# Patient Record
Sex: Female | Born: 1940
Health system: Southern US, Community
[De-identification: ages and names within clinical notes are randomized; demographics above are authoritative.]

## PROBLEM LIST (undated history)

## (undated) DIAGNOSIS — R011 Cardiac murmur, unspecified: Secondary | ICD-10-CM

## (undated) DIAGNOSIS — I2510 Atherosclerotic heart disease of native coronary artery without angina pectoris: Secondary | ICD-10-CM

## (undated) DIAGNOSIS — C801 Malignant (primary) neoplasm, unspecified: Secondary | ICD-10-CM

## (undated) DIAGNOSIS — K219 Gastro-esophageal reflux disease without esophagitis: Secondary | ICD-10-CM

## (undated) DIAGNOSIS — I10 Essential (primary) hypertension: Secondary | ICD-10-CM

## (undated) DIAGNOSIS — E039 Hypothyroidism, unspecified: Secondary | ICD-10-CM

## (undated) DIAGNOSIS — E785 Hyperlipidemia, unspecified: Secondary | ICD-10-CM

## (undated) DIAGNOSIS — M1990 Unspecified osteoarthritis, unspecified site: Secondary | ICD-10-CM

## (undated) HISTORY — DX: Gastro-esophageal reflux disease without esophagitis: K21.9

## (undated) HISTORY — DX: Cardiac murmur, unspecified: R01.1

## (undated) HISTORY — DX: Hyperlipidemia, unspecified: E78.5

## (undated) HISTORY — DX: Malignant (primary) neoplasm, unspecified: C80.1

## (undated) HISTORY — DX: Atherosclerotic heart disease of native coronary artery without angina pectoris: I25.10

## (undated) HISTORY — DX: Unspecified osteoarthritis, unspecified site: M19.90

## (undated) HISTORY — DX: Hypothyroidism, unspecified: E03.9

## (undated) HISTORY — DX: Essential (primary) hypertension: I10

---

## 1998-10-09 HISTORY — PX: EYE SURGERY: SHX253

## 2004-10-09 HISTORY — PX: CATARACT EXTRACTION: SUR2

## 2009-08-18 LAB — HM DEXA SCAN

## 2009-09-08 HISTORY — PX: CARDIAC CATHETERIZATION: SHX172

## 2009-10-06 ENCOUNTER — Inpatient Hospital Stay (HOSPITAL_COMMUNITY): Admission: EM | Admit: 2009-10-06 | Discharge: 2009-10-08 | Payer: Self-pay | Admitting: Emergency Medicine

## 2009-10-06 ENCOUNTER — Ambulatory Visit: Payer: Self-pay | Admitting: Internal Medicine

## 2009-10-06 IMAGING — CR DG CHEST 1V PORT
1 series · 1 of 1 positions shown · non-contrast
Comparison: None

CLINICAL DATA: Chest pain

PORTABLE CHEST - 1 VIEW

[view not recorded]
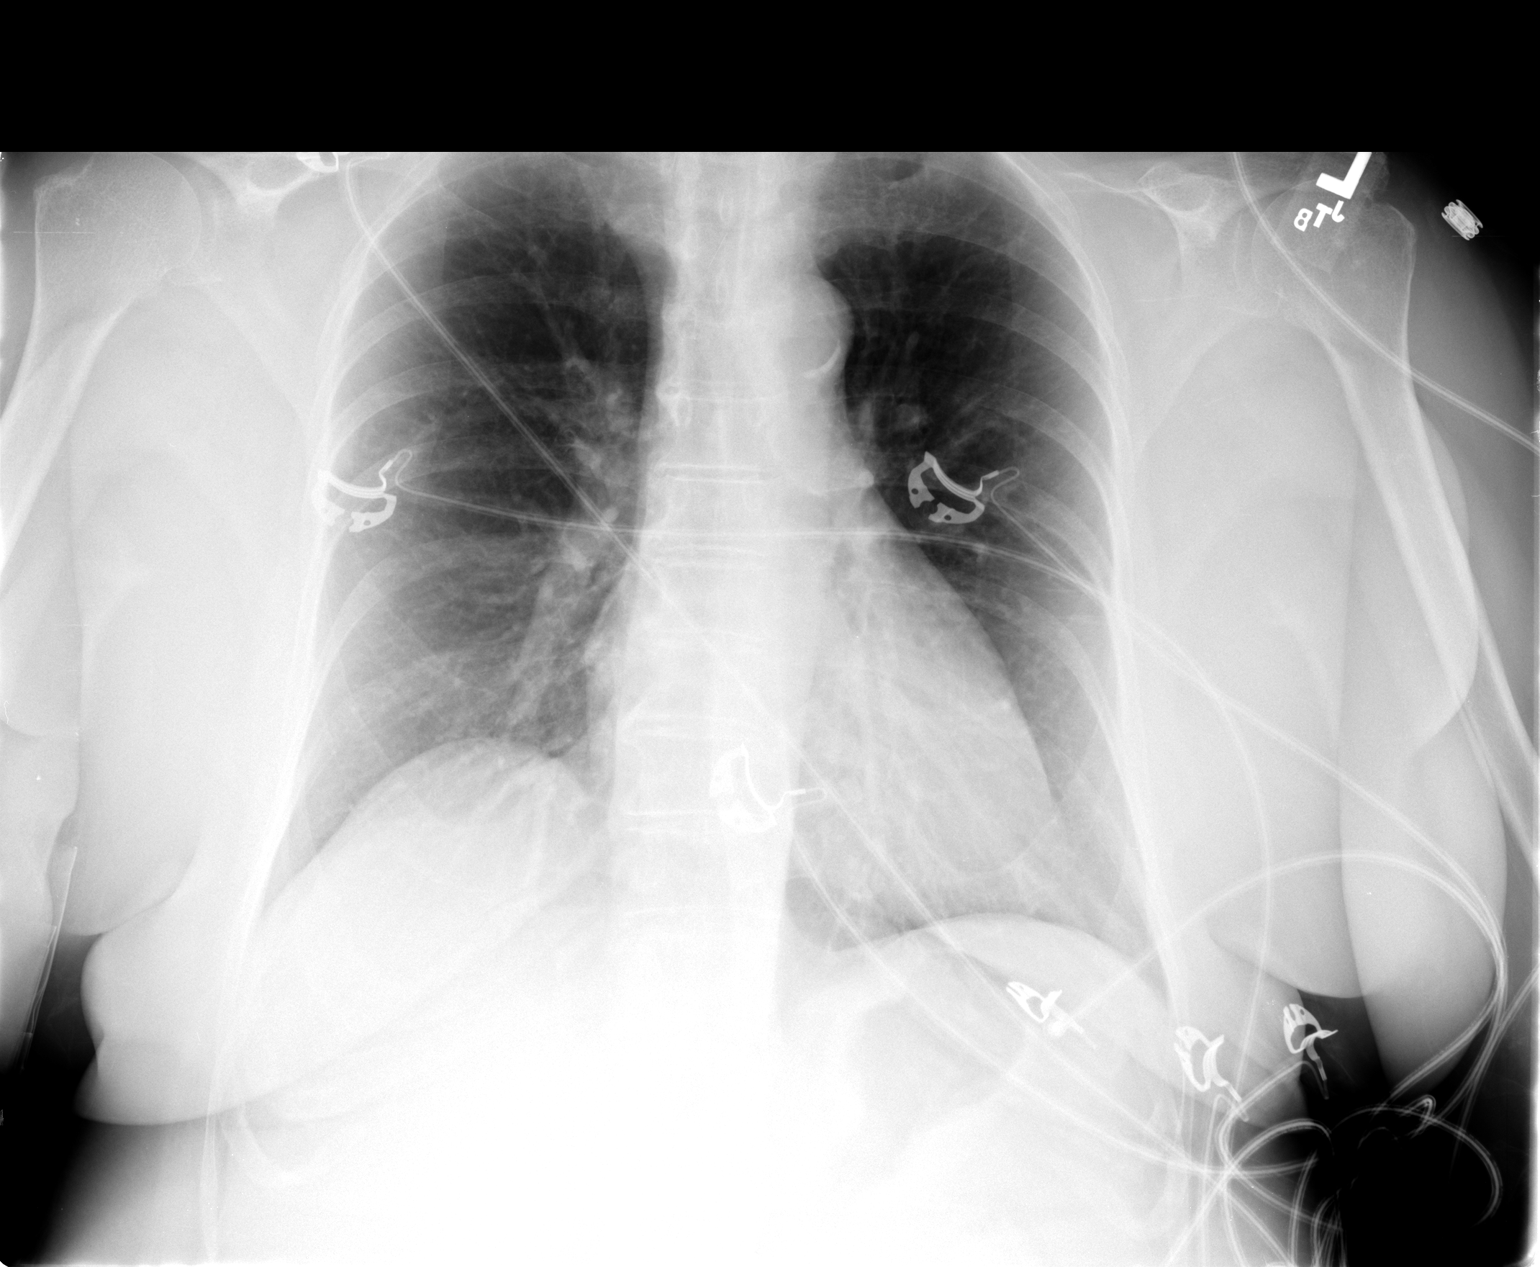

[1 of 1 positions shown; findings below may reference images not displayed]

FINDINGS: Heart and mediastinal contours are within normal limits.
No focal opacities or effusions.  No acute bony abnormality.
IMPRESSION: No active disease.

## 2009-10-07 ENCOUNTER — Encounter: Payer: Self-pay | Admitting: Cardiology

## 2009-10-09 DIAGNOSIS — C801 Malignant (primary) neoplasm, unspecified: Secondary | ICD-10-CM

## 2009-10-09 HISTORY — DX: Malignant (primary) neoplasm, unspecified: C80.1

## 2009-10-25 LAB — HM PAP SMEAR

## 2009-12-07 DIAGNOSIS — E039 Hypothyroidism, unspecified: Secondary | ICD-10-CM | POA: Insufficient documentation

## 2009-12-08 ENCOUNTER — Ambulatory Visit: Payer: Self-pay | Admitting: Cardiology

## 2009-12-08 DIAGNOSIS — E785 Hyperlipidemia, unspecified: Secondary | ICD-10-CM | POA: Insufficient documentation

## 2009-12-08 DIAGNOSIS — I10 Essential (primary) hypertension: Secondary | ICD-10-CM | POA: Insufficient documentation

## 2009-12-08 DIAGNOSIS — I2510 Atherosclerotic heart disease of native coronary artery without angina pectoris: Secondary | ICD-10-CM

## 2010-04-19 HISTORY — PX: BREAST LUMPECTOMY: SHX2

## 2010-11-08 NOTE — Assessment & Plan Note (Signed)
Summary: [facility]   Visit Type:  Follow-up Primary Provider:  Belva Agee, NP  CC:  Nonobstructive CAD.  History of Present Illness: The patient presents for followup after a hospitalization in late last year. She had an abnormal exercise treadmill test. However, catheterization demonstrated only mild nonobstructive disease. Since that time she has had no tarry or vascular complaints. She has had no chest discomfort, neck or arm discomfort. She has had no palpitations, presyncope or syncope. She will get short of breath climbing a flight of stairs but this has been chronic. She doesn't describe any resting shortness of breath and has no PND or orthopnea.  Current Medications (verified): 1)  Synthroid 75 Mcg Tabs (Levothyroxine Sodium) .Marland Kitchen.. 1 By Mouth Daily 2)  Simvastatin 40 Mg Tabs (Simvastatin) .Marland Kitchen.. 1 By Mouth Daily 3)  Verapamil Hcl 120 Mg Tabs (Verapamil Hcl) .Marland Kitchen.. 1 By Mouth Daily 4)  Lisinopril 10 Mg Tabs (Lisinopril) .Marland Kitchen.. 1 By Mouth Daily 5)  Omeprazole 40 Mg Cpdr (Omeprazole) .Marland Kitchen.. 1 By Mouth Daily 6)  Nitrostat 0.4 Mg Subl (Nitroglycerin) .... As Needed 7)  Vitamin D3 1000 Unit Caps (Cholecalciferol) .Marland Kitchen.. 1 By Mouth Daily 8)  Aspirin 325 Mg  Tabs (Aspirin) .Marland Kitchen.. 1 By Mouth Daily 9)  Multivitamins   Tabs (Multiple Vitamin) .Marland Kitchen.. 1 By Mouth Daiy  Allergies (verified): 1)  ! Penicillin 2)  ! Sulfa 3)  ! * Nickle 4)  ! * Latex  Past History:  Past Medical History: Hypothyroidism Non obstructive CAD (cath 12/10) Hyperlipidemia  Hypertension  Past Surgical History: Cataract surgery  Family History:  Mother with "heart problem" age 27s.  Died at age 37.  Father emphysema.      Review of Systems       As stated in the HPI and negative for all other systems.   Vital Signs:  Patient profile:   70 year old female Height:      60 inches Weight:      160 pounds BMI:     31.36 Pulse rate:   69 / minute Resp:     16 per minute BP sitting:   138 / 80  (right  arm)  Vitals Entered By: Marrion Coy, CNA (December 08, 2009 1:12 PM)  Physical Exam  General:  Well developed, well nourished, in no acute distress. Head:  normocephalic and atraumatic Eyes:  PERRLA/EOM intact; conjunctiva and lids normal. Mouth:  Teeth, gums and palate normal. Oral mucosa normal. Neck:  Neck supple, no JVD. No masses, thyromegaly or abnormal cervical nodes. Chest Wall:  no deformities or breast masses noted Lungs:  Clear bilaterally to auscultation and percussion. Abdomen:  Bowel sounds positive; abdomen soft and non-tender without masses, organomegaly, or hernias noted. No hepatosplenomegaly. Msk:  Back normal, normal gait. Muscle strength and tone normal. Extremities:  No clubbing or cyanosis. Neurologic:  Alert and oriented x 3. Skin:  Intact without lesions or rashes. Cervical Nodes:  no significant adenopathy Axillary Nodes:  no significant adenopathy Inguinal Nodes:  no significant adenopathy Psych:  Normal affect.   Detailed Cardiovascular Exam  Neck    Carotids: Carotids full and equal bilaterally without bruits.      Neck Veins: Normal, no JVD.    Heart    Inspection: no deformities or lifts noted.      Palpation: normal PMI with no thrills palpable.      Auscultation: regular rate and rhythm, S1, S2 without murmurs, rubs, gallops, or clicks.    Vascular  Abdominal Aorta: no palpable masses, pulsations, or audible bruits.      Femoral Pulses: normal femoral pulses bilaterally.      Pedal Pulses: normal pedal pulses bilaterally.      Radial Pulses: normal radial pulses bilaterally.      Peripheral Circulation: no clubbing, cyanosis, or edema noted with normal capillary refill.     EKG  Procedure date:  12/08/2009  Findings:      sinus rhythm with sinus arrhythmia, axis within normal limits, intervals within normal limits, no acute ST-T wave changes.  Impression & Recommendations:  Problem # 1:  UNSPECIFIED CIRCULATORY DISEASE  (ICD-V12.50) The patient had some mild nonobstructive coronary disease. At this point only primary risk reduction is indicated. No further testing is needed. We did discuss the need to add walking to her primary prevention strategy.  Problem # 2:  ESSENTIAL HYPERTENSION, BENIGN (ICD-401.1) Her blood pressure is controlled. She will continue the meds as listed.  Problem # 3:  DYSLIPIDEMIA (ICD-272.4) I reviewed her lipid profile from January 17 of this year. Her LDL was only 68 with an HDL of 66. I will defer management to her primary team.

## 2010-11-08 NOTE — Miscellaneous (Signed)
Summary: Orders Update  Clinical Lists Changes  Orders: Added new Service order of EKG w/ Interpretation (93000) - Signed 

## 2010-12-24 DIAGNOSIS — C50919 Malignant neoplasm of unspecified site of unspecified female breast: Secondary | ICD-10-CM | POA: Insufficient documentation

## 2011-01-09 LAB — DIFFERENTIAL
Basophils Relative: 0 % (ref 0–1)
Lymphocytes Relative: 17 % (ref 12–46)
Lymphs Abs: 1.4 10*3/uL (ref 0.7–4.0)
Monocytes Absolute: 0.4 10*3/uL (ref 0.1–1.0)
Monocytes Relative: 4 % (ref 3–12)
Neutro Abs: 6.4 10*3/uL (ref 1.7–7.7)

## 2011-01-09 LAB — LIPID PANEL
Cholesterol: 212 mg/dL — ABNORMAL HIGH (ref 0–200)
HDL: 65 mg/dL (ref >39)
LDL Cholesterol: 127 mg/dL — ABNORMAL HIGH (ref 0–99)
Total CHOL/HDL Ratio: 3.3 RATIO

## 2011-01-09 LAB — CBC
HCT: 38.3 % (ref 36.0–46.0)
Hemoglobin: 12.3 g/dL (ref 12.0–15.0)
Hemoglobin: 14.6 g/dL (ref 12.0–15.0)
MCHC: 34.7 g/dL (ref 30.0–36.0)
MCHC: 35.4 g/dL (ref 30.0–36.0)
MCHC: 35.5 g/dL (ref 30.0–36.0)
MCV: 94.5 fL (ref 78.0–100.0)
MCV: 94.9 fL (ref 78.0–100.0)
MCV: 95.1 fL (ref 78.0–100.0)
Platelets: 241 10*3/uL (ref 150–400)
Platelets: 254 10*3/uL (ref 150–400)
RBC: 3.67 MIL/uL — ABNORMAL LOW (ref 3.87–5.11)
RBC: 4.03 MIL/uL (ref 3.87–5.11)
RBC: 4.41 MIL/uL (ref 3.87–5.11)
RDW: 13.3 % (ref 11.5–15.5)
RDW: 13.5 % (ref 11.5–15.5)
WBC: 10.5 10*3/uL (ref 4.0–10.5)
WBC: 8.3 10*3/uL (ref 4.0–10.5)

## 2011-01-09 LAB — CK TOTAL AND CKMB (NOT AT ARMC): CK, MB: 1.6 ng/mL (ref 0.3–4.0)

## 2011-01-09 LAB — BASIC METABOLIC PANEL
CO2: 27 mEq/L (ref 19–32)
Calcium: 9.2 mg/dL (ref 8.4–10.5)
Chloride: 108 mEq/L (ref 96–112)
Creatinine, Ser: 0.72 mg/dL (ref 0.4–1.2)
GFR calc Af Amer: >60 mL/min (ref >60)
GFR calc non Af Amer: >60 mL/min (ref >60)
Glucose, Bld: 122 mg/dL — ABNORMAL HIGH (ref 70–99)
Sodium: 138 mEq/L (ref 135–145)
Sodium: 142 mEq/L (ref 135–145)

## 2011-01-09 LAB — CARDIAC PANEL(CRET KIN+CKTOT+MB+TROPI)
CK, MB: 1.2 ng/mL (ref 0.3–4.0)
CK, MB: 1.4 ng/mL (ref 0.3–4.0)
Total CK: 43 U/L (ref 7–177)

## 2011-01-09 LAB — URINALYSIS, ROUTINE W REFLEX MICROSCOPIC
Bilirubin Urine: NEGATIVE
Ketones, ur: NEGATIVE mg/dL
Nitrite: NEGATIVE
pH: 7.5 (ref 5.0–8.0)

## 2011-01-09 LAB — HEPARIN LEVEL (UNFRACTIONATED)

## 2011-01-09 LAB — PROTIME-INR
INR: 0.94 (ref 0.00–1.49)
Prothrombin Time: 12.5 seconds (ref 11.6–15.2)

## 2011-01-09 LAB — URINE MICROSCOPIC-ADD ON

## 2011-01-09 LAB — APTT: aPTT: 26 seconds (ref 24–37)

## 2011-05-16 ENCOUNTER — Encounter: Payer: Self-pay | Admitting: Cardiology

## 2011-09-19 DIAGNOSIS — Z86000 Personal history of in-situ neoplasm of breast: Secondary | ICD-10-CM | POA: Insufficient documentation

## 2011-09-19 DIAGNOSIS — D0511 Intraductal carcinoma in situ of right breast: Secondary | ICD-10-CM | POA: Insufficient documentation

## 2012-04-25 ENCOUNTER — Encounter: Payer: Self-pay | Admitting: Internal Medicine

## 2012-06-07 ENCOUNTER — Encounter: Payer: Self-pay | Admitting: Internal Medicine

## 2012-10-22 ENCOUNTER — Encounter: Payer: Self-pay | Admitting: Internal Medicine

## 2012-11-15 ENCOUNTER — Ambulatory Visit (AMBULATORY_SURGERY_CENTER): Payer: Medicare Other | Admitting: *Deleted

## 2012-11-15 VITALS — Ht 60.0 in | Wt 167.0 lb

## 2012-11-15 DIAGNOSIS — Z1211 Encounter for screening for malignant neoplasm of colon: Secondary | ICD-10-CM

## 2012-11-15 MED ORDER — MOVIPREP 100 G PO SOLR: ORAL | Status: DC

## 2012-11-18 ENCOUNTER — Encounter: Payer: Self-pay | Admitting: Internal Medicine

## 2012-11-25 ENCOUNTER — Ambulatory Visit (AMBULATORY_SURGERY_CENTER): Payer: Medicare Other | Admitting: Internal Medicine

## 2012-11-25 ENCOUNTER — Encounter: Payer: Self-pay | Admitting: Internal Medicine

## 2012-11-25 VITALS — BP 153/71 | HR 74 | Temp 97.6°F | Resp 17 | Ht 60.0 in | Wt 167.0 lb

## 2012-11-25 DIAGNOSIS — D126 Benign neoplasm of colon, unspecified: Secondary | ICD-10-CM

## 2012-11-25 DIAGNOSIS — Z1211 Encounter for screening for malignant neoplasm of colon: Secondary | ICD-10-CM

## 2012-11-25 MED ORDER — SODIUM CHLORIDE 0.9 % IV SOLN: 500.0000 mL | INTRAVENOUS | Status: DC

## 2012-11-25 NOTE — Patient Instructions (Addendum)

## 2012-11-25 NOTE — Op Note (Signed)
[facility] [address] [place], [address]   COLONOSCOPY PROCEDURE REPORT  PATIENT: Bonnie Tanner, [facility] Tanner  MR#: [id number] BIRTHDATE: 1941-07-01 , 71  yrs. old GENDER: Female ENDOSCOPIST: Beverley Fiedler, MD REFERRED XB:JYNWG, Donald PROCEDURE DATE:  11/25/2012 PROCEDURE:   Colonoscopy with snare polypectomy ASA CLASS:   Class II INDICATIONS:average risk screening and first colonoscopy. MEDICATIONS: MAC sedation, administered by CRNA and propofol (Diprivan) 400mg  IV  DESCRIPTION OF PROCEDURE:   After the risks benefits and alternatives of the procedure were thoroughly explained, informed consent was obtained.  A digital rectal exam revealed no rectal mass.   The LB [id number]-[id number]  endoscope was introduced through the anus and advanced to the cecum, which was identified by both the appendix and ileocecal valve. No adverse events experienced. The quality of the prep was good, using MoviPrep  The instrument was then slowly withdrawn as the colon was fully examined.  COLON FINDINGS: A sessile polyp measuring 5 mm in size was found in the transverse colon.  A polypectomy was performed with a cold snare.  The resection was complete and the polyp tissue was completely retrieved.   A sessile polyp measuring 8 mm in size was found in the descending colon.  A polypectomy was performed using snare cautery.  The resection was complete and the polyp tissue was completely retrieved.   Moderate diverticulosis was noted in the descending colon and sigmoid colon.  Retroflexed views revealed no abnormalities. The time to cecum=6 minutes 23 seconds.  Withdrawal time=11 minutes 35 seconds.  The scope was withdrawn and the procedure completed. COMPLICATIONS: There were no complications.  ENDOSCOPIC IMPRESSION: 1.   Sessile polyp measuring 5 mm in size was found in the transverse colon; polypectomy was performed with a cold snare 2.   Sessile polyp measuring 8 mm in size was found in  the descending colon; polypectomy was performed using snare cautery 3.   Moderate diverticulosis was noted in the descending colon and sigmoid colon  RECOMMENDATIONS: 1.  Hold aspirin, aspirin products, and anti-inflammatory medication for 1 week. 2.  Await pathology results 3.  High fiber diet 4.  If the polyps removed today are proven to be adenomatous (pre-cancerous) polyps, you will need a repeat colonoscopy in 5 years.  Otherwise you should continue to follow colorectal cancer screening guidelines for "routine risk" patients with colonoscopy in 10 years.  You will receive a letter within 1-2 weeks with the results of your biopsy as well as final recommendations.  Please call my office if you have not received a letter after 3 weeks.  eSigned:  Beverley Fiedler, MD 11/25/2012 2:38 PM      cc: Rudi Heap, MD and The Patient

## 2012-11-25 NOTE — Progress Notes (Signed)
Patient did not experience any of the following events: a burn prior to discharge; a fall within the facility; wrong site/side/patient/procedure/implant event; or a hospital transfer or hospital admission upon discharge from the facility. (G8907) Patient did not have preoperative order for IV antibiotic SSI prophylaxis. ([id number])  

## 2012-11-26 ENCOUNTER — Telehealth: Payer: Self-pay | Admitting: *Deleted

## 2012-11-26 NOTE — Telephone Encounter (Signed)
  Follow up Call-  Call back number 11/25/2012  Post procedure Call Back phone  # [phone]  Permission to leave phone message Yes     Patient questions:  Do you have a fever, pain , or abdominal swelling? no Pain Score  0 *  Have you tolerated food without any problems? yes  Have you been able to return to your normal activities? yes  Do you have any questions about your discharge instructions: Diet   no Medications  no Follow up visit  no  Do you have questions or concerns about your Care? no  Actions: * If pain score is 4 or above: No action needed, pain <4.

## 2012-11-28 ENCOUNTER — Encounter: Payer: Self-pay | Admitting: Internal Medicine

## 2012-11-29 ENCOUNTER — Encounter: Payer: Self-pay | Admitting: Internal Medicine

## 2012-12-20 ENCOUNTER — Encounter: Payer: Self-pay | Admitting: Nurse Practitioner

## 2012-12-24 ENCOUNTER — Telehealth: Payer: Self-pay | Admitting: General Practice

## 2012-12-24 NOTE — Telephone Encounter (Signed)
Still sick, wants Z-Pak

## 2012-12-25 ENCOUNTER — Other Ambulatory Visit: Payer: Self-pay | Admitting: General Practice

## 2012-12-25 ENCOUNTER — Telehealth: Payer: Self-pay | Admitting: General Practice

## 2012-12-25 DIAGNOSIS — J069 Acute upper respiratory infection, unspecified: Secondary | ICD-10-CM

## 2012-12-25 MED ORDER — DOXYCYCLINE HYCLATE 100 MG PO TABS: 100.0000 mg | ORAL_TABLET | Freq: Two times a day (BID) | ORAL | Status: AC

## 2012-12-25 NOTE — Telephone Encounter (Signed)
Doxycycline called into [facility] pharmacy. Will have nurse call and inform patient.

## 2012-12-27 NOTE — Telephone Encounter (Signed)
Pharmacy contacted.

## 2013-02-01 ENCOUNTER — Other Ambulatory Visit: Payer: Self-pay | Admitting: Nurse Practitioner

## 2013-04-30 ENCOUNTER — Other Ambulatory Visit: Payer: Self-pay | Admitting: Nurse Practitioner

## 2013-05-05 ENCOUNTER — Telehealth: Payer: Self-pay | Admitting: Nurse Practitioner

## 2013-05-05 MED ORDER — VERAPAMIL HCL 120 MG PO TABS: 120.0000 mg | ORAL_TABLET | Freq: Every day | ORAL | Status: DC

## 2013-05-05 NOTE — Telephone Encounter (Signed)
rx sent to pharmacy

## 2013-05-05 NOTE — Telephone Encounter (Signed)
Plain verapamil 120. Same that was ordered in april

## 2013-05-05 NOTE — Telephone Encounter (Signed)
Please advise 

## 2013-05-05 NOTE — Telephone Encounter (Signed)
Which verapamil is she on epic has two different ones

## 2013-05-06 NOTE — Telephone Encounter (Signed)
Patient aware rx sent into pharmacy. 

## 2013-06-04 ENCOUNTER — Ambulatory Visit (INDEPENDENT_AMBULATORY_CARE_PROVIDER_SITE_OTHER): Payer: Medicare Other | Admitting: Nurse Practitioner

## 2013-06-04 ENCOUNTER — Encounter: Payer: Self-pay | Admitting: Nurse Practitioner

## 2013-06-04 VITALS — BP 132/71 | HR 69 | Temp 98.1°F | Ht 60.0 in | Wt 165.0 lb

## 2013-06-04 DIAGNOSIS — I10 Essential (primary) hypertension: Secondary | ICD-10-CM

## 2013-06-04 DIAGNOSIS — E785 Hyperlipidemia, unspecified: Secondary | ICD-10-CM

## 2013-06-04 DIAGNOSIS — E079 Disorder of thyroid, unspecified: Secondary | ICD-10-CM

## 2013-06-04 DIAGNOSIS — K219 Gastro-esophageal reflux disease without esophagitis: Secondary | ICD-10-CM

## 2013-06-04 MED ORDER — VERAPAMIL HCL 120 MG PO TABS: 120.0000 mg | ORAL_TABLET | Freq: Every day | ORAL | Status: DC

## 2013-06-04 MED ORDER — LISINOPRIL 40 MG PO TABS: 40.0000 mg | ORAL_TABLET | Freq: Every day | ORAL | Status: DC

## 2013-06-04 MED ORDER — LEVOTHYROXINE SODIUM 75 MCG PO TABS: 75.0000 ug | ORAL_TABLET | Freq: Every day | ORAL | Status: DC

## 2013-06-04 MED ORDER — OMEPRAZOLE 40 MG PO CPDR: 40.0000 mg | DELAYED_RELEASE_CAPSULE | Freq: Every day | ORAL | Status: DC

## 2013-06-04 NOTE — Patient Instructions (Signed)

## 2013-06-04 NOTE — Progress Notes (Signed)
Subjective:    Patient ID: [facility], female    DOB: 1941-01-13, 72 y.o.   MRN: [id number]  Hypertension This is a chronic problem. The current episode started more than 1 year ago. The problem is unchanged. The problem is controlled. Pertinent negatives include no blurred vision, chest pain, headaches, neck pain, orthopnea, palpitations, peripheral edema or shortness of breath. There are no associated agents to hypertension. Risk factors for coronary artery disease include dyslipidemia, family history, obesity and post-menopausal state. Past treatments include ACE inhibitors and beta blockers. The current treatment provides moderate improvement. Compliance problems include exercise and diet.  Hypertensive end-organ damage includes a thyroid problem.  Hyperlipidemia This is a chronic problem. The current episode started more than 1 year ago. The problem is uncontrolled. Recent lipid tests were reviewed and are high. Exacerbating diseases include obesity. She has no history of diabetes or hypothyroidism. There are no known factors aggravating her hyperlipidemia. Pertinent negatives include no chest pain, leg pain, myalgias or shortness of breath. Treatments tried: was on crestor and caused leg pain. The current treatment provides mild improvement of lipids. Compliance problems include adherence to diet and adherence to exercise.  Risk factors for coronary artery disease include family history, hypertension, obesity and post-menopausal.  Thyroid Problem Presents for follow-up (hypothyroidim) visit. Patient reports no cold intolerance, depressed mood, diaphoresis, diarrhea, dry skin, heat intolerance, hoarse voice, palpitations, visual change, weight gain or weight loss. The symptoms have been stable. Her past medical history is significant for hyperlipidemia. There is no history of diabetes.  gerd Omeprazole works well- keeps symptoms under control   Review of Systems  Constitutional: Negative for  weight loss, weight gain and diaphoresis.  HENT: Negative for hoarse voice and neck pain.   Eyes: Negative for blurred vision.  Respiratory: Negative for shortness of breath.   Cardiovascular: Negative for chest pain, palpitations and orthopnea.  Gastrointestinal: Negative for diarrhea.  Endocrine: Negative for cold intolerance and heat intolerance.  Musculoskeletal: Negative for myalgias.  Neurological: Negative for headaches.  All other systems reviewed and are negative.       Objective:   Physical Exam  Constitutional: She is oriented to person, place, and time. She appears well-developed and well-nourished.  HENT:  Nose: Nose normal.  Mouth/Throat: Oropharynx is clear and moist.  Eyes: EOM are normal.  Neck: Trachea normal, normal range of motion and full passive range of motion without pain. Neck supple. No JVD present. Carotid bruit is not present. No thyromegaly present.  Cardiovascular: Normal rate, regular rhythm, normal heart sounds and intact distal pulses.  Exam reveals no gallop and no friction rub.   No murmur heard. Pulmonary/Chest: Effort normal and breath sounds normal.  Abdominal: Soft. Bowel sounds are normal. She exhibits no distension and no mass. There is no tenderness.  Musculoskeletal: Normal range of motion.  Lymphadenopathy:    She has no cervical adenopathy.  Neurological: She is alert and oriented to person, place, and time. She has normal reflexes.  Skin: Skin is warm and dry.  Psychiatric: She has a normal mood and affect. Her behavior is normal. Judgment and thought content normal.   BP 132/71  Pulse 69  Temp(Src) 98.1 F (36.7 C) (Oral)  Ht 5' (1.524 m)  Wt 165 lb (74.844 kg)  BMI 32.22 kg/m2        Assessment & Plan:  1. THYROID DISORDER  - Thyroid Panel With TSH - levothyroxine (SYNTHROID, LEVOTHROID) 75 MCG tablet; Take 1 tablet (75 mcg total) by mouth  daily before breakfast.  Dispense: 90 tablet; Refill: 1  2. DYSLIPIDEMIA Low  fat diet and exercise - NMR, lipoprofile  3. Essential hypertension, benign Low NA diet - CMP14+EGFR - verapamil (CALAN) 120 MG tablet; Take 1 tablet (120 mg total) by mouth daily.  Dispense: 90 tablet; Refill: 1 - lisinopril (PRINIVIL,ZESTRIL) 40 MG tablet; Take 1 tablet (40 mg total) by mouth daily.  Dispense: 90 tablet; Refill: 1  4. GERD (gastroesophageal reflux disease) Avoid spicy and fatty foods - omeprazole (PRILOSEC) 40 MG capsule; Take 1 capsule (40 mg total) by mouth daily.  Dispense: 90 capsule; Refill: 1  heath maintenance reviewed Patient will make appointment for mammogram  Mary-Margaret Daphine Deutscher, FNP

## 2013-06-06 ENCOUNTER — Other Ambulatory Visit: Payer: Self-pay | Admitting: Nurse Practitioner

## 2013-06-06 LAB — CMP14+EGFR
ALT: 26 IU/L (ref 0–32)
AST: 24 IU/L (ref 0–40)
Albumin/Globulin Ratio: 1.8 (ref 1.1–2.5)
Alkaline Phosphatase: 111 IU/L (ref 39–117)
BUN/Creatinine Ratio: 21 (ref 11–26)
Chloride: 100 mmol/L (ref 97–108)
GFR calc Af Amer: 68 mL/min/{1.73_m2} (ref >59)
GFR calc non Af Amer: 59 mL/min/{1.73_m2} — ABNORMAL LOW (ref >59)
Potassium: 4.5 mmol/L (ref 3.5–5.2)
Sodium: 140 mmol/L (ref 134–144)
Total Bilirubin: 0.3 mg/dL (ref 0.0–1.2)

## 2013-06-06 LAB — THYROID PANEL WITH TSH
T4, Total: 10 ug/dL (ref 4.5–12.0)
TSH: 1.46 u[IU]/mL (ref 0.450–4.500)

## 2013-06-06 LAB — NMR, LIPOPROFILE
LDL Size: 21.1 nm (ref >20.5)
LP-IR Score: 37 (ref <=45)
Small LDL Particle Number: 770 nmol/L — ABNORMAL HIGH (ref <=527)
Triglycerides by NMR: 113 mg/dL (ref <150)

## 2013-06-06 MED ORDER — ATORVASTATIN CALCIUM 40 MG PO TABS: 40.0000 mg | ORAL_TABLET | Freq: Every day | ORAL | Status: DC

## 2013-06-11 ENCOUNTER — Telehealth: Payer: Self-pay | Admitting: Nurse Practitioner

## 2013-06-12 NOTE — Telephone Encounter (Signed)
For what- I need to know what for in order to fill

## 2013-06-13 MED ORDER — ERYTHROMYCIN 5 MG/GM OP OINT: TOPICAL_OINTMENT | OPHTHALMIC | Status: DC

## 2013-06-13 NOTE — Telephone Encounter (Signed)
Patient was seen on 11-28-10 for a stye on right eye per chart. Was given erythromycin ointment 2cm ribbon to right eye QID.

## 2013-09-03 ENCOUNTER — Ambulatory Visit (INDEPENDENT_AMBULATORY_CARE_PROVIDER_SITE_OTHER): Payer: Medicare Other | Admitting: General Practice

## 2013-09-03 ENCOUNTER — Telehealth: Payer: Self-pay | Admitting: Nurse Practitioner

## 2013-09-03 VITALS — BP 147/76 | HR 73 | Temp 98.7°F | Wt 167.0 lb

## 2013-09-03 DIAGNOSIS — H0011 Chalazion right upper eyelid: Secondary | ICD-10-CM

## 2013-09-03 DIAGNOSIS — H0019 Chalazion unspecified eye, unspecified eyelid: Secondary | ICD-10-CM

## 2013-09-03 MED ORDER — NEOMYCIN-POLYMYXIN-HC 3.5-10000-1 OP SUSP: 3.0000 [drp] | Freq: Four times a day (QID) | OPHTHALMIC | Status: DC

## 2013-09-03 MED ORDER — BACITRA-NEOMYCIN-POLYMYXIN-HC 1 % OP OINT: 1.0000 "application " | TOPICAL_OINTMENT | Freq: Two times a day (BID) | OPHTHALMIC | Status: AC

## 2013-09-03 NOTE — Telephone Encounter (Signed)
Aware. 

## 2013-09-03 NOTE — Telephone Encounter (Signed)
Pt having reaction to eye ointment Swelling with redness Scheduled appt for pt to be seen

## 2013-09-03 NOTE — Progress Notes (Signed)
   Subjective:    Patient ID: [facility], female    DOB: 07-17-1941, 72 y.o.   MRN: [id number]  HPI Presents today with complaints of right upper eyelid swelling. Reports onset was 2-3 weeks ago. Reports using a medications that was given to her in September (erythromycin OP ointment). Reports eye seemed worse with use of medication. Patient denies being seen prior to use of medication.     Review of Systems  Constitutional: Negative for fever and chills.  Eyes: Negative for photophobia, itching and visual disturbance.       Right upper eyelid swelling  Respiratory: Negative for chest tightness and shortness of breath.   Cardiovascular: Negative for chest pain and palpitations.       Objective:   Physical Exam  Constitutional: She is oriented to person, place, and time. She appears well-developed and well-nourished.  Eyes: EOM are normal. Pupils are equal, round, and reactive to light. Right conjunctiva is not injected. Right conjunctiva has no hemorrhage.  Chalazion noted to right upper eyelid  Cardiovascular: Normal rate, regular rhythm and normal heart sounds.   Pulmonary/Chest: Effort normal and breath sounds normal. No respiratory distress. She exhibits no tenderness.  Neurological: She is alert and oriented to person, place, and time.  Skin: Skin is warm and dry.  Psychiatric: She has a normal mood and affect.          Assessment & Plan:  1. Chalazion of right upper eyelid - bacitracin-neomycin-polymyxin-hydrocortisone (CORTISPORIN) 1 % ophthalmic ointment; Place 1 application into the right eye 2 (two) times daily.  Dispense: 3.5 g; Refill: 0 -Patient to schedule for regular eye exam -Cleanse affected eyelid with baby shampoo -provided and discussed information on Chalazion RTO if symptoms worsen or unresolved, will refer to Optometrist Patient verbalized understanding Coralie Keens, FNP-C

## 2013-09-03 NOTE — Telephone Encounter (Signed)
New x sent to pharmacy

## 2013-09-03 NOTE — Patient Instructions (Signed)
Chalazion  A chalazion is a swelling or hard lump on the eyelid caused by a blocked oil gland. Chalazions may occur on the upper or the lower eyelid.   CAUSES   Oil gland in the eyelid becomes blocked.  SYMPTOMS   · Swelling or hard lump on the eyelid. This lump may make it hard to see out of the eye.  · The swelling may spread to areas around the eye.  TREATMENT   · Although some chalazions disappear by themselves in 1 or 2 months, some chalazions may need to be removed.  · Medicines to treat an infection may be required.  HOME CARE INSTRUCTIONS   · Wash your hands often and dry them with a clean towel. Do not touch the chalazion.  · Apply heat to the eyelid several times a day for 10 minutes to help ease discomfort and bring any yellowish white fluid (pus) to the surface. One way to apply heat to a chalazion is to use the handle of a metal spoon.  · Hold the handle under hot water until it is hot, and then wrap the handle in paper towels so that the heat can come through without burning your skin.  · Hold the wrapped handle against the chalazion and reheat the spoon handle as needed.  · Apply heat in this fashion for 10 minutes, 4 times per day.  · Return to your caregiver to have the pus removed if it does not break (rupture) on its own.  · Do not try to remove the pus yourself by squeezing the chalazion or sticking it with a pin or needle.  · Only take over-the-counter or prescription medicines for pain, discomfort, or fever as directed by your caregiver.  SEEK IMMEDIATE MEDICAL CARE IF:   · You have pain in your eye.  · Your vision changes.  · The chalazion does not go away.  · The chalazion becomes painful, red, or swollen, grows larger, or does not start to disappear after 2 weeks.  MAKE SURE YOU:   · Understand these instructions.  · Will watch your condition.  · Will get help right away if you are not doing well or get worse.  Document Released: 09/22/2000 Document Revised: 12/18/2011 Document Reviewed:  01/10/2010  ExitCare® Patient Information ©2014 [employer].

## 2013-09-08 ENCOUNTER — Ambulatory Visit: Payer: Medicare Other

## 2013-09-15 ENCOUNTER — Ambulatory Visit: Payer: Medicare Other

## 2013-10-06 ENCOUNTER — Encounter: Payer: Self-pay | Admitting: General Practice

## 2013-10-06 ENCOUNTER — Ambulatory Visit (INDEPENDENT_AMBULATORY_CARE_PROVIDER_SITE_OTHER): Payer: Medicare Other | Admitting: General Practice

## 2013-10-06 ENCOUNTER — Ambulatory Visit (INDEPENDENT_AMBULATORY_CARE_PROVIDER_SITE_OTHER): Payer: Medicare Other

## 2013-10-06 ENCOUNTER — Encounter (INDEPENDENT_AMBULATORY_CARE_PROVIDER_SITE_OTHER): Payer: Self-pay

## 2013-10-06 VITALS — BP 153/72 | HR 64 | Temp 97.7°F | Ht 61.0 in | Wt 168.0 lb

## 2013-10-06 DIAGNOSIS — M19011 Primary osteoarthritis, right shoulder: Secondary | ICD-10-CM

## 2013-10-06 DIAGNOSIS — M25511 Pain in right shoulder: Secondary | ICD-10-CM

## 2013-10-06 DIAGNOSIS — M25519 Pain in unspecified shoulder: Secondary | ICD-10-CM

## 2013-10-06 DIAGNOSIS — Z23 Encounter for immunization: Secondary | ICD-10-CM

## 2013-10-06 DIAGNOSIS — M19019 Primary osteoarthritis, unspecified shoulder: Secondary | ICD-10-CM

## 2013-10-06 IMAGING — CR DG SHOULDER 2+V*R*
4 series · 4 of 4 positions shown · non-contrast
Comparison: None.

CLINICAL DATA: Right shoulder pain.

EXAM:
RIGHT SHOULDER - 2+ VIEW

[view not recorded (1 of 4)]
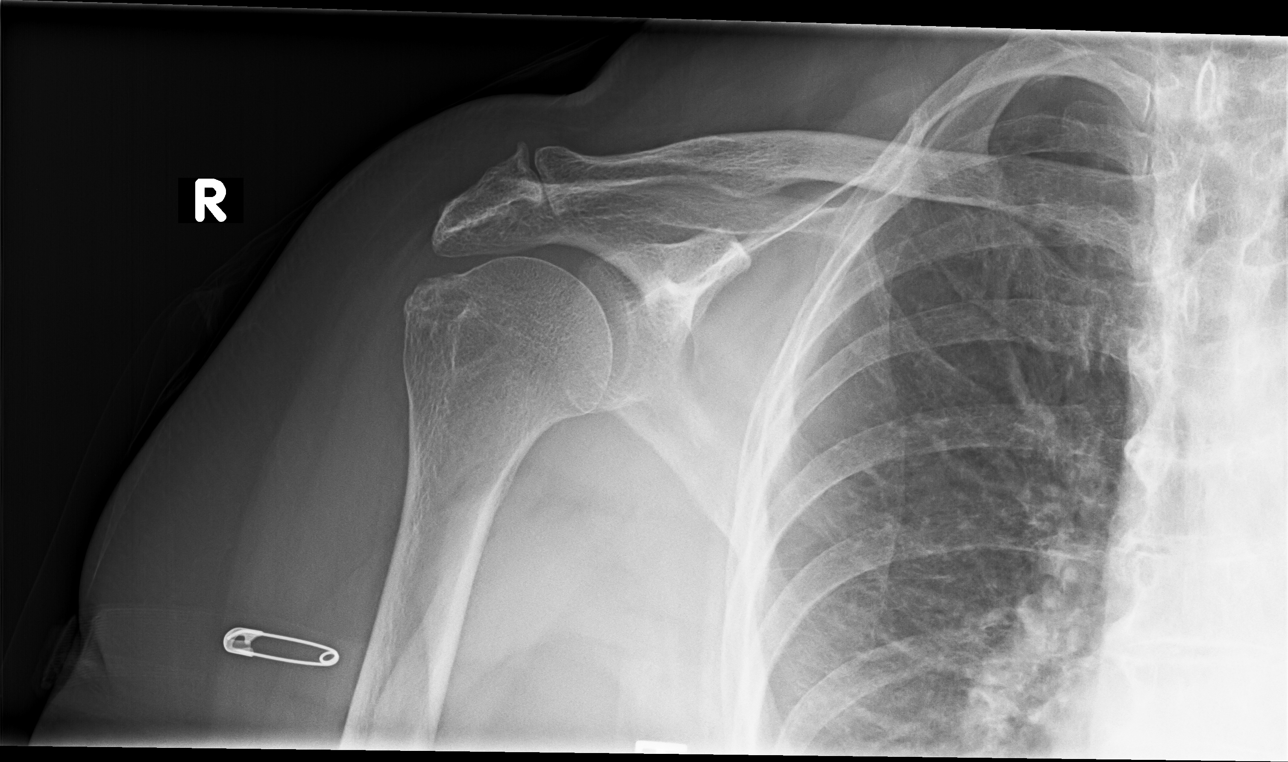

[view not recorded (2 of 4)]
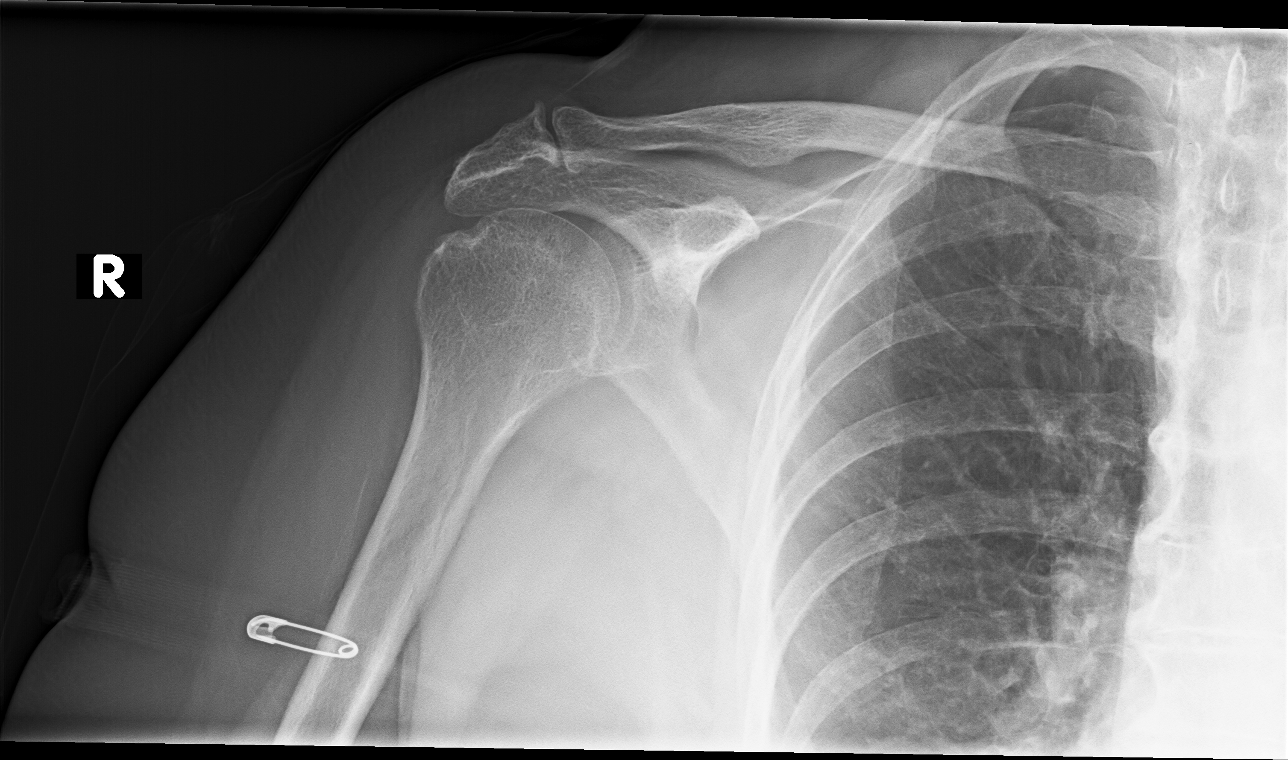

[view not recorded (3 of 4)]
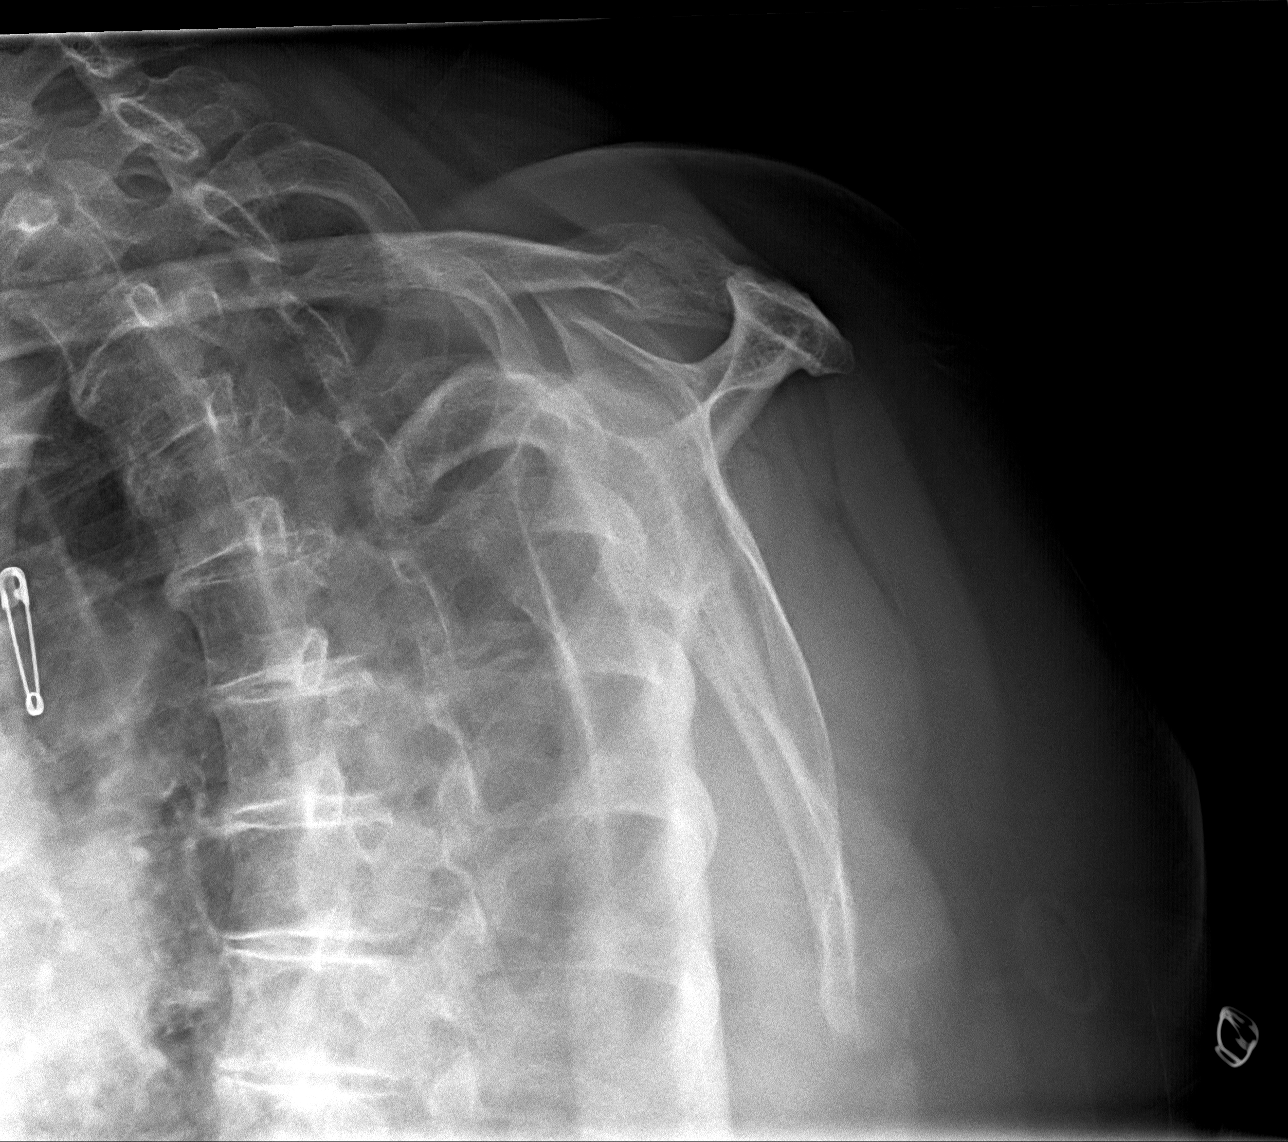

[view not recorded (4 of 4)]
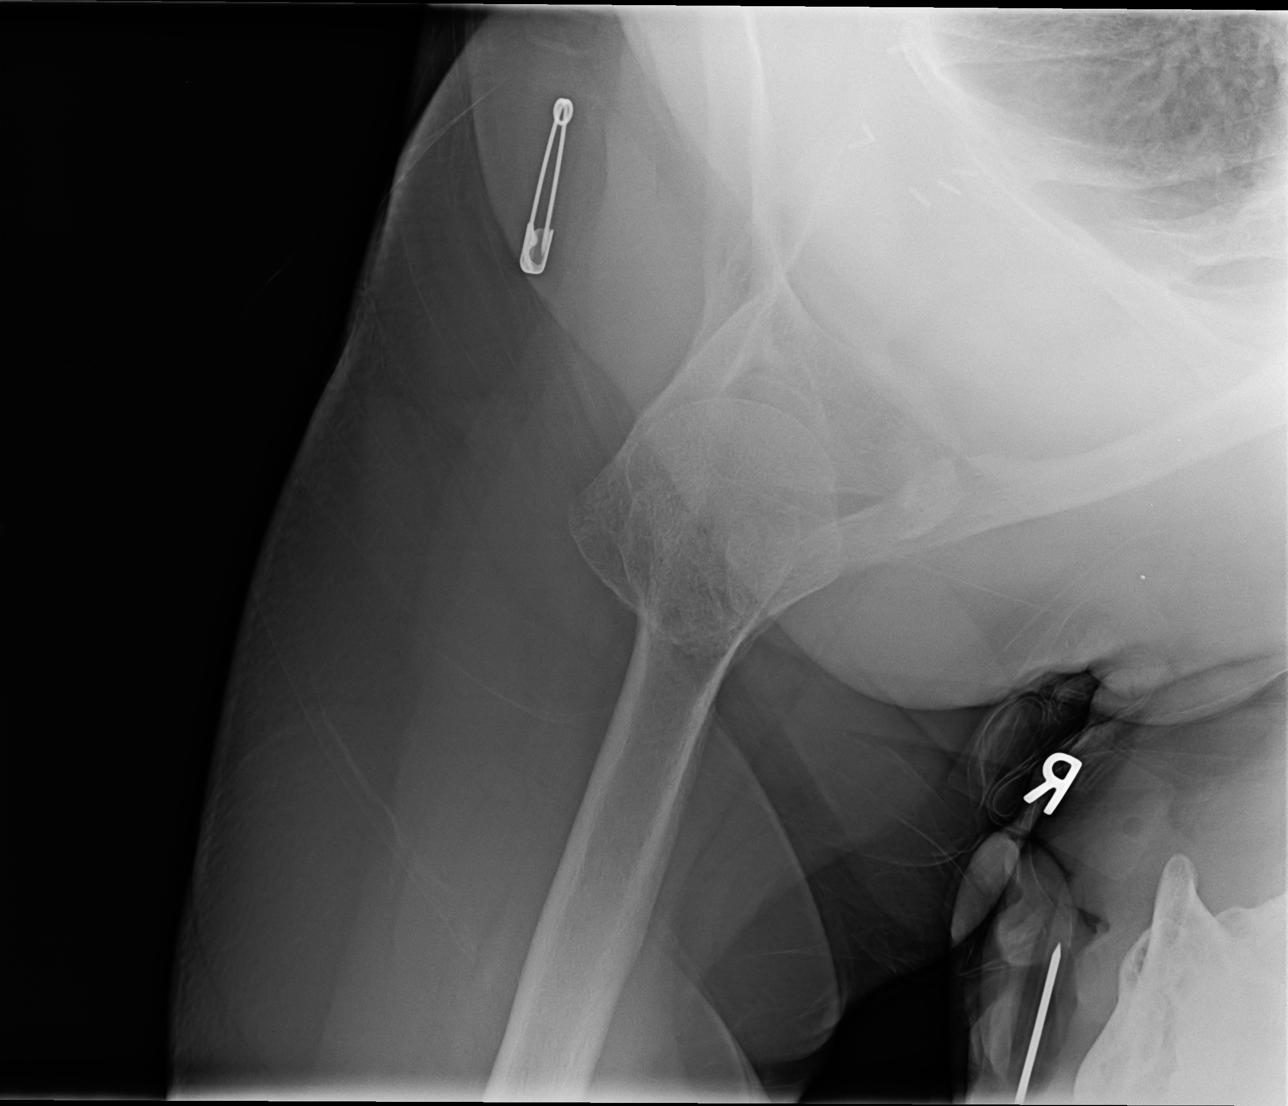

[4 of 4 positions shown; findings below may reference images not displayed]

FINDINGS: There is moderate degenerative change of the AC joint and minimal
degenerative change of the glenohumeral joint. There is no acute
fracture or dislocation.
IMPRESSION: No acute findings.

Mild degenerative change of the glenohumeral joint and moderate
degenerative change of the AC joint.

## 2013-10-06 NOTE — Patient Instructions (Addendum)
Osteoarthritis Osteoarthritis is the most common form of arthritis. It is redness, soreness, and swelling (inflammation) affecting the cartilage. Cartilage acts as a cushion, covering the ends of bones where they meet to form a joint. CAUSES  Over time, the cartilage begins to wear away. This causes bone to rub on bone. This produces pain and stiffness in the affected joints. Factors that contribute to this problem are:  Excessive body weight.  Age.  Overuse of joints. SYMPTOMS   People with osteoarthritis usually experience joint pain, swelling, or stiffness.  Over time, the joint may lose its normal shape.  Small deposits of bone (osteophytes) may grow on the edges of the joint.  Bits of bone or cartilage can break off and float inside the joint space. This may cause more pain and damage.  Osteoarthritis can lead to depression, anxiety, feelings of helplessness, and limitations on daily activities. The most commonly affected joints are in the:  Ends of the fingers.  Thumbs.  Neck.  Lower back.  Knees.  Hips. DIAGNOSIS  Diagnosis is mostly based on your symptoms and exam. Tests may be helpful, including:  X-rays of the affected joint.  A computerized magnetic scan (MRI).  Blood tests to rule out other types of arthritis.  Joint fluid tests. This involves using a needle to draw fluid from the joint and examining the fluid under a microscope. TREATMENT  Goals of treatment are to control pain, improve joint function, maintain a normal body weight, and maintain a healthy lifestyle. Treatment approaches may include:  A prescribed exercise program with rest and joint relief.  Weight control with nutritional education.  Pain relief techniques such as:  Properly applied heat and cold.  Electric pulses delivered to nerve endings under the skin (transcutaneous electrical nerve stimulation, TENS).  Massage.  Certain supplements. Ask your caregiver before using any  supplements, especially in combination with prescribed drugs.  Medicines to control pain, such as:  Acetaminophen.  Nonsteroidal anti-inflammatory drugs (NSAIDs), such as naproxen.  Narcotic or central-acting agents, such as tramadol. This drug carries a risk of addiction and is generally prescribed for short-term use.  Corticosteroids. These can be given orally or as injection. This is a short-term treatment, not recommended for routine use.  Surgery to reposition the bones and relieve pain (osteotomy) or to remove loose pieces of bone and cartilage. Joint replacement may be needed in advanced states of osteoarthritis. HOME CARE INSTRUCTIONS  Your caregiver can recommend specific types of exercise. These may include:  Strengthening exercises. These are done to strengthen the muscles that support joints affected by arthritis. They can be performed with weights or with exercise bands to add resistance.  Aerobic activities. These are exercises, such as brisk walking or low-impact aerobics, that get your heart pumping. They can help keep your lungs and circulatory system in shape.  Range-of-motion activities. These keep your joints limber.  Balance and agility exercises. These help you maintain daily living skills. Learning about your condition and being actively involved in your care will help improve the course of your osteoarthritis. SEEK MEDICAL CARE IF:   You feel hot or your skin turns red.  You develop a rash in addition to your joint pain.  You have an oral temperature above 102 F (38.9 C). FOR MORE INFORMATION  National Institute of Arthritis and Musculoskeletal and Skin Diseases: www.niams.nih.gov National Institute on Aging: www.nia.nih.gov American College of Rheumatology: www.rheumatology.org Document Released: 09/25/2005 Document Revised: 12/18/2011 Document Reviewed: 01/06/2010 [employer] Patient Information 2014 [employer].  

## 2013-10-06 NOTE — Progress Notes (Signed)
   Subjective:    Patient ID: Bonnie Tanner, female    DOB: [birth year], 72 y.o.   MRN: [id number]  Shoulder Pain  The pain is present in the right shoulder. This is a new problem. The current episode started 1 to 4 weeks ago (3 weeks ago was onset). There has been no history of extremity trauma. The quality of the pain is described as aching. Pertinent negatives include no fever, numbness or tingling. She has tried NSAIDS for the symptoms. The treatment provided mild relief.      Review of Systems  Constitutional: Negative for fever and chills.  Respiratory: Negative for chest tightness and shortness of breath.   Cardiovascular: Negative for chest pain and palpitations.  Musculoskeletal:       Right shoulder pain  Neurological: Negative for tingling and numbness.  All other systems reviewed and are negative.       Objective:   Physical Exam  Constitutional: She is oriented to person, place, and time. She appears well-developed and well-nourished.  Cardiovascular: Normal rate, regular rhythm and normal heart sounds.   Pulmonary/Chest: Effort normal and breath sounds normal. No respiratory distress. She exhibits no tenderness.  Musculoskeletal: She exhibits tenderness.  Right shoulder tenderness, pain noted with abduction of right arm  Neurological: She is alert and oriented to person, place, and time.  Skin: Skin is warm and dry.  Psychiatric: She has a normal mood and affect.    [facility] reading (PRIMARY) by Coralie Keens, FNP-C,  Degenerative joint disease.                                   Assessment & Plan:  1. Right shoulder pain  - DG Shoulder Right; Future  2. Osteoarthritis of right shoulder region -patient reports she will continue tylenol OTC -Will refer to ortho if symptoms worsen or no improvement -Patient verbalized understanding Coralie Keens, FNP-C

## 2013-10-29 ENCOUNTER — Telehealth: Payer: Self-pay | Admitting: Nurse Practitioner

## 2013-10-29 DIAGNOSIS — I10 Essential (primary) hypertension: Secondary | ICD-10-CM

## 2013-10-31 MED ORDER — VERAPAMIL HCL 120 MG PO TABS: 120.0000 mg | ORAL_TABLET | Freq: Every day | ORAL | Status: DC

## 2013-10-31 NOTE — Telephone Encounter (Signed)
rx sent to pharmacy

## 2014-01-28 ENCOUNTER — Other Ambulatory Visit: Payer: Self-pay | Admitting: *Deleted

## 2014-01-28 DIAGNOSIS — K219 Gastro-esophageal reflux disease without esophagitis: Secondary | ICD-10-CM

## 2014-01-28 MED ORDER — OMEPRAZOLE 40 MG PO CPDR: 40.0000 mg | DELAYED_RELEASE_CAPSULE | Freq: Every day | ORAL | Status: DC

## 2014-03-04 ENCOUNTER — Other Ambulatory Visit: Payer: Self-pay | Admitting: *Deleted

## 2014-03-05 NOTE — Telephone Encounter (Signed)
Patient NTBS for follow up and lab work No plipitor refill until seen for labs

## 2014-03-09 ENCOUNTER — Telehealth: Payer: Self-pay | Admitting: Nurse Practitioner

## 2014-03-09 NOTE — Telephone Encounter (Signed)
Patient has appt on June 29 can we refill atorvastatin until then

## 2014-03-10 ENCOUNTER — Other Ambulatory Visit: Payer: Self-pay | Admitting: *Deleted

## 2014-03-10 MED ORDER — ATORVASTATIN CALCIUM 40 MG PO TABS: 40.0000 mg | ORAL_TABLET | Freq: Every day | ORAL | Status: DC

## 2014-03-10 NOTE — Telephone Encounter (Signed)
Discussed with patient

## 2014-03-10 NOTE — Telephone Encounter (Signed)
Needs to have liver checked prior to filling meds so will have to wait until appointment- strict low fat diet

## 2014-04-06 ENCOUNTER — Encounter: Payer: Self-pay | Admitting: Nurse Practitioner

## 2014-04-06 ENCOUNTER — Ambulatory Visit (INDEPENDENT_AMBULATORY_CARE_PROVIDER_SITE_OTHER): Payer: Medicare Other | Admitting: Nurse Practitioner

## 2014-04-06 VITALS — BP 130/76 | HR 58 | Temp 98.1°F | Ht 61.0 in | Wt 172.2 lb

## 2014-04-06 DIAGNOSIS — E079 Disorder of thyroid, unspecified: Secondary | ICD-10-CM

## 2014-04-06 DIAGNOSIS — K219 Gastro-esophageal reflux disease without esophagitis: Secondary | ICD-10-CM

## 2014-04-06 DIAGNOSIS — Z713 Dietary counseling and surveillance: Secondary | ICD-10-CM

## 2014-04-06 DIAGNOSIS — Z6832 Body mass index (BMI) 32.0-32.9, adult: Secondary | ICD-10-CM

## 2014-04-06 DIAGNOSIS — I10 Essential (primary) hypertension: Secondary | ICD-10-CM

## 2014-04-06 DIAGNOSIS — E785 Hyperlipidemia, unspecified: Secondary | ICD-10-CM

## 2014-04-06 MED ORDER — VERAPAMIL HCL 120 MG PO TABS: 120.0000 mg | ORAL_TABLET | Freq: Every day | ORAL | Status: DC

## 2014-04-06 MED ORDER — ATORVASTATIN CALCIUM 40 MG PO TABS: 40.0000 mg | ORAL_TABLET | Freq: Every day | ORAL | Status: DC

## 2014-04-06 MED ORDER — LEVOTHYROXINE SODIUM 75 MCG PO TABS: 75.0000 ug | ORAL_TABLET | Freq: Every day | ORAL | Status: DC

## 2014-04-06 MED ORDER — OMEPRAZOLE 40 MG PO CPDR: 40.0000 mg | DELAYED_RELEASE_CAPSULE | Freq: Every day | ORAL | Status: DC

## 2014-04-06 MED ORDER — LISINOPRIL 40 MG PO TABS: 40.0000 mg | ORAL_TABLET | Freq: Every day | ORAL | Status: DC

## 2014-04-06 NOTE — Progress Notes (Addendum)
Subjective:    Tanner ID: Bonnie Tanner, female    DOB: 01-08-1941, 73 y.o.   MRN: [id number]  Tanner here today for follow up of chronic medical problems. No complaints today.   Hypertension This is a chronic problem. The current episode started more than 1 year ago. The problem is unchanged. The problem is controlled. Pertinent negatives include no blurred vision, chest pain, headaches, neck pain, orthopnea, palpitations, peripheral edema or shortness of breath. There are no associated agents to hypertension. Risk factors for coronary artery disease include dyslipidemia, family history, obesity and post-menopausal state. Past treatments include ACE inhibitors and beta blockers. The current treatment provides moderate improvement. Compliance problems include exercise and diet.  Hypertensive end-organ damage includes a thyroid problem.  Hyperlipidemia This is a chronic problem. The current episode started more than 1 year ago. The problem is uncontrolled. Recent lipid tests were reviewed and are high. Exacerbating diseases include obesity. She has no history of diabetes or hypothyroidism. There are no known factors aggravating her hyperlipidemia. Pertinent negatives include no chest pain, leg pain, myalgias or shortness of breath. Treatments tried: was on crestor and caused leg pain. The current treatment provides mild improvement of lipids. Compliance problems include adherence to diet and adherence to exercise.  Risk factors for coronary artery disease include family history, hypertension, obesity and post-menopausal.  Thyroid Problem Presents for follow-up (hypothyroidim) visit. Tanner reports no cold intolerance, depressed mood, diaphoresis, diarrhea, dry skin, heat intolerance, hoarse voice, palpitations, visual change, weight gain or weight loss. The symptoms have been stable. Her past medical history is significant for hyperlipidemia. There is no history of diabetes.  gerd Omeprazole works  well- keeps symptoms under control   Review of Systems  Constitutional: Negative for weight loss, weight gain and diaphoresis.  HENT: Negative for hoarse voice.   Eyes: Negative for blurred vision.  Respiratory: Negative for shortness of breath.   Cardiovascular: Negative for chest pain, palpitations and orthopnea.  Gastrointestinal: Negative for diarrhea.  Endocrine: Negative for cold intolerance and heat intolerance.  Musculoskeletal: Negative for myalgias and neck pain.  Neurological: Negative for headaches.  All other systems reviewed and are negative.      Objective:   Physical Exam  Constitutional: She is oriented to person, place, and time. She appears well-developed and well-nourished.  HENT:  Nose: Nose normal.  Mouth/Throat: Oropharynx is clear and moist.  Eyes: EOM are normal.  Neck: Trachea normal, normal range of motion and full passive range of motion without pain. Neck supple. No JVD present. Carotid bruit is not present. No thyromegaly present.  Cardiovascular: Normal rate, regular rhythm, normal heart sounds and intact distal pulses.  Exam reveals no gallop and no friction rub.   No murmur heard. Pulmonary/Chest: Effort normal and breath sounds normal.  Abdominal: Soft. Bowel sounds are normal. She exhibits no distension and no mass. There is no tenderness.  Musculoskeletal: Normal range of motion.  Lymphadenopathy:    She has no cervical adenopathy.  Neurological: She is alert and oriented to person, place, and time. She has normal reflexes.  Skin: Skin is warm and dry.  Psychiatric: She has a normal mood and affect. Her behavior is normal. Judgment and thought content normal.   BP 130/76  Pulse 58  Temp(Src) 98.1 F (36.7 C) (Oral)  Ht '5\' 1"'  (1.549 m)  Wt 172 lb 3.2 oz (78.109 kg)  BMI 32.55 kg/m2        Assessment & Plan:   1. THYROID DISORDER   2.  Essential hypertension, benign   3. Hyperlipidemia LDL goal <100   4. Gastroesophageal reflux  disease without esophagitis   5. BMI 32.0-32.9,adult   6. Weight loss counseling, encounter for     Orders Placed This Encounter  Procedures  . CMP14+EGFR  . NMR, lipoprofile  . Thyroid Panel With TSH   Meds ordered this encounter  Medications  . DISCONTD: lisinopril (PRINIVIL,ZESTRIL) 40 MG tablet    Sig: Take 40 mg by mouth daily.  . verapamil (CALAN) 120 MG tablet    Sig: Take 1 tablet (120 mg total) by mouth daily.    Dispense:  90 tablet    Refill:  1    Order Specific Question:  Supervising Provider    Answer:  Chipper Herb [[id number]]  . omeprazole (PRILOSEC) 40 MG capsule    Sig: Take 1 capsule (40 mg total) by mouth daily.    Dispense:  90 capsule    Refill:  1    Must be seen before next refill    Order Specific Question:  Supervising Provider    Answer:  Chipper Herb [[id number]]  . levothyroxine (SYNTHROID, LEVOTHROID) 75 MCG tablet    Sig: Take 1 tablet (75 mcg total) by mouth daily before breakfast.    Dispense:  90 tablet    Refill:  1    Order Specific Question:  Supervising Provider    Answer:  Chipper Herb [[id number]]  . atorvastatin (LIPITOR) 40 MG tablet    Sig: Take 1 tablet (40 mg total) by mouth daily.    Dispense:  90 tablet    Refill:  1    Order Specific Question:  Supervising Provider    Answer:  Chipper Herb [[id number]]  . lisinopril (PRINIVIL,ZESTRIL) 40 MG tablet    Sig: Take 1 tablet (40 mg total) by mouth daily.    Dispense:  90 tablet    Refill:  1    Order Specific Question:  Supervising Provider    Answer:  Chipper Herb [[id number]]  Tanner to schedule mammogram on  Mobile truck hemoccult cards given to Tanner with directions Labs pending Health maintenance reviewed Diet and exercise encouraged Continue all meds Follow up  In 3 months  [place], FNP

## 2014-04-07 LAB — CMP14+EGFR
A/G RATIO: 1.6 (ref 1.1–2.5)
ALK PHOS: 159 IU/L — AB (ref 39–117)
ALT: 29 IU/L (ref 0–32)
AST: 23 IU/L (ref 0–40)
Albumin: 4.3 g/dL (ref 3.5–4.8)
BILIRUBIN TOTAL: 0.4 mg/dL (ref 0.0–1.2)
BUN/Creatinine Ratio: 14 (ref 11–26)
BUN: 12 mg/dL (ref 8–27)
CHLORIDE: 101 mmol/L (ref 97–108)
CO2: 26 mmol/L (ref 18–29)
Calcium: 10.4 mg/dL — ABNORMAL HIGH (ref 8.7–10.3)
Creatinine, Ser: 0.85 mg/dL (ref 0.57–1.00)
GFR calc non Af Amer: 70 mL/min/{1.73_m2} (ref >59)
GFR, EST AFRICAN AMERICAN: 80 mL/min/{1.73_m2} (ref >59)
Globulin, Total: 2.7 g/dL (ref 1.5–4.5)
Glucose: 91 mg/dL (ref 65–99)
POTASSIUM: 4.5 mmol/L (ref 3.5–5.2)
SODIUM: 142 mmol/L (ref 134–144)
Total Protein: 7 g/dL (ref 6.0–8.5)

## 2014-04-07 LAB — NMR, LIPOPROFILE
Cholesterol: 158 mg/dL (ref 100–199)
HDL Cholesterol by NMR: 59 mg/dL (ref >39)
HDL Particle Number: 39.2 umol/L (ref >=30.5)
LDL Particle Number: 993 nmol/L (ref <1000)
LDL Size: 20.4 nm (ref >20.5)
LDLC SERPL CALC-MCNC: 76 mg/dL (ref 0–99)
LP-IR SCORE: 43 (ref <=45)
SMALL LDL PARTICLE NUMBER: 501 nmol/L (ref <=527)
Triglycerides by NMR: 115 mg/dL (ref 0–149)

## 2014-04-07 LAB — THYROID PANEL WITH TSH
Free Thyroxine Index: 2.4 (ref 1.2–4.9)
T3 Uptake Ratio: 25 % (ref 24–39)
T4 TOTAL: 9.4 ug/dL (ref 4.5–12.0)
TSH: 4.65 u[IU]/mL — AB (ref 0.450–4.500)

## 2014-04-08 ENCOUNTER — Telehealth: Payer: Self-pay | Admitting: Family Medicine

## 2014-04-08 NOTE — Telephone Encounter (Signed)
Message copied by Waverly Ferrari on Wed Apr 08, 2014 10:37 AM ------      Message from: Chevis Pretty      Created: Tue Apr 07, 2014  8:42 AM       Kidney and liver function stable      Cholesterol  Looks great      Continue current meds- low fat diet and exercise and recheck in 3 months       ------

## 2014-04-28 ENCOUNTER — Telehealth: Payer: Self-pay | Admitting: Nurse Practitioner

## 2014-04-28 DIAGNOSIS — R928 Other abnormal and inconclusive findings on diagnostic imaging of breast: Secondary | ICD-10-CM

## 2014-04-28 NOTE — Telephone Encounter (Signed)
Referral made 

## 2014-07-13 ENCOUNTER — Encounter: Payer: Self-pay | Admitting: Nurse Practitioner

## 2014-07-13 ENCOUNTER — Ambulatory Visit (INDEPENDENT_AMBULATORY_CARE_PROVIDER_SITE_OTHER): Payer: Medicare Other | Admitting: Nurse Practitioner

## 2014-07-13 VITALS — BP 152/73 | HR 72 | Temp 98.5°F | Ht 61.0 in | Wt 172.0 lb

## 2014-07-13 DIAGNOSIS — Z6832 Body mass index (BMI) 32.0-32.9, adult: Secondary | ICD-10-CM

## 2014-07-13 DIAGNOSIS — Z13820 Encounter for screening for osteoporosis: Secondary | ICD-10-CM

## 2014-07-13 DIAGNOSIS — E079 Disorder of thyroid, unspecified: Secondary | ICD-10-CM

## 2014-07-13 DIAGNOSIS — E785 Hyperlipidemia, unspecified: Secondary | ICD-10-CM

## 2014-07-13 DIAGNOSIS — Z713 Dietary counseling and surveillance: Secondary | ICD-10-CM

## 2014-07-13 DIAGNOSIS — I10 Essential (primary) hypertension: Secondary | ICD-10-CM

## 2014-07-13 DIAGNOSIS — K219 Gastro-esophageal reflux disease without esophagitis: Secondary | ICD-10-CM

## 2014-07-13 MED ORDER — OMEPRAZOLE 40 MG PO CPDR: 40.0000 mg | DELAYED_RELEASE_CAPSULE | Freq: Every day | ORAL | Status: DC

## 2014-07-13 NOTE — Patient Instructions (Signed)

## 2014-07-13 NOTE — Progress Notes (Signed)
Subjective:    Patient ID: Bonnie Tanner, female    DOB: 16-Mar-1941, 73 y.o.   MRN: [id number]  Patient here today for follow up of chronic medical problems. No complaints today.   Sinusitis Pertinent negatives include no diaphoresis, headaches, hoarse voice, neck pain or shortness of breath.  Hypertension This is a chronic problem. The current episode started more than 1 year ago. The problem is unchanged. The problem is controlled. Pertinent negatives include no blurred vision, chest pain, headaches, neck pain, orthopnea, palpitations, peripheral edema or shortness of breath. There are no associated agents to hypertension. Risk factors for coronary artery disease include dyslipidemia, family history, obesity and post-menopausal state. Past treatments include ACE inhibitors and beta blockers. The current treatment provides moderate improvement. Compliance problems include exercise and diet.  Hypertensive end-organ damage includes a thyroid problem.  Hyperlipidemia This is a chronic problem. The current episode started more than 1 year ago. The problem is uncontrolled. Recent lipid tests were reviewed and are high. Exacerbating diseases include obesity. She has no history of diabetes or hypothyroidism. There are no known factors aggravating her hyperlipidemia. Pertinent negatives include no chest pain, leg pain, myalgias or shortness of breath. Treatments tried: was on crestor and caused leg pain. The current treatment provides mild improvement of lipids. Compliance problems include adherence to diet and adherence to exercise.  Risk factors for coronary artery disease include family history, hypertension, obesity and post-menopausal.  Thyroid Problem Presents for follow-up (hypothyroidim) visit. Patient reports no cold intolerance, depressed mood, diaphoresis, diarrhea, dry skin, heat intolerance, hoarse voice, palpitations, visual change, weight gain or weight loss. The symptoms have been stable. Her  past medical history is significant for hyperlipidemia. There is no history of diabetes.  gerd Omeprazole works well- keeps symptoms under control   Review of Systems  Constitutional: Negative for weight loss, weight gain and diaphoresis.  HENT: Negative for hoarse voice.   Eyes: Negative for blurred vision.  Respiratory: Negative for shortness of breath.   Cardiovascular: Negative for chest pain, palpitations and orthopnea.  Gastrointestinal: Negative for diarrhea.  Endocrine: Negative for cold intolerance and heat intolerance.  Musculoskeletal: Negative for myalgias and neck pain.  Neurological: Negative for headaches.  All other systems reviewed and are negative.      Objective:   Physical Exam  Constitutional: She is oriented to person, place, and time. She appears well-developed and well-nourished.  HENT:  Nose: Nose normal.  Mouth/Throat: Oropharynx is clear and moist.  Eyes: EOM are normal.  Neck: Trachea normal, normal range of motion and full passive range of motion without pain. Neck supple. No JVD present. Carotid bruit is not present. No thyromegaly present.  Cardiovascular: Normal rate, regular rhythm, normal heart sounds and intact distal pulses.  Exam reveals no gallop and no friction rub.   No murmur heard. Pulmonary/Chest: Effort normal and breath sounds normal.  Abdominal: Soft. Bowel sounds are normal. She exhibits no distension and no mass. There is no tenderness.  Musculoskeletal: Normal range of motion.  Lymphadenopathy:    She has no cervical adenopathy.  Neurological: She is alert and oriented to person, place, and time. She has normal reflexes.  Skin: Skin is warm and dry.  Psychiatric: She has a normal mood and affect. Her behavior is normal. Judgment and thought content normal.   BP 152/73  Pulse 72  Temp(Src) 98.5 F (36.9 C) (Oral)  Ht _0  (1.549 m)  Wt 172 lb (78.019 kg)  BMI 32.52 kg/m2  Assessment & Plan:   1.  Gastroesophageal reflux disease without esophagitis Avoid spicy and fatty food - omeprazole (PRILOSEC) 40 MG capsule; Take 1 capsule (40 mg total) by mouth daily.  Dispense: 90 capsule; Refill: 1  2. Disorder of thyroid  3. Essential hypertension, benign Avoid salt in diet - CMP14+EGFR  4. Hyperlipidemia LDL goal <100 - NMR, lipoprofile  5. Screening for osteoporosis Weight bearing exercises - DG Bone Density; Future  6. BMI 32.0-32.9,adult Discussed diet and exercise for person with BMI >25 Will recheck weight in 3-6 months  7. Weight loss counseling, encounter for     Labs pending Health maintenance reviewed Diet and exercise encouraged Continue all meds Follow up  In 3 months   [place], FNP

## 2014-07-14 ENCOUNTER — Telehealth: Payer: Self-pay | Admitting: Family Medicine

## 2014-07-14 LAB — CMP14+EGFR
ALT: 25 IU/L (ref 0–32)
AST: 23 IU/L (ref 0–40)
Albumin/Globulin Ratio: 1.8 (ref 1.1–2.5)
Albumin: 4.6 g/dL (ref 3.5–4.8)
Alkaline Phosphatase: 174 IU/L — ABNORMAL HIGH (ref 39–117)
BUN/Creatinine Ratio: 16 (ref 11–26)
BUN: 15 mg/dL (ref 8–27)
CALCIUM: 10.8 mg/dL — AB (ref 8.7–10.3)
CHLORIDE: 101 mmol/L (ref 97–108)
CO2: 24 mmol/L (ref 18–29)
Creatinine, Ser: 0.93 mg/dL (ref 0.57–1.00)
GFR calc Af Amer: 72 mL/min/{1.73_m2} (ref >59)
GFR calc non Af Amer: 62 mL/min/{1.73_m2} (ref >59)
Globulin, Total: 2.6 g/dL (ref 1.5–4.5)
Glucose: 93 mg/dL (ref 65–99)
Potassium: 4.6 mmol/L (ref 3.5–5.2)
SODIUM: 144 mmol/L (ref 134–144)
Total Bilirubin: 0.5 mg/dL (ref 0.0–1.2)
Total Protein: 7.2 g/dL (ref 6.0–8.5)

## 2014-07-14 LAB — NMR, LIPOPROFILE
Cholesterol: 162 mg/dL (ref 100–199)
HDL CHOLESTEROL BY NMR: 60 mg/dL (ref >39)
HDL Particle Number: 43 umol/L (ref >=30.5)
LDL Particle Number: 1048 nmol/L — ABNORMAL HIGH (ref <1000)
LDL Size: 20.6 nm (ref >20.5)
LDLC SERPL CALC-MCNC: 82 mg/dL (ref 0–99)
LP-IR SCORE: 42 (ref <=45)
SMALL LDL PARTICLE NUMBER: 618 nmol/L — AB (ref <=527)
Triglycerides by NMR: 98 mg/dL (ref 0–149)

## 2014-07-14 NOTE — Telephone Encounter (Signed)
Message copied by Waverly Ferrari on Tue Jul 14, 2014 10:44 AM ------      Message from: Chevis Pretty      Created: Tue Jul 14, 2014  9:01 AM       Kidney and liver function stable      Cholesterol looks great      Continue current meds- low fat diet and exercise and recheck in 3 months       ------

## 2014-07-14 NOTE — Telephone Encounter (Signed)
Pt aware of lab results.  She will call for next appt.  rs

## 2014-07-24 ENCOUNTER — Encounter: Payer: Self-pay | Admitting: Nurse Practitioner

## 2014-08-25 ENCOUNTER — Ambulatory Visit: Payer: Medicare Other

## 2014-08-29 ENCOUNTER — Ambulatory Visit: Payer: Medicare Other

## 2014-08-29 ENCOUNTER — Ambulatory Visit (INDEPENDENT_AMBULATORY_CARE_PROVIDER_SITE_OTHER): Payer: Medicare Other | Admitting: *Deleted

## 2014-08-29 DIAGNOSIS — Z23 Encounter for immunization: Secondary | ICD-10-CM

## 2014-10-14 ENCOUNTER — Other Ambulatory Visit: Payer: Self-pay

## 2014-10-14 ENCOUNTER — Ambulatory Visit: Payer: Medicare Other

## 2014-10-19 DIAGNOSIS — H524 Presbyopia: Secondary | ICD-10-CM | POA: Diagnosis not present

## 2014-10-19 DIAGNOSIS — Z961 Presence of intraocular lens: Secondary | ICD-10-CM | POA: Diagnosis not present

## 2014-10-19 DIAGNOSIS — H5203 Hypermetropia, bilateral: Secondary | ICD-10-CM | POA: Diagnosis not present

## 2014-10-19 DIAGNOSIS — H52221 Regular astigmatism, right eye: Secondary | ICD-10-CM | POA: Diagnosis not present

## 2014-12-05 ENCOUNTER — Other Ambulatory Visit: Payer: Self-pay | Admitting: Nurse Practitioner

## 2014-12-07 NOTE — Telephone Encounter (Signed)
Refill 66mos to make appt in March

## 2015-01-02 ENCOUNTER — Other Ambulatory Visit: Payer: Self-pay | Admitting: Nurse Practitioner

## 2015-01-23 ENCOUNTER — Other Ambulatory Visit: Payer: Self-pay | Admitting: Nurse Practitioner

## 2015-02-02 ENCOUNTER — Ambulatory Visit (INDEPENDENT_AMBULATORY_CARE_PROVIDER_SITE_OTHER): Payer: Medicare Other | Admitting: *Deleted

## 2015-02-02 ENCOUNTER — Encounter: Payer: Self-pay | Admitting: *Deleted

## 2015-02-02 ENCOUNTER — Ambulatory Visit (INDEPENDENT_AMBULATORY_CARE_PROVIDER_SITE_OTHER): Payer: Medicare Other

## 2015-02-02 VITALS — BP 132/68 | HR 63 | Resp 20 | Ht 60.0 in | Wt 169.2 lb

## 2015-02-02 DIAGNOSIS — Z780 Asymptomatic menopausal state: Secondary | ICD-10-CM

## 2015-02-02 DIAGNOSIS — Z0000 Encounter for general adult medical examination without abnormal findings: Secondary | ICD-10-CM

## 2015-02-02 DIAGNOSIS — Z23 Encounter for immunization: Secondary | ICD-10-CM

## 2015-02-02 DIAGNOSIS — Z13820 Encounter for screening for osteoporosis: Secondary | ICD-10-CM

## 2015-02-02 NOTE — Patient Instructions (Signed)
DASH Eating Plan DASH stands for "Dietary Approaches to Stop Hypertension." The DASH eating plan is a healthy eating plan that has been shown to reduce high blood pressure (hypertension). Additional health benefits may include reducing the risk of type 2 diabetes mellitus, heart disease, and stroke. The DASH eating plan may also help with weight loss. WHAT DO I NEED TO KNOW ABOUT THE DASH EATING PLAN? For the DASH eating plan, you will follow these general guidelines:  Choose foods with a percent daily value for sodium of less than 5% (as listed on the food label).  Use salt-free seasonings or herbs instead of table salt or sea salt.  Check with your health care provider or pharmacist before using salt substitutes.  Eat lower-sodium products, often labeled as "lower sodium" or "no salt added."  Eat fresh foods.  Eat more vegetables, fruits, and low-fat dairy products.  Choose whole grains. Look for the word "whole" as the first word in the ingredient list.  Choose fish and skinless chicken or turkey more often than red meat. Limit fish, poultry, and meat to 6 oz (170 g) each day.  Limit sweets, desserts, sugars, and sugary drinks.  Choose heart-healthy fats.  Limit cheese to 1 oz (28 g) per day.  Eat more home-cooked food and less restaurant, buffet, and fast food.  Limit fried foods.  Cook foods using methods other than frying.  Limit canned vegetables. If you do use them, rinse them well to decrease the sodium.  When eating at a restaurant, ask that your food be prepared with less salt, or no salt if possible. WHAT FOODS CAN I EAT? Seek help from a dietitian for individual calorie needs. Grains Whole grain or whole wheat bread. Brown rice. Whole grain or whole wheat pasta. Quinoa, bulgur, and whole grain cereals. Low-sodium cereals. Corn or whole wheat flour tortillas. Whole grain cornbread. Whole grain crackers. Low-sodium crackers. Vegetables Fresh or frozen vegetables  (raw, steamed, roasted, or grilled). Low-sodium or reduced-sodium tomato and vegetable juices. Low-sodium or reduced-sodium tomato sauce and paste. Low-sodium or reduced-sodium canned vegetables.  Fruits All fresh, canned (in natural juice), or frozen fruits. Meat and Other Protein Products Ground beef (85% or leaner), grass-fed beef, or beef trimmed of fat. Skinless chicken or turkey. Ground chicken or turkey. Pork trimmed of fat. All fish and seafood. Eggs. Dried beans, peas, or lentils. Unsalted nuts and seeds. Unsalted canned beans. Dairy Low-fat dairy products, such as skim or 1% milk, 2% or reduced-fat cheeses, low-fat ricotta or cottage cheese, or plain low-fat yogurt. Low-sodium or reduced-sodium cheeses. Fats and Oils Tub margarines without trans fats. Light or reduced-fat mayonnaise and salad dressings (reduced sodium). Avocado. Safflower, olive, or canola oils. Natural peanut or almond butter. Other Unsalted popcorn and pretzels. The items listed above may not be a complete list of recommended foods or beverages. Contact your dietitian for more options. WHAT FOODS ARE NOT RECOMMENDED? Grains White bread. White pasta. White rice. Refined cornbread. Bagels and croissants. Crackers that contain trans fat. Vegetables Creamed or fried vegetables. Vegetables in a cheese sauce. Regular canned vegetables. Regular canned tomato sauce and paste. Regular tomato and vegetable juices. Fruits Dried fruits. Canned fruit in light or heavy syrup. Fruit juice. Meat and Other Protein Products Fatty cuts of meat. Ribs, chicken wings, bacon, sausage, bologna, salami, chitterlings, fatback, hot dogs, bratwurst, and packaged luncheon meats. Salted nuts and seeds. Canned beans with salt. Dairy Whole or 2% milk, cream, half-and-half, and cream cheese. Whole-fat or sweetened yogurt. Full-fat   cheeses or blue cheese. Nondairy creamers and whipped toppings. Processed cheese, cheese spreads, or cheese  curds. Condiments Onion and garlic salt, seasoned salt, table salt, and sea salt. Canned and packaged gravies. Worcestershire sauce. Tartar sauce. Barbecue sauce. Teriyaki sauce. Soy sauce, including reduced sodium. Steak sauce. Fish sauce. Oyster sauce. Cocktail sauce. Horseradish. Ketchup and mustard. Meat flavorings and tenderizers. Bouillon cubes. Hot sauce. Tabasco sauce. Marinades. Taco seasonings. Relishes. Fats and Oils Butter, stick margarine, lard, shortening, ghee, and bacon fat. Coconut, palm kernel, or palm oils. Regular salad dressings. Other Pickles and olives. Salted popcorn and pretzels. The items listed above may not be a complete list of foods and beverages to avoid. Contact your dietitian for more information. WHERE CAN I FIND MORE INFORMATION? National Heart, Lung, and Blood Institute: www.nhlbi.nih.gov/health/health-topics/topics/dash/ Document Released: 09/14/2011 Document Revised: 02/09/2014 Document Reviewed: 07/30/2013 ExitCare Patient Information 2015 [employer]. This information is not intended to replace advice given to you by your health care provider. Make sure you discuss any questions you have with your health care provider. Bone Densitometry Bone densitometry is a special X-ray that measures your bone density and can be used to help predict your risk of bone fractures. This test is used to determine bone mineral content and density to diagnose osteoporosis. Osteoporosis is the loss of bone that may cause the bone to become weak. Osteoporosis commonly occurs in women entering menopause. However, it may be found in men and in people with other diseases. PREPARATION FOR TEST No preparation necessary. WHO SHOULD BE TESTED?  All women older than 65.  Postmenopausal women (50 to 65) with risk factors for osteoporosis.  People with a previous fracture caused by normal activities.  People with a small body frame (less than 127 poundsor a body mass index [BMI]  of less than 21).  People who have a parent with a hip fracture or history of osteoporosis.  People who smoke.  People who have rheumatoid arthritis.  Anyone who engages in excessive alcohol use (more than 3 drinks most days).  Women who experience early menopause. WHEN SHOULD YOU BE RETESTED? Current guidelines suggest that you should wait at least 2 years before doing a bone density test again if your first test was normal.Recent studies indicated that women with normal bone density may be able to wait a few years before needing to repeat a bone density test. You should discuss this with your caregiver.  NORMAL FINDINGS   Normal: less than standard deviation below normal (greater than -1).  Osteopenia: 1 to 2.5 standard deviations below normal (-1 to -2.5).  Osteoporosis: greater than 2.5 standard deviations below normal (less than -2.5). Test results are reported as a "T score" and a "Z score."The T score is a number that compares your bone density with the bone density of healthy, young women.The Z score is a number that compares your bone density with the scores of women who are the same age, gender, and race.  Ranges for normal findings may vary among different laboratories and hospitals. You should always check with your doctor after having lab work or other tests done to discuss the meaning of your test results and whether your values are considered within normal limits. MEANING OF TEST  Your caregiver will go over the test results with you and discuss the importance and meaning of your results, as well as treatment options and the need for additional tests if necessary. OBTAINING THE TEST RESULTS It is your responsibility to obtain your test results.   Ask the lab or department performing the test when and how you will get your results. Document Released: 10/17/2004 Document Revised: 12/18/2011 Document Reviewed: 11/09/2010 [facility] Patient Information 2015 [place], [place]. This  information is not intended to replace advice given to you by your health care provider. Make sure you discuss any questions you have with your health care provider. Pneumococcal Conjugate Vaccine: What You Need to Know Your doctor recommends that you, or your child, get a dose of PCV13 today. 1. Why get vaccinated? Pneumococcal conjugate vaccine (called PCV13 or Prevnar 13) is recommended to protect infants and toddlers, and some older children and adults with certain health conditions, from pneumococcal disease. Pneumococcal disease is caused by infection with Streptococcus pneumoniae bacteria. These bacteria can spread from person to person through close contact. Pneumococcal disease can lead to severe health problems, including pneumonia, blood infections, and meningitis. Meningitis is an infection of the covering of the brain. Pneumococcal meningitis is fairly rare (less than 1 case per 100,000 people each year), but it leads to other health problems, including deafness and brain damage. In children, it is fatal in about 1 case out of 10. Children younger than two are at higher risk for serious disease than older children. People with certain medical conditions, people over age 29, and cigarette smokers are also at higher risk. Before vaccine, pneumococcal infections caused many problems each year in the [place] in children younger than 5, including:  more than 700 cases of meningitis,  13,000 blood infections,  about 5 million ear infections, and  about 200 deaths. About 4,000 adults still die each year because of pneumococcal infections. Pneumococcal infections can be hard to treat because some strains are resistant to antibiotics. This makes prevention through vaccination even more important. 2. PCV13 vaccine There are more than 90 types of pneumococcal bacteria. PCV13 protects against 13 of them. These 13 strains cause most severe infections in children and about half of  infections in adults.  PCV13 is routinely given to children at 2, 4, 6, and 49-25 months of age. Children in this age range are at greatest risk for serious diseases caused by pneumococcal infection. PCV13 vaccine may also be recommended for some older children or adults. Your doctor can give you details. A second type of pneumococcal vaccine, called PPSV23, may also be given to some children and adults, including anyone over age 51. There is a separate Vaccine Information Statement for this vaccine. 3. Precautions  Anyone who has ever had a life-threatening allergic reaction to a dose of this vaccine, to an earlier pneumococcal vaccine called PCV7 (or Prevnar), or to any vaccine containing diphtheria toxoid (for example, DTaP), should not get PCV13. Anyone with a severe allergy to any component of PCV13 should not get the vaccine. Tell your doctor if the person being vaccinated has any severe allergies. If the person scheduled for vaccination is sick, your doctor might decide to reschedule the shot on another day. Your doctor can give you more information about any of these precautions. 4. What are the risks of PCV13 vaccine?  With any medicine, including vaccines, there is a chance of side effects. These are usually mild and go away on their own, but serious reactions are also possible. Reported problems associated with PCV13 vary by dose and age, but generally:  About half of children became drowsy after the shot, had a temporary loss of appetite, or had redness or tenderness where the shot was given.  About 1 out of 3  had swelling where the shot was given.  About 1 out of 3 had a mild fever, and about 1 in 20 had a higher fever (over 102.81F).  Up to about 8 out of 10 became fussy or irritable. Adults receiving the vaccine have reported redness, pain, and swelling where the shot was given. Mild fever, fatigue, headache, chills, or muscle pain have also been reported. Life-threatening  allergic reactions from any vaccine are very rare. 5. What if there is a serious reaction? What should I look for?  Look for anything that concerns you, such as signs of a severe allergic reaction, very high fever, or behavior changes. Signs of a severe allergic reaction can include hives, swelling of the face and throat, difficulty breathing, a fast heartbeat, dizziness, and weakness. These would start a few minutes to a few hours after the vaccination. What should I do?  If you think it is a severe allergic reaction or other emergency that can't wait, call 9-1-1 or get the person to the nearest hospital. Otherwise, call your doctor.  Afterward, the reaction should be reported to the Vaccine Adverse Event Reporting System (VAERS). Your doctor might file this report, or you can do it yourself through the VAERS web site at www.vaers.SamedayNews.es, or by calling (631)416-7917. VAERS is only for reporting reactions. They do not give medical advice. 6. The National Vaccine Injury Compensation Program The [employer] Vaccine Injury Compensation Program (VICP) is a federal program that was created to compensate people who may have been injured by certain vaccines. Persons who believe they may have been injured by a vaccine can learn about the program and about filing a claim by calling 206-716-8124 or visiting the [employer] website at GoldCloset.com.ee. 7. How can I learn more?  Ask your doctor.  Call your local or state health department.  Contact the Centers for Disease Control and Prevention (CDC):  Call 701-518-2730 (1-800-CDC-INFO) or  Visit CDC's website at http://hunter.com/ CDC PCV13 Vaccine VIS (Interim) (12/06/11) Document Released: 07/23/2006 Document Revised: 02/09/2014 Document Reviewed: 11/14/2013 [facility] Patient Information 2015 [place], [place]. This information is not intended to replace advice given to you by your health care provider. Make sure you discuss any  questions you have with your health care provider.

## 2015-02-02 NOTE — Progress Notes (Signed)
Subjective:   Bonnie Tanner is a 74 y.o. female who presents for an Initial Medicare Annual Wellness Visit. [name] is married and lives with her husband of 57 years. 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 She works 4 half days a week. She enjoys cooking for her family and helping with her grandson.  Review of Systems     Cardiac Risk Factors include: advanced age (>53men, >76 women);dyslipidemia;hypertension;obesity (BMI >30kg/m2)     Objective:    Today's Vitals   02/02/15 1414  BP: 132/68  Pulse: 63  Resp: 20  Height: 5' (1.524 m)  Weight: 169 lb 3.2 oz (76.749 kg)    Current Medications (verified) Outpatient Encounter Prescriptions as of 02/02/2015  Medication Sig  . aspirin 325 MG tablet Take 325 mg by mouth daily.    Marland Kitchen atorvastatin (LIPITOR) 40 MG tablet Take 1 tablet (40 mg total) by mouth daily.  . cholecalciferol (VITAMIN D) 1000 UNITS tablet Take 1,000 Units by mouth daily.    . famotidine-calcium carbonate-magnesium hydroxide (TUMS DUAL ACTION) 10-800-165 MG CHEW Chew 1 tablet by mouth 3 (three) times daily.  Marland Kitchen levothyroxine (SYNTHROID, LEVOTHROID) 75 MCG tablet TAKE ONE TABLET BY MOUTH ONCE DAILY BEFORE BREAKFAST  . lisinopril (PRINIVIL,ZESTRIL) 40 MG tablet TAKE ONE TABLET BY MOUTH ONCE DAILY NEEDS  OFFICE  VISIT  FOR  ADDITIONAL  REFILLS  . multivitamin-iron-minerals-folic acid (CENTRUM) chewable tablet Chew 1 tablet by mouth daily.    Marland Kitchen omeprazole (PRILOSEC) 40 MG capsule TAKE ONE CAPSULE BY MOUTH ONCE DAILY (MUST BE SEEN BEFORE NEXT REFILL)  . verapamil (CALAN) 120 MG tablet Take 1 tablet (120 mg total) by mouth daily.    Allergies (verified) Latex; Penicillins; and Sulfonamide derivatives   History: Past Medical History  Diagnosis Date  . Hypothyroidism   . CAD (coronary artery disease)     Non obstructive  . Hyperlipidemia   . Hypertension   . Arthritis   . GERD (gastroesophageal reflux  disease)   . Heart murmur   . Cancer 2011    breast/radiation Tx/Lumpectomy   Past Surgical History  Procedure Laterality Date  . Cardiac catheterization  09/2009  . Cataract extraction  2006    bilateral  . Breast lumpectomy  04/19/2010    radiation therapy right breast  . Eye surgery  2000    made her ducts   Family History  Problem Relation Age of Onset  . Heart disease Mother 59  . Emphysema Father   . Heart disease Father   . Throat cancer Brother   . Prostate cancer Paternal Uncle   . Stomach cancer Paternal Uncle    Social History   Occupational History  . Beautician    Social History Main Topics  . Smoking status: Never Smoker   . Smokeless tobacco: Never Used  . Alcohol Use: No  . Drug Use: No  . Sexual Activity: Yes    Tobacco Counseling Never a smoker  Activities of Daily Living In your present state of health, do you have any difficulty performing the following activities: 02/02/2015  Hearing? Y  Vision? N  Difficulty concentrating or making decisions? N  Walking or climbing stairs? N  Dressing or bathing? N  Doing errands, shopping? N  Preparing Food and eating ? N  Using the Toilet? N  In the past six months, have you accidently leaked urine? N  Do you have problems with loss  of bowel control? N  Managing your Medications? N  Managing your Finances? N  Housekeeping or managing your Housekeeping? N    Immunizations and Health Maintenance Immunization History  Administered Date(s) Administered  . Influenza Whole 07/09/2010  . Influenza,inj,Quad PF,36+ Mos 10/06/2013, 08/29/2014  . Pneumococcal Polysaccharide-23 10/21/2012  . Td 07/09/2009   Health Maintenance Due  Topic Date Due  . ZOSTAVAX  08/07/2003  . PNA vac Low Risk Adult (2 of 2 - PCV13) 10/21/2013  . COLON CANCER SCREENING ANNUAL FOBT  11/25/2013    Patient Care Team: Chevis Pretty, FNP as PCP - General (Nurse Practitioner)     Assessment:   This is a routine  wellness examination for Bonnie Tanner.   Hearing/Vision screen No hearing deficits noted. Patient wears glasses and goes to [facility] in [place]. They always call her when it is time for her appointment.  Dietary issues and exercise activities discussed: Current Exercise Habits:: Home exercise routine, Type of exercise: walking, Time (Minutes): 30, Frequency (Times/Week): > 6, Weekly Exercise (Minutes/Week): 0, Intensity: Mild  Goals    None     Depression Screen PHQ 2/9 Scores 02/02/2015 07/13/2014 04/06/2014  PHQ - 2 Score 0 0 0    Fall Risk Fall Risk  02/02/2015 07/13/2014 04/06/2014  Falls in the past year? No No No    Cognitive Function: MMSE - Mini Mental State Exam 02/02/2015  Not completed: Refused  Orientation to time 5  Orientation to Place 5  Registration 3  Attention/ Calculation 5  Recall 3  Language- name 2 objects 2  Language- repeat 1  Language- follow 3 step command 3  Language- read & follow direction 1  Write a sentence 1  Copy design 1  Total score 30    Screening Tests Health Maintenance  Topic Date Due  . ZOSTAVAX  08/07/2003  . PNA vac Low Risk Adult (2 of 2 - PCV13) 10/21/2013  . COLON CANCER SCREENING ANNUAL FOBT  11/25/2013  . INFLUENZA VACCINE  05/10/2015  . MAMMOGRAM  05/04/2016  . COLONOSCOPY  11/25/2017  . TETANUS/TDAP  07/10/2019  . DEXA SCAN  Completed      Plan:   Continue therapeutic lifestyle changes which include good diet and exercise. Continue current medication as previously ordered. Fall prevention discussed with patient. Pt encouraged to increase physical activity to 3-4 times a week for 20-30 minutes at a time.  During the course of the visit, Brina was educated and counseled about the following appropriate screening and preventive services:   Vaccines to include Pneumoccal, Influenza, Tdap , Zostavax. Prevnar 13 given today. Patient refuses shingles vaccine.  Electrocardiogram last completed 11/2009  Cardiovascular disease  screening lipids completed 07/13/2014   Colorectal cancer screening last colonoscopy 11/25/2012 per Dr. Raquel James pt reports she has to have them every 5 years due to polyps  Bone density screening completed today   Diabetes screening normal glucose 07/13/14   Glaucoma screening patient gets early eye exams at [facility] in [place] and will call for her appointment later this year when time.  Mammography last mammogram 05/04/2014 patient gets early mammograms due to history of Breast Cancer 04/2010 lumpectomy with radiation.  Nutrition counseling DASH diet reccommended   Smoking cessation counseling NA   Patient Instructions (the written plan) were given to the patient.    Joneen Boers, RN   02/02/2015      I have reviewed and agree with the above AWV documentation.  Claretta Fraise, M.D.

## 2015-02-20 ENCOUNTER — Other Ambulatory Visit: Payer: Self-pay | Admitting: Nurse Practitioner

## 2015-02-22 ENCOUNTER — Encounter: Payer: Self-pay | Admitting: Nurse Practitioner

## 2015-02-22 ENCOUNTER — Ambulatory Visit (INDEPENDENT_AMBULATORY_CARE_PROVIDER_SITE_OTHER): Payer: Medicare Other | Admitting: Nurse Practitioner

## 2015-02-22 VITALS — BP 138/77 | HR 70 | Temp 97.0°F | Ht 60.0 in | Wt 170.0 lb

## 2015-02-22 DIAGNOSIS — E785 Hyperlipidemia, unspecified: Secondary | ICD-10-CM | POA: Diagnosis not present

## 2015-02-22 DIAGNOSIS — I10 Essential (primary) hypertension: Secondary | ICD-10-CM | POA: Diagnosis not present

## 2015-02-22 DIAGNOSIS — E079 Disorder of thyroid, unspecified: Secondary | ICD-10-CM

## 2015-02-22 DIAGNOSIS — K219 Gastro-esophageal reflux disease without esophagitis: Secondary | ICD-10-CM

## 2015-02-22 DIAGNOSIS — Z1212 Encounter for screening for malignant neoplasm of rectum: Secondary | ICD-10-CM | POA: Diagnosis not present

## 2015-02-22 MED ORDER — VERAPAMIL HCL 120 MG PO TABS: 120.0000 mg | ORAL_TABLET | Freq: Every day | ORAL | Status: DC

## 2015-02-22 MED ORDER — OMEPRAZOLE 40 MG PO CPDR: DELAYED_RELEASE_CAPSULE | ORAL | Status: DC

## 2015-02-22 MED ORDER — ATORVASTATIN CALCIUM 40 MG PO TABS: 40.0000 mg | ORAL_TABLET | Freq: Every day | ORAL | Status: DC

## 2015-02-22 MED ORDER — LEVOTHYROXINE SODIUM 75 MCG PO TABS: ORAL_TABLET | ORAL | Status: DC

## 2015-02-22 MED ORDER — LISINOPRIL 40 MG PO TABS: ORAL_TABLET | ORAL | Status: DC

## 2015-02-22 NOTE — Patient Instructions (Signed)
Fat and Cholesterol Control Diet Fat and cholesterol levels in your blood and organs are influenced by your diet. High levels of fat and cholesterol may lead to diseases of the heart, small and large blood vessels, gallbladder, liver, and pancreas. CONTROLLING FAT AND CHOLESTEROL WITH DIET Although exercise and lifestyle factors are important, your diet is key. That is because certain foods are known to raise cholesterol and others to lower it. The goal is to balance foods for their effect on cholesterol and more importantly, to replace saturated and trans fat with other types of fat, such as monounsaturated fat, polyunsaturated fat, and omega-3 fatty acids. On average, a person should consume no more than 15 to 17 g of saturated fat daily. Saturated and trans fats are considered "bad" fats, and they will raise LDL cholesterol. Saturated fats are primarily found in animal products such as meats, butter, and cream. However, that does not mean you need to give up all your favorite foods. Today, there are good tasting, low-fat, low-cholesterol substitutes for most of the things you like to eat. Choose low-fat or nonfat alternatives. Choose round or loin cuts of red meat. These types of cuts are lowest in fat and cholesterol. Chicken (without the skin), fish, veal, and ground turkey breast are great choices. Eliminate fatty meats, such as hot dogs and salami. Even shellfish have little or no saturated fat. Have a 3 oz (85 g) portion when you eat lean meat, poultry, or fish. Trans fats are also called "partially hydrogenated oils." They are oils that have been scientifically manipulated so that they are solid at room temperature resulting in a longer shelf life and improved taste and texture of foods in which they are added. Trans fats are found in stick margarine, some tub margarines, cookies, crackers, and baked goods.  When baking and cooking, oils are a great substitute for butter. The monounsaturated oils are  especially beneficial since it is believed they lower LDL and raise HDL. The oils you should avoid entirely are saturated tropical oils, such as coconut and palm.  Remember to eat a lot from food groups that are naturally free of saturated and trans fat, including fish, fruit, vegetables, beans, grains (barley, rice, couscous, bulgur wheat), and pasta (without cream sauces).  IDENTIFYING FOODS THAT LOWER FAT AND CHOLESTEROL  Soluble fiber may lower your cholesterol. This type of fiber is found in fruits such as apples, vegetables such as broccoli, potatoes, and carrots, legumes such as beans, peas, and lentils, and grains such as barley. Foods fortified with plant sterols (phytosterol) may also lower cholesterol. You should eat at least 2 g per day of these foods for a cholesterol lowering effect.  Read package labels to identify low-saturated fats, trans fat free, and low-fat foods at the supermarket. Select cheeses that have only 2 to 3 g saturated fat per ounce. Use a heart-healthy tub margarine that is free of trans fats or partially hydrogenated oil. When buying baked goods (cookies, crackers), avoid partially hydrogenated oils. Breads and muffins should be made from whole grains (whole-wheat or whole oat flour, instead of "flour" or "enriched flour"). Buy non-creamy canned soups with reduced salt and no added fats.  FOOD PREPARATION TECHNIQUES  Never deep-fry. If you must fry, either stir-fry, which uses very little fat, or use non-stick cooking sprays. When possible, broil, bake, or roast meats, and steam vegetables. Instead of putting butter or margarine on vegetables, use lemon and herbs, applesauce, and cinnamon (for squash and sweet potatoes). Use nonfat   yogurt, salsa, and low-fat dressings for salads.  LOW-SATURATED FAT / LOW-FAT FOOD SUBSTITUTES Meats / Saturated Fat (g)  Avoid: Steak, marbled (3 oz/85 g) / 11 g  Choose: Steak, lean (3 oz/85 g) / 4 g  Avoid: Hamburger (3 oz/85 g) / 7  g  Choose: Hamburger, lean (3 oz/85 g) / 5 g  Avoid: Ham (3 oz/85 g) / 6 g  Choose: Ham, lean cut (3 oz/85 g) / 2.4 g  Avoid: Chicken, with skin, dark meat (3 oz/85 g) / 4 g  Choose: Chicken, skin removed, dark meat (3 oz/85 g) / 2 g  Avoid: Chicken, with skin, light meat (3 oz/85 g) / 2.5 g  Choose: Chicken, skin removed, light meat (3 oz/85 g) / 1 g Dairy / Saturated Fat (g)  Avoid: Whole milk (1 cup) / 5 g  Choose: Low-fat milk, 2% (1 cup) / 3 g  Choose: Low-fat milk, 1% (1 cup) / 1.5 g  Choose: Skim milk (1 cup) / 0.3 g  Avoid: Hard cheese (1 oz/28 g) / 6 g  Choose: Skim milk cheese (1 oz/28 g) / 2 to 3 g  Avoid: Cottage cheese, 4% fat (1 cup) / 6.5 g  Choose: Low-fat cottage cheese, 1% fat (1 cup) / 1.5 g  Avoid: Ice cream (1 cup) / 9 g  Choose: Sherbet (1 cup) / 2.5 g  Choose: Nonfat frozen yogurt (1 cup) / 0.3 g  Choose: Frozen fruit bar / trace  Avoid: Whipped cream (1 tbs) / 3.5 g  Choose: Nondairy whipped topping (1 tbs) / 1 g Condiments / Saturated Fat (g)  Avoid: Mayonnaise (1 tbs) / 2 g  Choose: Low-fat mayonnaise (1 tbs) / 1 g  Avoid: Butter (1 tbs) / 7 g  Choose: Extra light margarine (1 tbs) / 1 g  Avoid: Coconut oil (1 tbs) / 11.8 g  Choose: Olive oil (1 tbs) / 1.8 g  Choose: Corn oil (1 tbs) / 1.7 g  Choose: Safflower oil (1 tbs) / 1.2 g  Choose: Sunflower oil (1 tbs) / 1.4 g  Choose: Soybean oil (1 tbs) / 2.4 g  Choose: Canola oil (1 tbs) / 1 g Document Released: 09/25/2005 Document Revised: 01/20/2013 Document Reviewed: 12/24/2013 [employer] Patient Information 2015 [employer]. This information is not intended to replace advice given to you by your health care provider. Make sure you discuss any questions you have with your health care provider.  

## 2015-02-22 NOTE — Progress Notes (Signed)
Subjective:    Patient ID: Bonnie Tanner, female    DOB: 30-Nov-1940, 74 y.o.   MRN: [id number]  Patient here today for follow up of chronic medical problems. No complaints today.   Hypertension This is a chronic problem. The current episode started more than 1 year ago. The problem is controlled. Pertinent negatives include no chest pain or palpitations. Risk factors for coronary artery disease include dyslipidemia, obesity, post-menopausal state and sedentary lifestyle. Past treatments include ACE inhibitors. The current treatment provides mild improvement. Compliance problems include diet and exercise.  Hypertensive end-organ damage includes a thyroid problem. There is no history of CAD/MI or CVA.  Hyperlipidemia This is a chronic problem. The current episode started more than 1 year ago. The problem is controlled. Recent lipid tests were reviewed and are variable. Pertinent negatives include no chest pain or myalgias. Current antihyperlipidemic treatment includes statins. The current treatment provides moderate improvement of lipids. Compliance problems include adherence to diet and adherence to exercise.  Risk factors for coronary artery disease include dyslipidemia, hypertension, obesity, post-menopausal and a sedentary lifestyle.  Thyroid Problem Presents for follow-up visit. Patient reports no cold intolerance, diarrhea, heat intolerance, palpitations or visual change. Her past medical history is significant for hyperlipidemia.  gerd Omeprazole works well- keeps symptoms under control   Review of Systems  Constitutional: Negative.   HENT: Negative.   Cardiovascular: Negative for chest pain and palpitations.  Gastrointestinal: Negative for diarrhea.  Endocrine: Negative for cold intolerance and heat intolerance.  Genitourinary: Negative.   Musculoskeletal: Negative for myalgias.  Neurological: Negative.   Psychiatric/Behavioral: Negative.   All other systems reviewed and are  negative.      Objective:   Physical Exam  Constitutional: She is oriented to person, place, and time. She appears well-developed and well-nourished.  HENT:  Nose: Nose normal.  Mouth/Throat: Oropharynx is clear and moist.  Eyes: EOM are normal.  Neck: Trachea normal, normal range of motion and full passive range of motion without pain. Neck supple. No JVD present. Carotid bruit is not present. No thyromegaly present.  Cardiovascular: Normal rate, regular rhythm, normal heart sounds and intact distal pulses.  Exam reveals no gallop and no friction rub.   No murmur heard. Pulmonary/Chest: Effort normal and breath sounds normal.  Abdominal: Soft. Bowel sounds are normal. She exhibits no distension and no mass. There is no tenderness.  Musculoskeletal: Normal range of motion.  Lymphadenopathy:    She has no cervical adenopathy.  Neurological: She is alert and oriented to person, place, and time. She has normal reflexes.  Skin: Skin is warm and dry.  Psychiatric: She has a normal mood and affect. Her behavior is normal. Judgment and thought content normal.   BP 138/77 mmHg  Pulse 70  Temp(Src) 97 F (36.1 C) (Oral)  Ht 5' (1.524 m)  Wt 170 lb (77.111 kg)  BMI 33.20 kg/m2        Assessment & Plan:   1. Essential hypertension, benign Do not add salt to diet - CMP14+EGFR - verapamil (CALAN) 120 MG tablet; Take 1 tablet (120 mg total) by mouth daily.  Dispense: 90 tablet; Refill: 1 - lisinopril (PRINIVIL,ZESTRIL) 40 MG tablet; TAKE ONE TABLET BY MOUTH ONCE DAILY NEEDS  OFFICE  VISIT  FOR  ADDITIONAL  REFILLS  Dispense: 90 tablet; Refill: 1  2. Gastroesophageal reflux disease without esophagitis Avoid spicy foods Do not eat 2 hours prior to bedtime  - omeprazole (PRILOSEC) 40 MG capsule; TAKE ONE CAPSULE BY MOUTH ONCE  DAILY (MUST BE SEEN BEFORE NEXT REFILL)  Dispense: 90 capsule; Refill: 1  3. Disorder of thyroid - levothyroxine (SYNTHROID, LEVOTHROID) 75 MCG tablet; TAKE ONE  TABLET BY MOUTH ONCE DAILY BEFORE BREAKFAST  Dispense: 90 tablet; Refill: 1  4. Hyperlipidemia LDL goal <100 Low fat diet - NMR, lipoprofile - atorvastatin (LIPITOR) 40 MG tablet; Take 1 tablet (40 mg total) by mouth daily.  Dispense: 90 tablet; Refill: 1  5. Screening for malignant neoplasm of the rectum hemoccult cards given to patient with directions - Fecal occult blood, imunochemical; Future    Labs pending Health maintenance reviewed Diet and exercise encouraged Continue all meds Follow up  In   [place], FNP

## 2015-02-23 LAB — CMP14+EGFR
ALBUMIN: 4.5 g/dL (ref 3.5–4.8)
ALT: 41 IU/L — ABNORMAL HIGH (ref 0–32)
AST: 31 IU/L (ref 0–40)
Albumin/Globulin Ratio: 2 (ref 1.1–2.5)
Alkaline Phosphatase: 149 IU/L — ABNORMAL HIGH (ref 39–117)
BUN / CREAT RATIO: 20 (ref 11–26)
BUN: 16 mg/dL (ref 8–27)
Bilirubin Total: 0.4 mg/dL (ref 0.0–1.2)
CALCIUM: 10.5 mg/dL — AB (ref 8.7–10.3)
CO2: 25 mmol/L (ref 18–29)
CREATININE: 0.81 mg/dL (ref 0.57–1.00)
Chloride: 99 mmol/L (ref 97–108)
GFR calc Af Amer: 85 mL/min/{1.73_m2} (ref >59)
GFR calc non Af Amer: 73 mL/min/{1.73_m2} (ref >59)
GLOBULIN, TOTAL: 2.3 g/dL (ref 1.5–4.5)
GLUCOSE: 90 mg/dL (ref 65–99)
Potassium: 4.5 mmol/L (ref 3.5–5.2)
Sodium: 141 mmol/L (ref 134–144)
Total Protein: 6.8 g/dL (ref 6.0–8.5)

## 2015-02-23 LAB — NMR, LIPOPROFILE
Cholesterol: 174 mg/dL (ref 100–199)
HDL CHOLESTEROL BY NMR: 70 mg/dL (ref >39)
HDL PARTICLE NUMBER: 40.7 umol/L (ref >=30.5)
LDL Particle Number: 1259 nmol/L — ABNORMAL HIGH (ref <1000)
LDL Size: 20.8 nm (ref >20.5)
LDL-C: 85 mg/dL (ref 0–99)
LP-IR SCORE: 32 (ref <=45)
Small LDL Particle Number: 509 nmol/L (ref <=527)
Triglycerides by NMR: 96 mg/dL (ref 0–149)

## 2015-05-11 DIAGNOSIS — Z853 Personal history of malignant neoplasm of breast: Secondary | ICD-10-CM | POA: Diagnosis not present

## 2015-05-11 DIAGNOSIS — Z1231 Encounter for screening mammogram for malignant neoplasm of breast: Secondary | ICD-10-CM | POA: Diagnosis not present

## 2015-05-11 DIAGNOSIS — C50911 Malignant neoplasm of unspecified site of right female breast: Secondary | ICD-10-CM | POA: Diagnosis not present

## 2015-05-11 DIAGNOSIS — Z9889 Other specified postprocedural states: Secondary | ICD-10-CM | POA: Diagnosis not present

## 2015-05-18 ENCOUNTER — Ambulatory Visit: Payer: Medicare Other | Admitting: Nurse Practitioner

## 2015-05-31 ENCOUNTER — Ambulatory Visit (INDEPENDENT_AMBULATORY_CARE_PROVIDER_SITE_OTHER): Payer: Medicare Other | Admitting: Nurse Practitioner

## 2015-05-31 ENCOUNTER — Encounter: Payer: Self-pay | Admitting: Nurse Practitioner

## 2015-05-31 VITALS — BP 126/68 | HR 58 | Temp 97.5°F | Ht 60.0 in | Wt 167.0 lb

## 2015-05-31 DIAGNOSIS — E785 Hyperlipidemia, unspecified: Secondary | ICD-10-CM

## 2015-05-31 DIAGNOSIS — I10 Essential (primary) hypertension: Secondary | ICD-10-CM | POA: Diagnosis not present

## 2015-05-31 DIAGNOSIS — Z8679 Personal history of other diseases of the circulatory system: Secondary | ICD-10-CM

## 2015-05-31 DIAGNOSIS — E079 Disorder of thyroid, unspecified: Secondary | ICD-10-CM | POA: Diagnosis not present

## 2015-05-31 DIAGNOSIS — K219 Gastro-esophageal reflux disease without esophagitis: Secondary | ICD-10-CM

## 2015-05-31 DIAGNOSIS — Z6832 Body mass index (BMI) 32.0-32.9, adult: Secondary | ICD-10-CM

## 2015-05-31 NOTE — Patient Instructions (Signed)

## 2015-05-31 NOTE — Progress Notes (Signed)
  Subjective:    Patient ID: Bonnie Tanner, female    DOB: 01/19/1941, 74 y.o.   MRN: [id number]  Patient here today for follow up of chronic medical problems. No complaints today.   Hypertension This is a chronic problem. The current episode started more than 1 year ago. The problem is controlled. Pertinent negatives include no chest pain or palpitations. Risk factors for coronary artery disease include dyslipidemia, obesity, post-menopausal state and sedentary lifestyle. Past treatments include ACE inhibitors. The current treatment provides mild improvement. Compliance problems include diet and exercise.  Hypertensive end-organ damage includes a thyroid problem. There is no history of CAD/MI or CVA.  Hyperlipidemia This is a chronic problem. The current episode started more than 1 year ago. The problem is controlled. Recent lipid tests were reviewed and are variable. Pertinent negatives include no chest pain or myalgias. Current antihyperlipidemic treatment includes statins. The current treatment provides moderate improvement of lipids. Compliance problems include adherence to diet and adherence to exercise.  Risk factors for coronary artery disease include dyslipidemia, hypertension, obesity, post-menopausal and a sedentary lifestyle.  Thyroid Problem Presents for follow-up visit. Patient reports no cold intolerance, diarrhea, heat intolerance, palpitations or visual change. Her past medical history is significant for hyperlipidemia.  gerd Omeprazole works well- keeps symptoms under control CAD Currently on verapamil daily. Has not seen cardiologist in a long time.    Review of Systems  Constitutional: Negative.   HENT: Negative.   Cardiovascular: Negative for chest pain and palpitations.  Gastrointestinal: Negative for diarrhea.  Endocrine: Negative for cold intolerance and heat intolerance.  Genitourinary: Negative.   Musculoskeletal: Negative for myalgias.  Neurological: Negative.    Psychiatric/Behavioral: Negative.   All other systems reviewed and are negative.      Objective:   Physical Exam  Constitutional: She is oriented to person, place, and time. She appears well-developed and well-nourished.  HENT:  Nose: Nose normal.  Mouth/Throat: Oropharynx is clear and moist.  Eyes: EOM are normal.  Neck: Trachea normal, normal range of motion and full passive range of motion without pain. Neck supple. No JVD present. Carotid bruit is not present. No thyromegaly present.  Cardiovascular: Normal rate, regular rhythm, normal heart sounds and intact distal pulses.  Exam reveals no gallop and no friction rub.   No murmur heard. Pulmonary/Chest: Effort normal and breath sounds normal.  Abdominal: Soft. Bowel sounds are normal. She exhibits no distension and no mass. There is no tenderness.  Musculoskeletal: Normal range of motion.  Lymphadenopathy:    She has no cervical adenopathy.  Neurological: She is alert and oriented to person, place, and time. She has normal reflexes.  Skin: Skin is warm and dry.  Psychiatric: She has a normal mood and affect. Her behavior is normal. Judgment and thought content normal.   BP 126/68 mmHg  Pulse 58  Temp(Src) 97.5 F (36.4 C) (Oral)  Ht 5' (1.524 m)  Wt 167 lb (75.751 kg)  BMI 32.62 kg/m2      Assessment & Plan:

## 2015-06-01 ENCOUNTER — Telehealth: Payer: Self-pay | Admitting: *Deleted

## 2015-06-01 LAB — LIPID PANEL
CHOL/HDL RATIO: 2.8 ratio (ref 0.0–4.4)
Cholesterol, Total: 159 mg/dL (ref 100–199)
HDL: 57 mg/dL (ref >39)
LDL CALC: 69 mg/dL (ref 0–99)
TRIGLYCERIDES: 164 mg/dL — AB (ref 0–149)
VLDL Cholesterol Cal: 33 mg/dL (ref 5–40)

## 2015-06-01 LAB — THYROID PANEL WITH TSH
Free Thyroxine Index: 2.5 (ref 1.2–4.9)
T3 UPTAKE RATIO: 26 % (ref 24–39)
T4, Total: 9.6 ug/dL (ref 4.5–12.0)
TSH: 4.26 u[IU]/mL (ref 0.450–4.500)

## 2015-06-01 LAB — CMP14+EGFR
A/G RATIO: 1.6 (ref 1.1–2.5)
ALT: 26 IU/L (ref 0–32)
AST: 24 IU/L (ref 0–40)
Albumin: 4.2 g/dL (ref 3.5–4.8)
Alkaline Phosphatase: 147 IU/L — ABNORMAL HIGH (ref 39–117)
BUN/Creatinine Ratio: 19 (ref 11–26)
BUN: 16 mg/dL (ref 8–27)
Bilirubin Total: 0.4 mg/dL (ref 0.0–1.2)
CALCIUM: 10.2 mg/dL (ref 8.7–10.3)
CO2: 25 mmol/L (ref 18–29)
CREATININE: 0.86 mg/dL (ref 0.57–1.00)
Chloride: 102 mmol/L (ref 97–108)
GFR calc Af Amer: 79 mL/min/{1.73_m2} (ref >59)
GFR, EST NON AFRICAN AMERICAN: 68 mL/min/{1.73_m2} (ref >59)
GLOBULIN, TOTAL: 2.6 g/dL (ref 1.5–4.5)
Glucose: 89 mg/dL (ref 65–99)
POTASSIUM: 4.3 mmol/L (ref 3.5–5.2)
SODIUM: 140 mmol/L (ref 134–144)
TOTAL PROTEIN: 6.8 g/dL (ref 6.0–8.5)

## 2015-06-01 NOTE — Telephone Encounter (Signed)
lmtcb

## 2015-06-01 NOTE — Telephone Encounter (Signed)
-----   Message from [facility], [place] sent at 06/01/2015  1:39 PM EDT ----- Kidney and liver function stable Cholesterol looks great Thyroid normal Continue current meds- low fat diet and exercise and recheck in 3 months

## 2015-06-11 ENCOUNTER — Other Ambulatory Visit: Payer: Self-pay | Admitting: Nurse Practitioner

## 2015-07-17 ENCOUNTER — Other Ambulatory Visit: Payer: Self-pay | Admitting: Nurse Practitioner

## 2015-08-23 ENCOUNTER — Ambulatory Visit (INDEPENDENT_AMBULATORY_CARE_PROVIDER_SITE_OTHER): Payer: Medicare Other

## 2015-08-23 DIAGNOSIS — Z23 Encounter for immunization: Secondary | ICD-10-CM | POA: Diagnosis not present

## 2015-10-15 ENCOUNTER — Other Ambulatory Visit: Payer: Self-pay | Admitting: Nurse Practitioner

## 2015-10-28 ENCOUNTER — Other Ambulatory Visit: Payer: Self-pay | Admitting: Nurse Practitioner

## 2015-11-11 ENCOUNTER — Ambulatory Visit (INDEPENDENT_AMBULATORY_CARE_PROVIDER_SITE_OTHER): Payer: Medicare Other | Admitting: Family Medicine

## 2015-11-11 ENCOUNTER — Encounter: Payer: Self-pay | Admitting: Family Medicine

## 2015-11-11 VITALS — BP 139/85 | HR 76 | Temp 99.1°F | Ht 60.0 in | Wt 173.0 lb

## 2015-11-11 DIAGNOSIS — H00013 Hordeolum externum right eye, unspecified eyelid: Secondary | ICD-10-CM

## 2015-11-11 MED ORDER — DOXYCYCLINE HYCLATE 100 MG PO TABS: 100.0000 mg | ORAL_TABLET | Freq: Two times a day (BID) | ORAL | Status: DC

## 2015-11-11 NOTE — Progress Notes (Signed)
BP 139/85 mmHg  Pulse 76  Temp(Src) 99.1 F (37.3 C) (Oral)  Ht 5' (1.524 m)  Wt 173 lb (78.472 kg)  BMI 33.79 kg/m2   Subjective:    Patient ID: Bonnie Tanner, female    DOB: Nov 23, 1940, 75 y.o.   MRN: [id number]  HPI: Bonnie Tanner is a 75 y.o. female presenting on 11/11/2015 for Stye underneath right eye   HPI Stye under eye Patient has a large stye that is turned into a small abscess under her right eye. She denies any fevers or chills but does have significant pain and pressure and swelling in that area. She denies any visual disturbances or pain with movement of the eye. There is a small amount of surrounding erythema in the area as well. She has had styes in this area before but not to this extent.  Relevant past medical, surgical, family and social history reviewed and updated as indicated. Interim medical history since our last visit reviewed. Allergies and medications reviewed and updated.  Review of Systems  Constitutional: Negative for fever and chills.  HENT: Negative for congestion, ear discharge and ear pain.   Eyes: Negative for redness and visual disturbance.  Respiratory: Negative for chest tightness and shortness of breath.   Cardiovascular: Negative for chest pain and leg swelling.  Genitourinary: Negative for dysuria and difficulty urinating.  Musculoskeletal: Negative for back pain and gait problem.  Skin: Positive for color change, rash and wound.  Neurological: Negative for dizziness, light-headedness and headaches.  Psychiatric/Behavioral: Negative for behavioral problems and agitation.  All other systems reviewed and are negative.   Per HPI unless specifically indicated above     Medication List       This list is accurate as of: 11/11/15  4:55 PM.  Always use your most recent med list.               aspirin 325 MG tablet  Take 325 mg by mouth daily.     atorvastatin 40 MG tablet  Commonly known as:  LIPITOR  Take 1 tablet by mouth   daily     cholecalciferol 1000 units tablet  Commonly known as:  VITAMIN D  Take 1,000 Units by mouth daily.     doxycycline 100 MG tablet  Commonly known as:  VIBRA-TABS  Take 1 tablet (100 mg total) by mouth 2 (two) times daily. 1 po bid     levothyroxine 75 MCG tablet  Commonly known as:  SYNTHROID, LEVOTHROID  TAKE ONE TABLET BY MOUTH  ONCE DAILY BEFORE BREAKFAST     lisinopril 40 MG tablet  Commonly known as:  PRINIVIL,ZESTRIL  Take 1 tablet by mouth once daily     multivitamin-iron-minerals-folic acid chewable tablet  Chew 1 tablet by mouth daily.     omeprazole 40 MG capsule  Commonly known as:  PRILOSEC  Take 1 capsule by mouth  once daily     verapamil 120 MG tablet  Commonly known as:  CALAN  Take 1 tablet by mouth  daily           Objective:    BP 139/85 mmHg  Pulse 76  Temp(Src) 99.1 F (37.3 C) (Oral)  Ht 5' (1.524 m)  Wt 173 lb (78.472 kg)  BMI 33.79 kg/m2  Wt Readings from Last 3 Encounters:  11/11/15 173 lb (78.472 kg)  05/31/15 167 lb (75.751 kg)  02/22/15 170 lb (77.111 kg)    Physical Exam  Constitutional: She is oriented to  person, place, and time. She appears well-developed and well-nourished. No distress.  Eyes: Conjunctivae and EOM are normal. Pupils are equal, round, and reactive to light.  Cardiovascular: Normal rate, regular rhythm, normal heart sounds and intact distal pulses.   No murmur heard. Pulmonary/Chest: Effort normal and breath sounds normal. No respiratory distress. She has no wheezes.  Musculoskeletal: Normal range of motion. She exhibits no edema or tenderness.  Neurological: She is alert and oriented to person, place, and time. Coordination normal.  Skin: Skin is warm and dry. Lesion (mild surrounding erythema and a 0.25 centimeter abscess under right eye on the lower eyelid) and rash noted. She is not diaphoretic.  Psychiatric: She has a normal mood and affect. Her behavior is normal.  Nursing note and vitals  reviewed.  I&D of abscess: Patient has a stye that has turned into 0.25 cm abscess on her lower eyelid. Used an 11 blade to open the abscess and relieve the pressure and sent for culture patient tolerated well with minimal bleeding.    Assessment & Plan:   Problem List Items Addressed This Visit    None    Visit Diagnoses    Stye external, right    -  Primary    With abscess, will give doxycycline, had to lance because of the size of the abscess    Relevant Medications    doxycycline (VIBRA-TABS) 100 MG tablet    Other Relevant Orders    Anaerobic and Aerobic Culture       Follow up plan: Return if symptoms worsen or fail to improve.  Counseling provided for all of the vaccine components Orders Placed This Encounter  Procedures  . Anaerobic and Aerobic Culture    Caryl Pina, MD [facility] 11/11/2015, 4:55 PM

## 2015-11-15 LAB — ANAEROBIC AND AEROBIC CULTURE

## 2015-11-24 ENCOUNTER — Other Ambulatory Visit: Payer: Self-pay | Admitting: Nurse Practitioner

## 2016-02-14 ENCOUNTER — Other Ambulatory Visit: Payer: Self-pay | Admitting: Nurse Practitioner

## 2016-04-03 ENCOUNTER — Other Ambulatory Visit: Payer: Self-pay | Admitting: Nurse Practitioner

## 2016-04-03 NOTE — Telephone Encounter (Signed)
Last seen 05/2015

## 2016-04-03 NOTE — Telephone Encounter (Signed)
Last refill without being seen 

## 2016-04-04 NOTE — Telephone Encounter (Signed)
Patient aware that she will need to be seen  

## 2016-04-17 ENCOUNTER — Ambulatory Visit: Payer: Medicare Other | Admitting: Family

## 2016-04-17 ENCOUNTER — Ambulatory Visit (INDEPENDENT_AMBULATORY_CARE_PROVIDER_SITE_OTHER): Payer: Medicare Other | Admitting: Family

## 2016-04-17 ENCOUNTER — Encounter: Payer: Self-pay | Admitting: Family

## 2016-04-17 VITALS — BP 113/59 | HR 68 | Temp 97.0°F | Ht 60.0 in | Wt 171.2 lb

## 2016-04-17 DIAGNOSIS — H01006 Unspecified blepharitis left eye, unspecified eyelid: Secondary | ICD-10-CM | POA: Diagnosis not present

## 2016-04-17 MED ORDER — DOXYCYCLINE HYCLATE 100 MG PO TABS: 100.0000 mg | ORAL_TABLET | Freq: Two times a day (BID) | ORAL | Status: DC

## 2016-04-17 MED ORDER — BACITRACIN 500 UNIT/GM OP OINT: 1.0000 "application " | TOPICAL_OINTMENT | Freq: Four times a day (QID) | OPHTHALMIC | Status: DC

## 2016-04-17 NOTE — Progress Notes (Signed)
   Subjective:    Patient ID: Bonnie Tanner, female    DOB: Jan 24, 1941, 75 y.o.   MRN: [id number]  HPI Pt presents to the office today with left eye lid swelling. Pt states is started 4 days ago and is becoming worse. Pt states it started draining this am, but is unsure the color of the fluid. Pt states she is having constant pain of 6 out 10. Pt states she has redness and itching present. PT has tried baby shampoo with no relief.    Review of Systems  Eyes: Positive for pain, discharge, redness and itching.  Cardiovascular: Negative.   Gastrointestinal: Negative.   Musculoskeletal: Negative.        Objective:   Physical Exam  Constitutional: She is oriented to person, place, and time. She appears well-developed and well-nourished. No distress.  HENT:  Head: Normocephalic and atraumatic.  Right Ear: External ear normal.  Eyes: Pupils are equal, round, and reactive to light. Left eye exhibits hordeolum.  Left upper eye lid erythemas and swelling  Neck: Normal range of motion. Neck supple. No thyromegaly present.  Cardiovascular: Normal rate, regular rhythm, normal heart sounds and intact distal pulses.   No murmur heard. Pulmonary/Chest: Effort normal and breath sounds normal. No respiratory distress. She has no wheezes.  Abdominal: Soft. Bowel sounds are normal. She exhibits no distension. There is no tenderness.  Musculoskeletal: Normal range of motion. She exhibits no edema or tenderness.  Neurological: She is alert and oriented to person, place, and time. She has normal reflexes. No cranial nerve deficit.  Skin: Skin is warm and dry.  Psychiatric: She has a normal mood and affect. Her behavior is normal. Judgment and thought content normal.  Vitals reviewed.   BP 113/59 mmHg  Pulse 68  Temp(Src) 97 F (36.1 C) (Oral)  Ht 5' (1.524 m)  Wt 171 lb 3.2 oz (77.656 kg)  BMI 33.44 kg/m2       Assessment & Plan:  1. Blepharitis, left -Avoid rubbing eyes -Warm  compresses -Good hand hygiene discussed -RTO prn - bacitracin ophthalmic ointment; Place 1 application into the left eye 4 (four) times daily. apply to eye  Dispense: 3.5 g; Refill: 2 - doxycycline (VIBRA-TABS) 100 MG tablet; Take 1 tablet (100 mg total) by mouth 2 (two) times daily.  Dispense: 28 tablet; Refill: 0  Evelina Dun, FNP

## 2016-04-17 NOTE — Patient Instructions (Addendum)
Blepharitis Blepharitis is inflammation of the eyelids. Blepharitis may happen with:  Reddish, scaly skin around the scalp and eyebrows.  Burning or itching of the eyelids.  Eye discharge at night that causes the eyelashes to stick together in the morning.  Eyelashes that fall out.  Sensitivity to light. HOME CARE INSTRUCTIONS Pay attention to any changes in how you look or feel. Follow these instructions to help with your condition: Keeping Clean  Wash your hands often.  Wash your eyelids with warm water or with warm water that is mixed with a small amount of baby shampoo. Do this two times per day or as often as needed.  Wash your face and eyebrows at least once a day.  Use a clean towel each time you dry your eyelids. Do not use this towel to clean or dry other areas of your body. Do not share your towel with anyone. General Instructions  Avoid wearing makeup until you get better. Do not share makeup with anyone.  Avoid rubbing your eyes.  Apply warm compresses to your eyes 2 times per day for 10 minutes at a time, or as told by your health care provider.  If you were prescribed an antibiotic ointment or steroid drops, apply or use the medicine as told by your health care provider. Do not stop using the medicine even if you feel better.  Keep all follow-up visits as told by your health care provider. This is important. SEEK MEDICAL CARE IF:  Your eyelids feel hot.  You have blisters or a rash on your eyelids.  The condition does not go away in 2-4 days.  The inflammation gets worse. SEEK IMMEDIATE MEDICAL CARE IF:  You have pain or redness that gets worse or spreads to other parts of your face.  Your vision changes.  You have pain when looking at lights or moving objects.  You have a fever.   This information is not intended to replace advice given to you by your health care provider. Make sure you discuss any questions you have with your health care  provider.   Document Released: 09/22/2000 Document Revised: 06/16/2015 Document Reviewed: 01/18/2015 Elsevier Interactive Patient Education 2016 [place]

## 2016-05-04 ENCOUNTER — Telehealth: Payer: Self-pay | Admitting: Pharmacist

## 2016-05-15 ENCOUNTER — Ambulatory Visit (INDEPENDENT_AMBULATORY_CARE_PROVIDER_SITE_OTHER): Payer: Medicare Other | Admitting: Nurse Practitioner

## 2016-05-15 ENCOUNTER — Encounter: Payer: Self-pay | Admitting: Nurse Practitioner

## 2016-05-15 VITALS — BP 133/66 | Temp 97.5°F | Ht 60.0 in | Wt 172.8 lb

## 2016-05-15 DIAGNOSIS — C50911 Malignant neoplasm of unspecified site of right female breast: Secondary | ICD-10-CM

## 2016-05-15 DIAGNOSIS — E079 Disorder of thyroid, unspecified: Secondary | ICD-10-CM

## 2016-05-15 DIAGNOSIS — Z6832 Body mass index (BMI) 32.0-32.9, adult: Secondary | ICD-10-CM | POA: Diagnosis not present

## 2016-05-15 DIAGNOSIS — I10 Essential (primary) hypertension: Secondary | ICD-10-CM | POA: Diagnosis not present

## 2016-05-15 DIAGNOSIS — E785 Hyperlipidemia, unspecified: Secondary | ICD-10-CM

## 2016-05-15 DIAGNOSIS — Z1212 Encounter for screening for malignant neoplasm of rectum: Secondary | ICD-10-CM

## 2016-05-15 DIAGNOSIS — I2510 Atherosclerotic heart disease of native coronary artery without angina pectoris: Secondary | ICD-10-CM | POA: Diagnosis not present

## 2016-05-15 DIAGNOSIS — K219 Gastro-esophageal reflux disease without esophagitis: Secondary | ICD-10-CM

## 2016-05-15 DIAGNOSIS — I2583 Coronary atherosclerosis due to lipid rich plaque: Secondary | ICD-10-CM

## 2016-05-15 MED ORDER — LEVOTHYROXINE SODIUM 75 MCG PO TABS: 75.0000 ug | ORAL_TABLET | Freq: Every day | ORAL | 1 refills | Status: DC

## 2016-05-15 MED ORDER — VERAPAMIL HCL 120 MG PO TABS: 120.0000 mg | ORAL_TABLET | Freq: Every day | ORAL | 1 refills | Status: DC

## 2016-05-15 MED ORDER — OMEPRAZOLE 40 MG PO CPDR: 40.0000 mg | DELAYED_RELEASE_CAPSULE | Freq: Every day | ORAL | 1 refills | Status: DC

## 2016-05-15 MED ORDER — LISINOPRIL 40 MG PO TABS: 40.0000 mg | ORAL_TABLET | Freq: Every day | ORAL | 1 refills | Status: DC

## 2016-05-15 MED ORDER — ATORVASTATIN CALCIUM 40 MG PO TABS: 40.0000 mg | ORAL_TABLET | Freq: Every day | ORAL | 1 refills | Status: DC

## 2016-05-15 NOTE — Progress Notes (Signed)
Subjective:    Patient ID: Bonnie Tanner, female    DOB: Dec 16, 1940, 75 y.o.   MRN: [id number]  Patient here today for follow up of chronic medical problems.  Outpatient Encounter Prescriptions as of 05/15/2016  Medication Sig  . aspirin 325 MG tablet Take 325 mg by mouth daily.    Marland Kitchen atorvastatin (LIPITOR) 40 MG tablet Take 1 tablet (40 mg total) by mouth daily.  . cholecalciferol (VITAMIN D) 1000 UNITS tablet Take 1,000 Units by mouth daily.    Marland Kitchen levothyroxine (SYNTHROID, LEVOTHROID) 75 MCG tablet Take 1 tablet (75 mcg total) by mouth daily before breakfast.  . lisinopril (PRINIVIL,ZESTRIL) 40 MG tablet Take 1 tablet (40 mg total) by mouth daily.  . multivitamin-iron-minerals-folic acid (CENTRUM) chewable tablet Chew 1 tablet by mouth daily.    Marland Kitchen omeprazole (PRILOSEC) 40 MG capsule Take 1 capsule (40 mg total) by mouth daily.  . verapamil (CALAN) 120 MG tablet Take 1 tablet (120 mg total) by mouth daily.    Hyperlipidemia  This is a chronic problem. The current episode started more than 1 year ago. The problem is controlled. Recent lipid tests were reviewed and are variable. Pertinent negatives include no chest pain or myalgias. Current antihyperlipidemic treatment includes statins. The current treatment provides moderate improvement of lipids. Compliance problems include adherence to diet and adherence to exercise.  Risk factors for coronary artery disease include dyslipidemia, hypertension, obesity, post-menopausal and a sedentary lifestyle.  Hypertension  This is a chronic problem. The current episode started more than 1 year ago. The problem is controlled. Pertinent negatives include no chest pain or palpitations. Risk factors for coronary artery disease include dyslipidemia, obesity, post-menopausal state and sedentary lifestyle. Past treatments include ACE inhibitors. The current treatment provides mild improvement. Compliance problems include diet and exercise.  Hypertensive end-organ  damage includes a thyroid problem. There is no history of CAD/MI or CVA.  Thyroid Problem  Presents for follow-up visit. Patient reports no cold intolerance, diarrhea, heat intolerance, palpitations or visual change. Her past medical history is significant for hyperlipidemia.  gerd Omeprazole works well- keeps symptoms under control CAD Currently on verapamil daily. Has not seen cardiologist in a long time. Denies any chest pain or shortness of breath. Hx breast cancer Right breast- had in 2011- had lumpectomy- did radiation- does not see oncologist anymore.  Review of Systems  Constitutional: Negative.   HENT: Negative.   Cardiovascular: Negative for chest pain and palpitations.  Gastrointestinal: Negative for diarrhea.  Endocrine: Negative for cold intolerance and heat intolerance.  Genitourinary: Negative.   Musculoskeletal: Negative for myalgias.  Neurological: Negative.   Psychiatric/Behavioral: Negative.   All other systems reviewed and are negative.      Objective:   Physical Exam  Constitutional: She is oriented to person, place, and time. She appears well-developed and well-nourished.  HENT:  Nose: Nose normal.  Mouth/Throat: Oropharynx is clear and moist.  Eyes: EOM are normal.  Neck: Trachea normal, normal range of motion and full passive range of motion without pain. Neck supple. No JVD present. Carotid bruit is not present. No thyromegaly present.  Cardiovascular: Normal rate, regular rhythm, normal heart sounds and intact distal pulses.  Exam reveals no gallop and no friction rub.   No murmur heard. Pulmonary/Chest: Effort normal and breath sounds normal.  Abdominal: Soft. Bowel sounds are normal. She exhibits no distension and no mass. There is no tenderness.  Musculoskeletal: Normal range of motion.  Lymphadenopathy:    She has no cervical adenopathy.  Neurological: She is alert and oriented to person, place, and time. She has normal reflexes.  Skin: Skin is  warm and dry.  Psychiatric: She has a normal mood and affect. Her behavior is normal. Judgment and thought content normal.   BP 133/66 (BP Location: Left Arm, Patient Position: Sitting, Cuff Size: Large)   Temp 97.5 F (36.4 C) (Oral)   Ht 5' (1.524 m)   Wt 172 lb 12.8 oz (78.4 kg)   BMI 33.75 kg/m       Assessment & Plan:   1. Essential hypertension, benign Do not add salt to diet - lisinopril (PRINIVIL,ZESTRIL) 40 MG tablet; Take 1 tablet (40 mg total) by mouth daily.  Dispense: 90 tablet; Refill: 1 - CMP14+EGFR  2. Gastroesophageal reflux disease without esophagitis Avoid spicy foods Do not eat 2 hours prior to bedtime - omeprazole (PRILOSEC) 40 MG capsule; Take 1 capsule (40 mg total) by mouth daily.  Dispense: 90 capsule; Refill: 1  3. Disorder of thyroid - levothyroxine (SYNTHROID, LEVOTHROID) 75 MCG tablet; Take 1 tablet (75 mcg total) by mouth daily before breakfast.  Dispense: 90 tablet; Refill: 1 - Thyroid Panel With TSH  4. Hyperlipidemia LDL goal <100 Low ft adiet - atorvastatin (LIPITOR) 40 MG tablet; Take 1 tablet (40 mg total) by mouth daily.  Dispense: 90 tablet; Refill: 1 - Lipid panel  5. BMI 32.0-32.9,adult Discussed diet and exercise for person with BMI >25 Will recheck weight in 3-6 months  6. Malignant neoplasm of right female breast, unspecified site of breast ([place]) - MM Digital Diagnostic Bilat; Future  7. Coronary artery disease due to lipid rich plaque - verapamil (CALAN) 120 MG tablet; Take 1 tablet (120 mg total) by mouth daily.  Dispense: 90 tablet; Refill: 1  8. Screening for malignant neoplasm of the rectum - Fecal occult blood, imunochemical; Future    Labs pending Health maintenance reviewed Diet and exercise encouraged Continue all meds Follow up  In 3 months   [place], FNP

## 2016-05-15 NOTE — Patient Instructions (Signed)
Fall Prevention in the Home  Falls can cause injuries and can affect people from all age groups. There are many simple things that you can do to make your home safe and to help prevent falls. WHAT CAN I DO ON THE OUTSIDE OF MY HOME?  Regularly repair the edges of walkways and driveways and fix any cracks.  Remove high doorway thresholds.  Trim any shrubbery on the main path into your home.  Use bright outdoor lighting.  Clear walkways of debris and clutter, including tools and rocks.  Regularly check that handrails are securely fastened and in good repair. Both sides of any steps should have handrails.  Install guardrails along the edges of any raised decks or porches.  Have leaves, snow, and ice cleared regularly.  Use sand or salt on walkways during winter months.  In the garage, clean up any spills right away, including grease or oil spills. WHAT CAN I DO IN THE BATHROOM?  Use night lights.  Install grab bars by the toilet and in the tub and shower. Do not use towel bars as grab bars.  Use non-skid mats or decals on the floor of the tub or shower.  If you need to sit down while you are in the shower, use a plastic, non-slip stool..  Keep the floor dry. Immediately clean up any water that spills on the floor.  Remove soap buildup in the tub or shower on a regular basis.  Attach bath mats securely with double-sided non-slip rug tape.  Remove throw rugs and other tripping hazards from the floor. WHAT CAN I DO IN THE BEDROOM?  Use night lights.  Make sure that a bedside light is easy to reach.  Do not use oversized bedding that drapes onto the floor.  Have a firm chair that has side arms to use for getting dressed.  Remove throw rugs and other tripping hazards from the floor. WHAT CAN I DO IN THE KITCHEN?   Clean up any spills right away.  Avoid walking on wet floors.  Place frequently used items in easy-to-reach places.  If you need to reach for something  above you, use a sturdy step stool that has a grab bar.  Keep electrical cables out of the way.  Do not use floor polish or wax that makes floors slippery. If you have to use wax, make sure that it is non-skid floor wax.  Remove throw rugs and other tripping hazards from the floor. WHAT CAN I DO IN THE STAIRWAYS?  Do not leave any items on the stairs.  Make sure that there are handrails on both sides of the stairs. Fix handrails that are broken or loose. Make sure that handrails are as long as the stairways.  Check any carpeting to make sure that it is firmly attached to the stairs. Fix any carpet that is loose or worn.  Avoid having throw rugs at the top or bottom of stairways, or secure the rugs with carpet tape to prevent them from moving.  Make sure that you have a light switch at the top of the stairs and the bottom of the stairs. If you do not have them, have them installed. WHAT ARE SOME OTHER FALL PREVENTION TIPS?  Wear closed-toe shoes that fit well and support your feet. Wear shoes that have rubber soles or low heels.  When you use a stepladder, make sure that it is completely opened and that the sides are firmly locked. Have someone hold the ladder while you   are using it. Do not climb a closed stepladder.  Add color or contrast paint or tape to grab bars and handrails in your home. Place contrasting color strips on the first and last steps.  Use mobility aids as needed, such as canes, walkers, scooters, and crutches.  Turn on lights if it is dark. Replace any light bulbs that burn out.  Set up furniture so that there are clear paths. Keep the furniture in the same spot.  Fix any uneven floor surfaces.  Choose a carpet design that does not hide the edge of steps of a stairway.  Be aware of any and all pets.  Review your medicines with your healthcare provider. Some medicines can cause dizziness or changes in blood pressure, which increase your risk of falling. Talk  with your health care provider about other ways that you can decrease your risk of falls. This may include working with a physical therapist or trainer to improve your strength, balance, and endurance.   This information is not intended to replace advice given to you by your health care provider. Make sure you discuss any questions you have with your health care provider.   Document Released: 09/15/2002 Document Revised: 02/09/2015 Document Reviewed: 10/30/2014 Elsevier Interactive Patient Education 2016 Elsevier Inc.  

## 2016-05-16 ENCOUNTER — Other Ambulatory Visit: Payer: Self-pay | Admitting: Nurse Practitioner

## 2016-05-16 LAB — CMP14+EGFR
A/G RATIO: 1.5 (ref 1.2–2.2)
ALBUMIN: 4.2 g/dL (ref 3.5–4.8)
ALK PHOS: 132 IU/L — AB (ref 39–117)
ALT: 21 IU/L (ref 0–32)
AST: 19 IU/L (ref 0–40)
BILIRUBIN TOTAL: 0.4 mg/dL (ref 0.0–1.2)
BUN / CREAT RATIO: 13 (ref 12–28)
BUN: 11 mg/dL (ref 8–27)
CHLORIDE: 100 mmol/L (ref 96–106)
CO2: 21 mmol/L (ref 18–29)
Calcium: 10.2 mg/dL (ref 8.7–10.3)
Creatinine, Ser: 0.84 mg/dL (ref 0.57–1.00)
GFR calc non Af Amer: 70 mL/min/{1.73_m2} (ref >59)
GFR, EST AFRICAN AMERICAN: 80 mL/min/{1.73_m2} (ref >59)
GLUCOSE: 90 mg/dL (ref 65–99)
Globulin, Total: 2.8 g/dL (ref 1.5–4.5)
POTASSIUM: 4.1 mmol/L (ref 3.5–5.2)
Sodium: 141 mmol/L (ref 134–144)
Total Protein: 7 g/dL (ref 6.0–8.5)

## 2016-05-16 LAB — LIPID PANEL
CHOLESTEROL TOTAL: 163 mg/dL (ref 100–199)
Chol/HDL Ratio: 2.5 ratio units (ref 0.0–4.4)
HDL: 64 mg/dL (ref >39)
LDL Calculated: 75 mg/dL (ref 0–99)
Triglycerides: 118 mg/dL (ref 0–149)
VLDL CHOLESTEROL CAL: 24 mg/dL (ref 5–40)

## 2016-05-16 LAB — THYROID PANEL WITH TSH
FREE THYROXINE INDEX: 2.3 (ref 1.2–4.9)
T3 Uptake Ratio: 25 % (ref 24–39)
T4 TOTAL: 9.2 ug/dL (ref 4.5–12.0)
TSH: 5.32 u[IU]/mL — AB (ref 0.450–4.500)

## 2016-05-16 MED ORDER — LEVOTHYROXINE SODIUM 88 MCG PO TABS: 88.0000 ug | ORAL_TABLET | Freq: Every day | ORAL | 1 refills | Status: DC

## 2016-05-16 NOTE — Progress Notes (Signed)
Increase levothyroxin dose from 75MCG to [facility]

## 2016-06-06 ENCOUNTER — Telehealth: Payer: Self-pay | Admitting: Nurse Practitioner

## 2016-06-06 NOTE — Telephone Encounter (Signed)
Patient aware that rx was [facility] 05/16/16.

## 2016-08-28 ENCOUNTER — Ambulatory Visit: Payer: Medicare Other

## 2016-09-05 ENCOUNTER — Ambulatory Visit (INDEPENDENT_AMBULATORY_CARE_PROVIDER_SITE_OTHER): Payer: Medicare Other | Admitting: *Deleted

## 2016-09-05 VITALS — BP 152/73 | HR 69 | Temp 98.0°F | Ht 59.5 in | Wt 169.0 lb

## 2016-09-05 DIAGNOSIS — Z0000 Encounter for general adult medical examination without abnormal findings: Secondary | ICD-10-CM | POA: Diagnosis not present

## 2016-09-05 DIAGNOSIS — Z23 Encounter for immunization: Secondary | ICD-10-CM

## 2016-09-05 DIAGNOSIS — Z853 Personal history of malignant neoplasm of breast: Secondary | ICD-10-CM

## 2016-09-05 NOTE — Progress Notes (Signed)
Subjective:   Bonnie Tanner is a 75 y.o. female who presents for a subsequent Medicare Annual Wellness Visit. Bonnie Tanner lives at home with her husband. 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 This keeps her very active. She is also active in her church. She has 1 son, 1 grandson and 1 granddaughter. Bonnie Tanner has 3 cats and one small dog at home. She is careful not to let them trip her.   Review of Systems    Reports that her health is the same as last year. Her health has really improved since recovering from cancer six years ago. She states that she feels well.   Cardiac Risk Factors include: obesity (BMI >30kg/m2);sedentary lifestyle;hypertension;dyslipidemia;advanced age (>20men, >51 women)  Other systems negative.   Objective:    Today's Vitals   09/05/16 1334  BP: (!) 152/73  Pulse: 69  Temp: 98 F (36.7 C)  TempSrc: Oral  Weight: 169 lb (76.7 kg)  Height: 4' 11.5" (1.511 m)   Body mass index is 33.56 kg/m.   Current Medications (verified) Outpatient Encounter Prescriptions as of 09/05/2016  Medication Sig  . aspirin 325 MG tablet Take 325 mg by mouth daily.    Marland Kitchen atorvastatin (LIPITOR) 40 MG tablet Take 1 tablet (40 mg total) by mouth daily.  . cholecalciferol (VITAMIN D) 1000 UNITS tablet Take 1,000 Units by mouth daily.    Marland Kitchen levothyroxine (SYNTHROID) 88 MCG tablet Take 1 tablet (88 mcg total) by mouth daily before breakfast.  . lisinopril (PRINIVIL,ZESTRIL) 40 MG tablet Take 1 tablet (40 mg total) by mouth daily.  . multivitamin-iron-minerals-folic acid (CENTRUM) chewable tablet Chew 1 tablet by mouth daily.    Marland Kitchen omeprazole (PRILOSEC) 40 MG capsule Take 1 capsule (40 mg total) by mouth daily.  . verapamil (CALAN) 120 MG tablet Take 1 tablet (120 mg total) by mouth daily.   No facility-administered encounter medications on file as of 09/05/2016.     Allergies (verified) Erythromycin; Latex; Nickel; Penicillins; and  Sulfonamide derivatives   History: Past Medical History:  Diagnosis Date  . Arthritis   . CAD (coronary artery disease)    Non obstructive  . Cancer [facility]) 2011   breast/radiation Tx/Lumpectomy  . GERD (gastroesophageal reflux disease)   . Heart murmur   . Hyperlipidemia   . Hypertension   . Hypothyroidism    Past Surgical History:  Procedure Laterality Date  . BREAST LUMPECTOMY  04/19/2010   radiation therapy right breast  . CARDIAC CATHETERIZATION  09/2009  . CATARACT EXTRACTION  2006   bilateral  . EYE SURGERY  2000   made her ducts   Family History  Problem Relation Age of Onset  . Heart disease Mother 23  . Emphysema Father   . Heart disease Father   . Throat cancer Brother   . Stroke Brother   . Healthy Son   . Prostate cancer Paternal Uncle   . Stomach cancer Paternal Uncle    Social History   Occupational History  . Beautician    Social History Main Topics  . Smoking status: Never Smoker  . Smokeless tobacco: Never Used  . Alcohol use No  . Drug use: No  . Sexual activity: Yes    Tobacco Counseling No tobacco use  Activities of Daily Living In your present state of health, do you have any difficulty performing the following activities: 09/05/2016  Hearing? Y  Vision? N  Difficulty concentrating or making  decisions? N  Walking or climbing stairs? N  Dressing or bathing? N  Doing errands, shopping? N  Preparing Food and eating ? N  Using the Toilet? N  In the past six months, have you accidently leaked urine? N  Do you have problems with loss of bowel control? N  Managing your Medications? N  Managing your Finances? N  Housekeeping or managing your Housekeeping? N  Some recent data might be hidden    Immunizations and Health Maintenance Immunization History  Administered Date(s) Administered  . Influenza Whole 07/09/2010  . Influenza,inj,Quad PF,36+ Mos 10/06/2013, 08/29/2014, 08/23/2015  . Pneumococcal Conjugate-13 02/02/2015  .  Pneumococcal Polysaccharide-23 10/21/2012  . Td 07/09/2009   Health Maintenance Due  Topic Date Due  . COLON CANCER SCREENING ANNUAL FOBT  11/25/2013  . INFLUENZA VACCINE  05/09/2016  . MAMMOGRAM  05/10/2016    Patient Care Team: Chevis Pretty, FNP as PCP - General (Nurse Practitioner) Jerene Bears, MD as Consulting Physician (Gastroenterology)  [facility] in [place], [place]      Assessment:   This is a routine wellness examination for Bonnie Tanner.   Hearing/Vision screen No hearing or vision deficits noted. She does report some hearing loss in her left ear but doesn't want to follow up with audiology at this time.   Dietary issues and exercise activities discussed: Current Exercise Habits: The patient has a physically strenous job, but has no regular exercise apart from work. (Patient walks a lot at work), Exercise limited by: None identified   Reports eating 2 main meals a day and a light lunch.  Goals    . Exercise 3x per week (30 min per time)          Walk for 30 minutes 3 times per week       Depression Screen PHQ 2/9 Scores 09/05/2016 05/15/2016 04/17/2016 11/11/2015 05/31/2015 02/22/2015 02/02/2015  PHQ - 2 Score 0 0 0 0 0 0 0    Fall Risk Fall Risk  09/05/2016 05/15/2016 04/17/2016 11/11/2015 05/31/2015  Falls in the past year? No No No No Yes  Number falls in past yr: - - - - 1  Injury with Fall? - - - - No    Cognitive Function: MMSE - Mini Mental State Exam 02/02/2015  Not completed: Refused  Orientation to time 5  Orientation to Place 5  Registration 3  Attention/ Calculation 5  Recall 3  Language- name 2 objects 2  Language- repeat 1  Language- follow 3 step command 3  Language- read & follow direction 1  Write a sentence 1  Copy design 1  Total score 30    Exceptional exam. Patient had no hesitation with any answers.     Screening Tests Health Maintenance  Topic Date Due  . COLON CANCER SCREENING ANNUAL FOBT  11/25/2013  . INFLUENZA VACCINE   05/09/2016  . MAMMOGRAM  05/10/2016  . ZOSTAVAX  05/15/2017 (Originally 08/07/2003)  . DEXA SCAN  02/01/2017  . COLONOSCOPY  11/25/2017  . TETANUS/TDAP  07/10/2019  . PNA vac Low Risk Adult  Completed      Plan:    Orders Placed This Encounter  Procedures  . MM Digital Diagnostic Bilat    Standing Status:   Future    Standing Expiration Date:   11/05/2017    Scheduling Instructions:     Please schedule on Monday at [facility]. They have her prior films. Last done 05/11/15.    Order Specific Question:   Reason for  Exam (SYMPTOM  OR DIAGNOSIS REQUIRED)    Answer:   Mammogram after history of right breast cancer in 2011    Order Specific Question:   Preferred imaging location?    Answer:   External    Comments:   [facility]    Review advance directives and bring copy to [place] once notarized and signed.  During the course of the visit, Bonnie Tanner was educated and counseled about the following appropriate screening and preventive services:   Vaccines to include Pneumoccal-Up to date, Influenza-given today, Tdap-up to date, Zostavax-postponed  Electrocardiogram-last done 2011  Cardiovascular disease screening-lipids done with routine labs  Colorectal cancer screening-done 2014  Bone density screening-done 2016  Diabetes screening-with routine labs  Glaucoma screening-pt to schedule at convenience  Mammography-ordered  Nutrition counseling   Patient Instructions (the written plan) were given to the patient.    Chong Sicilian, RN   09/05/2016   I have reviewed and agree with the above AWV documentation.   [name], FNP

## 2016-09-05 NOTE — Patient Instructions (Signed)
  Bonnie Tanner , Thank you for taking time to come for your Medicare Wellness Visit. I appreciate your ongoing commitment to your health goals. Please review the following plan we discussed and let me know if I can assist you in the future.   -Bring copy of signed and notarized Advanced Directives back to [place] to put on file.  -Appointment for mammogram will be scheduled. Call and ask for Carlon in the referral and xray department if you haven't heard anything within a week.    These are the goals we discussed: Goals    . Exercise 3x per week (30 min per time)          Walk for 30 minutes 3 times per week       This is a list of the screening recommended for you and due dates:  Health Maintenance  Topic Date Due  . Stool Blood Test  11/25/2013  . Flu Shot  05/09/2016  . Mammogram  05/10/2016  . Shingles Vaccine  05/15/2017*  . DEXA scan (bone density measurement)  02/01/2017  . Colon Cancer Screening  11/25/2017  . Tetanus Vaccine  07/10/2019  . Pneumonia vaccines  Completed  *Topic was postponed. The date shown is not the original due date.

## 2016-09-09 ENCOUNTER — Other Ambulatory Visit: Payer: Self-pay | Admitting: Nurse Practitioner

## 2016-09-09 DIAGNOSIS — I10 Essential (primary) hypertension: Secondary | ICD-10-CM

## 2016-09-09 DIAGNOSIS — K219 Gastro-esophageal reflux disease without esophagitis: Secondary | ICD-10-CM

## 2016-09-11 ENCOUNTER — Other Ambulatory Visit: Payer: Self-pay | Admitting: Nurse Practitioner

## 2016-09-11 DIAGNOSIS — I2510 Atherosclerotic heart disease of native coronary artery without angina pectoris: Secondary | ICD-10-CM

## 2016-09-11 DIAGNOSIS — I2583 Coronary atherosclerosis due to lipid rich plaque: Principal | ICD-10-CM

## 2016-09-25 ENCOUNTER — Encounter: Payer: Self-pay | Admitting: Nurse Practitioner

## 2016-09-25 ENCOUNTER — Ambulatory Visit (INDEPENDENT_AMBULATORY_CARE_PROVIDER_SITE_OTHER): Payer: Medicare Other | Admitting: Nurse Practitioner

## 2016-09-25 VITALS — BP 139/73 | HR 58 | Temp 97.1°F | Ht 59.0 in | Wt 168.0 lb

## 2016-09-25 DIAGNOSIS — I10 Essential (primary) hypertension: Secondary | ICD-10-CM | POA: Diagnosis not present

## 2016-09-25 DIAGNOSIS — E079 Disorder of thyroid, unspecified: Secondary | ICD-10-CM

## 2016-09-25 DIAGNOSIS — H524 Presbyopia: Secondary | ICD-10-CM | POA: Diagnosis not present

## 2016-09-25 DIAGNOSIS — H40033 Anatomical narrow angle, bilateral: Secondary | ICD-10-CM | POA: Diagnosis not present

## 2016-09-25 DIAGNOSIS — H52221 Regular astigmatism, right eye: Secondary | ICD-10-CM | POA: Diagnosis not present

## 2016-09-25 DIAGNOSIS — I2510 Atherosclerotic heart disease of native coronary artery without angina pectoris: Secondary | ICD-10-CM

## 2016-09-25 DIAGNOSIS — Z0000 Encounter for general adult medical examination without abnormal findings: Secondary | ICD-10-CM

## 2016-09-25 DIAGNOSIS — K219 Gastro-esophageal reflux disease without esophagitis: Secondary | ICD-10-CM

## 2016-09-25 DIAGNOSIS — Z170 Estrogen receptor positive status [ER+]: Secondary | ICD-10-CM

## 2016-09-25 DIAGNOSIS — Z6832 Body mass index (BMI) 32.0-32.9, adult: Secondary | ICD-10-CM

## 2016-09-25 DIAGNOSIS — E785 Hyperlipidemia, unspecified: Secondary | ICD-10-CM

## 2016-09-25 DIAGNOSIS — H5203 Hypermetropia, bilateral: Secondary | ICD-10-CM | POA: Diagnosis not present

## 2016-09-25 DIAGNOSIS — I2583 Coronary atherosclerosis due to lipid rich plaque: Secondary | ICD-10-CM

## 2016-09-25 DIAGNOSIS — C50911 Malignant neoplasm of unspecified site of right female breast: Secondary | ICD-10-CM

## 2016-09-25 MED ORDER — LISINOPRIL 40 MG PO TABS: 40.0000 mg | ORAL_TABLET | Freq: Every day | ORAL | 1 refills | Status: DC

## 2016-09-25 MED ORDER — OMEPRAZOLE 40 MG PO CPDR: 40.0000 mg | DELAYED_RELEASE_CAPSULE | Freq: Every day | ORAL | 1 refills | Status: DC

## 2016-09-25 MED ORDER — LEVOTHYROXINE SODIUM 88 MCG PO TABS: ORAL_TABLET | ORAL | 1 refills | Status: DC

## 2016-09-25 MED ORDER — ATORVASTATIN CALCIUM 40 MG PO TABS: 40.0000 mg | ORAL_TABLET | Freq: Every day | ORAL | 1 refills | Status: DC

## 2016-09-25 MED ORDER — VERAPAMIL HCL 120 MG PO TABS: 120.0000 mg | ORAL_TABLET | Freq: Every day | ORAL | 1 refills | Status: DC

## 2016-09-25 NOTE — Progress Notes (Signed)
Subjective:    Patient ID: Bonnie Tanner, female    DOB: January 19, 1941, 75 y.o.   MRN: [id number]  Patient here today for annual physical exam ( NO PAP) and follow up of chronic medical problems. There has been no changes since last visit. No complaints.  Outpatient Encounter Prescriptions as of 05/15/2016  Medication Sig  . aspirin 325 MG tablet Take 325 mg by mouth daily.    Marland Kitchen atorvastatin (LIPITOR) 40 MG tablet Take 1 tablet (40 mg total) by mouth daily.  . cholecalciferol (VITAMIN D) 1000 UNITS tablet Take 1,000 Units by mouth daily.    Marland Kitchen levothyroxine (SYNTHROID, LEVOTHROID) 75 MCG tablet Take 1 tablet (75 mcg total) by mouth daily before breakfast.  . lisinopril (PRINIVIL,ZESTRIL) 40 MG tablet Take 1 tablet (40 mg total) by mouth daily.  . multivitamin-iron-minerals-folic acid (CENTRUM) chewable tablet Chew 1 tablet by mouth daily.    Marland Kitchen omeprazole (PRILOSEC) 40 MG capsule Take 1 capsule (40 mg total) by mouth daily.  . verapamil (CALAN) 120 MG tablet Take 1 tablet (120 mg total) by mouth daily.    Hyperlipidemia  This is a chronic problem. The current episode started more than 1 year ago. The problem is controlled. Recent lipid tests were reviewed and are variable. Pertinent negatives include no chest pain or myalgias. Current antihyperlipidemic treatment includes statins. The current treatment provides moderate improvement of lipids. Compliance problems include adherence to diet and adherence to exercise.  Risk factors for coronary artery disease include dyslipidemia, hypertension, obesity, post-menopausal and a sedentary lifestyle.  Hypertension  This is a chronic problem. The current episode started more than 1 year ago. The problem is controlled. Pertinent negatives include no chest pain or palpitations. Risk factors for coronary artery disease include dyslipidemia, obesity, post-menopausal state and sedentary lifestyle. Past treatments include ACE inhibitors. The current treatment  provides mild improvement. Compliance problems include diet and exercise.  Hypertensive end-organ damage includes a thyroid problem. There is no history of CAD/MI or CVA.  Thyroid Problem  Presents for follow-up visit. Patient reports no cold intolerance, diarrhea, heat intolerance, palpitations or visual change. Her past medical history is significant for hyperlipidemia.  gerd Omeprazole works well- keeps symptoms under control CAD Currently on verapamil daily. Has not seen cardiologist in a long time. Denies any chest pain or shortness of breath. Hx breast cancer Right breast- had in 2011- had lumpectomy- did radiation- does not see oncologist anymore.  Review of Systems  Constitutional: Negative.   HENT: Negative.   Cardiovascular: Negative for chest pain and palpitations.  Gastrointestinal: Negative for diarrhea.  Endocrine: Negative for cold intolerance and heat intolerance.  Genitourinary: Negative.   Musculoskeletal: Negative for myalgias.  Neurological: Negative.   Psychiatric/Behavioral: Negative.   All other systems reviewed and are negative.      Objective:   Physical Exam  Constitutional: She is oriented to person, place, and time. She appears well-developed and well-nourished.  HENT:  Nose: Nose normal.  Mouth/Throat: Oropharynx is clear and moist.  Eyes: EOM are normal.  Neck: Trachea normal, normal range of motion and full passive range of motion without pain. Neck supple. No JVD present. Carotid bruit is not present. No thyromegaly present.  Cardiovascular: Normal rate, regular rhythm, normal heart sounds and intact distal pulses.  Exam reveals no gallop and no friction rub.   No murmur heard. Pulmonary/Chest: Effort normal and breath sounds normal.  Abdominal: Soft. Bowel sounds are normal. She exhibits no distension and no mass. There is no tenderness.  Musculoskeletal: Normal range of motion.  Lymphadenopathy:    She has no cervical adenopathy.  Neurological:  She is alert and oriented to person, place, and time. She has normal reflexes.  Skin: Skin is warm and dry.  Psychiatric: She has a normal mood and affect. Her behavior is normal. Judgment and thought content normal.   BP 139/73   Pulse (!) 58   Temp 97.1 F (36.2 C) (Oral)   Ht 4' 11" (1.499 m)   Wt 168 lb (76.2 kg)   BMI 33.93 kg/m        Assessment & Plan:  1. Annual physical exam - CBC with Differential/Platelet  2. Coronary artery disease due to lipid rich plaque Keep yearly follow up appointment with cardiology - verapamil (CALAN) 120 MG tablet; Take 1 tablet (120 mg total) by mouth daily.  Dispense: 90 tablet; Refill: 1  3. Essential hypertension, benign Low sodium diet - lisinopril (PRINIVIL,ZESTRIL) 40 MG tablet; Take 1 tablet (40 mg total) by mouth daily.  Dispense: 90 tablet; Refill: 1 - CMP14+EGFR  4. Gastroesophageal reflux disease without esophagitis Avoid spicy foods Do not eat 2 hours prior to bedtime - omeprazole (PRILOSEC) 40 MG capsule; Take 1 capsule (40 mg total) by mouth daily.  Dispense: 90 capsule; Refill: 1  5. Disorder of thyroid - levothyroxine (SYNTHROID, LEVOTHROID) 88 MCG tablet; TAKE 1 TABLET BY MOUTH  DAILY BEFORE BREAKFAST  Dispense: 90 tablet; Refill: 1 - Thyroid Panel With TSH  6. BMI 32.0-32.9,adult Discussed diet and exercise for person with BMI >25 Will recheck weight in 3-6 months  7. Hyperlipidemia LDL goal <100 Low fat diet - atorvastatin (LIPITOR) 40 MG tablet; Take 1 tablet (40 mg total) by mouth daily.  Dispense: 90 tablet; Refill: 1 - Lipid panel  8. Malignant neoplasm of right breast in female, estrogen receptor positive, unspecified site of breast ([place]) - MM Digital Screening; Future    Labs pending Health maintenance reviewed Diet and exercise encouraged Continue all meds Follow up  In 6 month   [place], FNP

## 2016-09-25 NOTE — Patient Instructions (Signed)
Fall Prevention in the Home Introduction Falls can cause injuries. They can happen to people of all ages. There are many things you can do to make your home safe and to help prevent falls. What can I do on the outside of my home?  Regularly fix the edges of walkways and driveways and fix any cracks.  Remove anything that might make you trip as you walk through a door, such as a raised step or threshold.  Trim any bushes or trees on the path to your home.  Use bright outdoor lighting.  Clear any walking paths of anything that might make someone trip, such as rocks or tools.  Regularly check to see if handrails are loose or broken. Make sure that both sides of any steps have handrails.  Any raised decks and porches should have guardrails on the edges.  Have any leaves, snow, or ice cleared regularly.  Use sand or salt on walking paths during winter.  Clean up any spills in your garage right away. This includes oil or grease spills. What can I do in the bathroom?  Use night lights.  Install grab bars by the toilet and in the tub and shower. Do not use towel bars as grab bars.  Use non-skid mats or decals in the tub or shower.  If you need to sit down in the shower, use a plastic, non-slip stool.  Keep the floor dry. Clean up any water that spills on the floor as soon as it happens.  Remove soap buildup in the tub or shower regularly.  Attach bath mats securely with double-sided non-slip rug tape.  Do not have throw rugs and other things on the floor that can make you trip. What can I do in the bedroom?  Use night lights.  Make sure that you have a light by your bed that is easy to reach.  Do not use any sheets or blankets that are too big for your bed. They should not hang down onto the floor.  Have a firm chair that has side arms. You can use this for support while you get dressed.  Do not have throw rugs and other things on the floor that can make you trip. What can  I do in the kitchen?  Clean up any spills right away.  Avoid walking on wet floors.  Keep items that you use a lot in easy-to-reach places.  If you need to reach something above you, use a strong step stool that has a grab bar.  Keep electrical cords out of the way.  Do not use floor polish or wax that makes floors slippery. If you must use wax, use non-skid floor wax.  Do not have throw rugs and other things on the floor that can make you trip. What can I do with my stairs?  Do not leave any items on the stairs.  Make sure that there are handrails on both sides of the stairs and use them. Fix handrails that are broken or loose. Make sure that handrails are as long as the stairways.  Check any carpeting to make sure that it is firmly attached to the stairs. Fix any carpet that is loose or worn.  Avoid having throw rugs at the top or bottom of the stairs. If you do have throw rugs, attach them to the floor with carpet tape.  Make sure that you have a light switch at the top of the stairs and the bottom of the stairs. If you   do not have them, ask someone to add them for you. What else can I do to help prevent falls?  Wear shoes that:  Do not have high heels.  Have rubber bottoms.  Are comfortable and fit you well.  Are closed at the toe. Do not wear sandals.  If you use a stepladder:  Make sure that it is fully opened. Do not climb a closed stepladder.  Make sure that both sides of the stepladder are locked into place.  Ask someone to hold it for you, if possible.  Clearly mark and make sure that you can see:  Any grab bars or handrails.  First and last steps.  Where the edge of each step is.  Use tools that help you move around (mobility aids) if they are needed. These include:  Canes.  Walkers.  Scooters.  Crutches.  Turn on the lights when you go into a dark area. Replace any light bulbs as soon as they burn out.  Set up your furniture so you have a  clear path. Avoid moving your furniture around.  If any of your floors are uneven, fix them.  If there are any pets around you, be aware of where they are.  Review your medicines with your doctor. Some medicines can make you feel dizzy. This can increase your chance of falling. Ask your doctor what other things that you can do to help prevent falls. This information is not intended to replace advice given to you by your health care provider. Make sure you discuss any questions you have with your health care provider. Document Released: 07/22/2009 Document Revised: 03/02/2016 Document Reviewed: 10/30/2014  2017 Elsevier  

## 2016-09-26 LAB — CBC WITH DIFFERENTIAL/PLATELET
BASOS ABS: 0.1 10*3/uL (ref 0.0–0.2)
Basos: 1 %
EOS (ABSOLUTE): 0.4 10*3/uL (ref 0.0–0.4)
EOS: 6 %
HEMOGLOBIN: 12.8 g/dL (ref 11.1–15.9)
Hematocrit: 37.6 % (ref 34.0–46.6)
IMMATURE GRANULOCYTES: 0 %
Immature Grans (Abs): 0 10*3/uL (ref 0.0–0.1)
LYMPHS: 35 %
Lymphocytes Absolute: 2.5 10*3/uL (ref 0.7–3.1)
MCH: 33 pg (ref 26.6–33.0)
MCHC: 34 g/dL (ref 31.5–35.7)
MCV: 97 fL (ref 79–97)
MONOCYTES: 6 %
Monocytes Absolute: 0.5 10*3/uL (ref 0.1–0.9)
Neutrophils Absolute: 3.6 10*3/uL (ref 1.4–7.0)
Neutrophils: 52 %
Platelets: 258 10*3/uL (ref 150–379)
RBC: 3.88 x10E6/uL (ref 3.77–5.28)
RDW: 13.6 % (ref 12.3–15.4)
WBC: 7 10*3/uL (ref 3.4–10.8)

## 2016-09-26 LAB — CMP14+EGFR
A/G RATIO: 1.7 (ref 1.2–2.2)
ALBUMIN: 4.5 g/dL (ref 3.5–4.8)
ALT: 45 IU/L — AB (ref 0–32)
AST: 38 IU/L (ref 0–40)
Alkaline Phosphatase: 171 IU/L — ABNORMAL HIGH (ref 39–117)
BUN / CREAT RATIO: 22 (ref 12–28)
BUN: 17 mg/dL (ref 8–27)
Bilirubin Total: 0.4 mg/dL (ref 0.0–1.2)
CALCIUM: 10.3 mg/dL (ref 8.7–10.3)
CO2: 28 mmol/L (ref 18–29)
Chloride: 101 mmol/L (ref 96–106)
Creatinine, Ser: 0.79 mg/dL (ref 0.57–1.00)
GFR, EST AFRICAN AMERICAN: 86 mL/min/{1.73_m2} (ref >59)
GFR, EST NON AFRICAN AMERICAN: 74 mL/min/{1.73_m2} (ref >59)
GLOBULIN, TOTAL: 2.6 g/dL (ref 1.5–4.5)
Glucose: 89 mg/dL (ref 65–99)
POTASSIUM: 4.5 mmol/L (ref 3.5–5.2)
Sodium: 141 mmol/L (ref 134–144)
TOTAL PROTEIN: 7.1 g/dL (ref 6.0–8.5)

## 2016-09-26 LAB — THYROID PANEL WITH TSH
FREE THYROXINE INDEX: 2.7 (ref 1.2–4.9)
T3 UPTAKE RATIO: 25 % (ref 24–39)
T4 TOTAL: 10.7 ug/dL (ref 4.5–12.0)
TSH: 3.13 u[IU]/mL (ref 0.450–4.500)

## 2016-09-26 LAB — LIPID PANEL
CHOL/HDL RATIO: 2.8 ratio (ref 0.0–4.4)
Cholesterol, Total: 160 mg/dL (ref 100–199)
HDL: 58 mg/dL (ref >39)
LDL Calculated: 76 mg/dL (ref 0–99)
Triglycerides: 130 mg/dL (ref 0–149)
VLDL Cholesterol Cal: 26 mg/dL (ref 5–40)

## 2017-02-05 ENCOUNTER — Encounter: Payer: Medicare Other | Admitting: *Deleted

## 2017-03-31 ENCOUNTER — Other Ambulatory Visit: Payer: Self-pay | Admitting: Nurse Practitioner

## 2017-03-31 DIAGNOSIS — E079 Disorder of thyroid, unspecified: Secondary | ICD-10-CM

## 2017-03-31 DIAGNOSIS — E785 Hyperlipidemia, unspecified: Secondary | ICD-10-CM

## 2017-04-17 ENCOUNTER — Ambulatory Visit (INDEPENDENT_AMBULATORY_CARE_PROVIDER_SITE_OTHER): Payer: Medicare Other | Admitting: Family

## 2017-04-17 ENCOUNTER — Encounter: Payer: Self-pay | Admitting: Family

## 2017-04-17 VITALS — BP 142/72 | HR 71 | Temp 97.8°F | Ht 59.0 in | Wt 168.0 lb

## 2017-04-17 DIAGNOSIS — L539 Erythematous condition, unspecified: Secondary | ICD-10-CM | POA: Diagnosis not present

## 2017-04-17 DIAGNOSIS — N61 Mastitis without abscess: Secondary | ICD-10-CM | POA: Diagnosis not present

## 2017-04-17 MED ORDER — CEPHALEXIN 500 MG PO CAPS: 500.0000 mg | ORAL_CAPSULE | Freq: Three times a day (TID) | ORAL | 0 refills | Status: AC

## 2017-04-17 NOTE — Progress Notes (Signed)
   Subjective:    Patient ID: Bonnie Tanner, female    DOB: 04-May-1941, 76 y.o.   MRN: [id number]  Pt presents to the office today with a rash on right breast. Pt has hx of right breast cancer with xrt 2011.  PT states her last mammogram was 05/2015.  Rash  This is a new problem. The current episode started in the past 7 days. The problem has been gradually improving since onset. The affected locations include the chest (right breast). The rash is characterized by pain, redness and itchiness. She was exposed to nothing. Associated symptoms include a fever. Pertinent negatives include no congestion, cough, shortness of breath, sore throat or vomiting. Past treatments include moisturizer.      Review of Systems  Constitutional: Positive for fever.  HENT: Negative for congestion and sore throat.   Respiratory: Negative for cough and shortness of breath.   Gastrointestinal: Negative for vomiting.  Skin: Positive for rash.  All other systems reviewed and are negative.      Objective:   Physical Exam  Constitutional: She is oriented to person, place, and time. She appears well-developed and well-nourished. No distress.  HENT:  Head: Normocephalic.  Cardiovascular: Normal rate, regular rhythm, normal heart sounds and intact distal pulses.   No murmur heard. Pulmonary/Chest: Effort normal and breath sounds normal. No respiratory distress. She has no wheezes. Right breast exhibits skin change (erythemas present throughout breast, warmth present) and tenderness. Nipple discharge: nipple swollen and tender.  Abdominal: Soft. Bowel sounds are normal. She exhibits no distension. There is no tenderness.  Musculoskeletal: Normal range of motion. She exhibits no edema or tenderness.  Neurological: She is alert and oriented to person, place, and time. She has normal reflexes. No cranial nerve deficit.  Skin: Skin is warm and dry. Rash noted.  Psychiatric: She has a normal mood and affect. Her  behavior is normal. Judgment and thought content normal.  Vitals reviewed.     BP (!) 142/72   Pulse 71   Temp 97.8 F (36.6 C) (Oral)   Ht 4\' 11"  (1.499 m)   Wt 168 lb (76.2 kg)   BMI 33.93 kg/m      Assessment & Plan:  1. Erythema of breast - MM Digital Diagnostic Unilat R; Future - cephALEXin (KEFLEX) 500 MG capsule; Take 1 capsule (500 mg total) by mouth 3 (three) times daily.  Dispense: 30 capsule; Refill: 0  2. Nipple inflammation - MM Digital Diagnostic Unilat R; Future - cephALEXin (KEFLEX) 500 MG capsule; Take 1 capsule (500 mg total) by mouth 3 (three) times daily.  Dispense: 30 capsule; Refill: 0  Mastitis vs inflammatory breast cancer Very worrisome for IBC with pt's previous hx of breast cancer Will treat with keflex while waiting for stat diagnostic mammogram  Discussed self breast exams  Evelina Dun, FNP

## 2017-04-17 NOTE — Patient Instructions (Signed)
Breast Self-Awareness Breast self-awareness means:  Knowing how your breasts look.  Knowing how your breasts feel.  Checking your breasts every month for changes.  Telling your doctor if you notice a change in your breasts.  Breast self-awareness allows you to notice a breast problem early while it is still small. How to do a breast self-exam One way to learn what is normal for your breasts and to check for changes is to do a breast self-exam. To do a breast self-exam: Look for Changes  1. Take off all the clothes above your waist. 2. Stand in front of a mirror in a room with good lighting. 3. Put your hands on your hips. 4. Push your hands down. 5. Look at your breasts and nipples in the mirror to see if one breast or nipple looks different than the other. Check to see if: ? The shape of one breast is different. ? The size of one breast is different. ? There are wrinkles, dips, and bumps in one breast and not the other. 6. Look at each breast for changes in your skin, such as: ? Redness. ? Scaly areas. 7. Look for changes in your nipples, such as: ? Liquid around the nipples. ? Bleeding. ? Dimpling. ? Redness. ? A change in where the nipples are. Feel for Changes 1. Lie on your back on the floor. 2. Feel each breast. To do this, follow these steps: ? Pick a breast to feel. ? Put the arm closest to that breast above your head. ? Use your other arm to feel the nipple area of your breast. Feel the area with the pads of your three middle fingers by making small circles with your fingers. For the first circle, press lightly. For the second circle, press harder. For the third circle, press even harder. ? Keep making circles with your fingers at the light, harder, and even harder pressures as you move down your breast. Stop when you feel your ribs. ? Move your fingers a little toward the center of your body. ? Start making circles with your fingers again, this time going up until  you reach your collarbone. ? Keep making up and down circles until you reach your armpit. Remember to keep using the three pressures. ? Feel the other breast in the same way. 3. Sit or stand in the shower or tub. 4. With soapy water on your skin, feel each breast the same way you did in step 2, when you were lying on the floor. Write Down What You Find  After doing the self-exam, write down:  What is normal for each breast.  Any changes you find in each breast.  When you last had your period.  How often should I check my breasts? Check your breasts every month. If you are breastfeeding, the best time to check them is after you feed your baby or after you use a breast pump. If you get periods, the best time to check your breasts is 5-7 days after your period is over. When should I see my doctor? See your doctor if you notice:  A change in shape or size of your breasts or nipples.  A change in the skin of your breast or nipples, such as red or scaly skin.  Unusual fluid coming from your nipples.  A lump or thick area that was not there before.  Pain in your breasts.  Anything that concerns you.  This information is not intended to replace advice given to   you by your health care provider. Make sure you discuss any questions you have with your health care provider. Document Released: 03/13/2008 Document Revised: 03/02/2016 Document Reviewed: 08/15/2015 Elsevier Interactive Patient Education  2018 [employer]vier Inc.  

## 2017-05-07 ENCOUNTER — Telehealth: Payer: Self-pay | Admitting: *Deleted

## 2017-05-07 NOTE — Telephone Encounter (Signed)
She still has some irritation on her breast. She has a mammogram in 2 weeks and wants one more dose of antibiotic called in? Please call pt. Route to pool A

## 2017-05-08 NOTE — Telephone Encounter (Signed)
Please try to get this mammogram earlier. Thanks!

## 2017-05-11 NOTE — Telephone Encounter (Signed)
Pt is limited as to when she can go. [facility] has the order and will contact her to get an appointment that is convenient for her

## 2017-05-21 ENCOUNTER — Telehealth: Payer: Self-pay | Admitting: Nurse Practitioner

## 2017-05-21 DIAGNOSIS — I10 Essential (primary) hypertension: Secondary | ICD-10-CM

## 2017-05-22 MED ORDER — LISINOPRIL 40 MG PO TABS: 40.0000 mg | ORAL_TABLET | Freq: Every day | ORAL | 1 refills | Status: DC

## 2017-05-22 MED ORDER — LISINOPRIL 40 MG PO TABS: 40.0000 mg | ORAL_TABLET | Freq: Every day | ORAL | 0 refills | Status: DC

## 2017-05-22 NOTE — Telephone Encounter (Signed)
Sent in 30 day rx to [facility]- it cost the same weather you get a 1 week supply or 1  Month supply- also sent in rx to mail order

## 2017-05-23 ENCOUNTER — Other Ambulatory Visit: Payer: Self-pay | Admitting: Nurse Practitioner

## 2017-05-23 DIAGNOSIS — I2583 Coronary atherosclerosis due to lipid rich plaque: Secondary | ICD-10-CM

## 2017-05-23 DIAGNOSIS — K219 Gastro-esophageal reflux disease without esophagitis: Secondary | ICD-10-CM

## 2017-05-23 DIAGNOSIS — I2510 Atherosclerotic heart disease of native coronary artery without angina pectoris: Secondary | ICD-10-CM

## 2017-06-04 DIAGNOSIS — Z853 Personal history of malignant neoplasm of breast: Secondary | ICD-10-CM | POA: Diagnosis not present

## 2017-06-04 LAB — HM MAMMOGRAPHY

## 2017-07-07 ENCOUNTER — Other Ambulatory Visit: Payer: Self-pay | Admitting: Nurse Practitioner

## 2017-07-07 DIAGNOSIS — E785 Hyperlipidemia, unspecified: Secondary | ICD-10-CM

## 2017-08-06 ENCOUNTER — Ambulatory Visit (INDEPENDENT_AMBULATORY_CARE_PROVIDER_SITE_OTHER): Payer: Medicare Other

## 2017-08-06 DIAGNOSIS — Z23 Encounter for immunization: Secondary | ICD-10-CM | POA: Diagnosis not present

## 2017-09-08 ENCOUNTER — Other Ambulatory Visit: Payer: Self-pay | Admitting: Nurse Practitioner

## 2017-09-08 DIAGNOSIS — E079 Disorder of thyroid, unspecified: Secondary | ICD-10-CM

## 2017-09-08 DIAGNOSIS — E785 Hyperlipidemia, unspecified: Secondary | ICD-10-CM

## 2017-09-29 ENCOUNTER — Other Ambulatory Visit: Payer: Self-pay | Admitting: Nurse Practitioner

## 2017-09-29 DIAGNOSIS — E079 Disorder of thyroid, unspecified: Secondary | ICD-10-CM

## 2017-09-29 DIAGNOSIS — E785 Hyperlipidemia, unspecified: Secondary | ICD-10-CM

## 2017-10-03 NOTE — Telephone Encounter (Signed)
Last Thyroid 09/25/16  MMM

## 2017-10-04 ENCOUNTER — Other Ambulatory Visit: Payer: Self-pay | Admitting: *Deleted

## 2017-10-04 DIAGNOSIS — E785 Hyperlipidemia, unspecified: Secondary | ICD-10-CM

## 2017-10-04 MED ORDER — ATORVASTATIN CALCIUM 40 MG PO TABS: 40.0000 mg | ORAL_TABLET | Freq: Every day | ORAL | 1 refills | Status: DC

## 2017-10-04 NOTE — Telephone Encounter (Signed)
Last refill without being seen 

## 2017-11-17 ENCOUNTER — Other Ambulatory Visit: Payer: Self-pay | Admitting: Nurse Practitioner

## 2017-11-17 DIAGNOSIS — K219 Gastro-esophageal reflux disease without esophagitis: Secondary | ICD-10-CM

## 2017-11-17 DIAGNOSIS — I2510 Atherosclerotic heart disease of native coronary artery without angina pectoris: Secondary | ICD-10-CM

## 2017-11-17 DIAGNOSIS — I10 Essential (primary) hypertension: Secondary | ICD-10-CM

## 2017-11-17 DIAGNOSIS — I2583 Coronary atherosclerosis due to lipid rich plaque: Secondary | ICD-10-CM

## 2017-11-19 NOTE — Telephone Encounter (Signed)
Last refill without being seen 

## 2017-11-19 NOTE — Telephone Encounter (Signed)
Last seen 04/17/17  MMM

## 2017-11-22 ENCOUNTER — Other Ambulatory Visit: Payer: Self-pay | Admitting: Nurse Practitioner

## 2017-11-22 DIAGNOSIS — E079 Disorder of thyroid, unspecified: Secondary | ICD-10-CM

## 2017-11-22 NOTE — Telephone Encounter (Signed)
Last refill without being seen 

## 2017-11-22 NOTE — Telephone Encounter (Signed)
Last seen 7/18  MMM Last thyroid 09/25/16

## 2017-12-15 ENCOUNTER — Encounter: Payer: Self-pay | Admitting: Internal Medicine

## 2018-01-22 ENCOUNTER — Other Ambulatory Visit: Payer: Self-pay | Admitting: Nurse Practitioner

## 2018-01-22 DIAGNOSIS — I2510 Atherosclerotic heart disease of native coronary artery without angina pectoris: Secondary | ICD-10-CM

## 2018-01-22 DIAGNOSIS — K219 Gastro-esophageal reflux disease without esophagitis: Secondary | ICD-10-CM

## 2018-01-22 DIAGNOSIS — I2583 Coronary atherosclerosis due to lipid rich plaque: Secondary | ICD-10-CM

## 2018-01-22 DIAGNOSIS — I10 Essential (primary) hypertension: Secondary | ICD-10-CM

## 2018-01-22 NOTE — Telephone Encounter (Signed)
Last seen 04/17/17  MMM

## 2018-01-24 ENCOUNTER — Other Ambulatory Visit: Payer: Self-pay | Admitting: Nurse Practitioner

## 2018-01-24 DIAGNOSIS — E079 Disorder of thyroid, unspecified: Secondary | ICD-10-CM

## 2018-01-24 NOTE — Telephone Encounter (Signed)
Needs to be seen, last exam 09/2016

## 2018-01-24 NOTE — Telephone Encounter (Signed)
Last thyroid 10/05/16  MMM

## 2018-01-24 NOTE — Telephone Encounter (Signed)
Appt made to be seen

## 2018-02-04 ENCOUNTER — Ambulatory Visit (INDEPENDENT_AMBULATORY_CARE_PROVIDER_SITE_OTHER): Payer: Medicare Other | Admitting: Nurse Practitioner

## 2018-02-04 ENCOUNTER — Ambulatory Visit (INDEPENDENT_AMBULATORY_CARE_PROVIDER_SITE_OTHER): Payer: Medicare Other

## 2018-02-04 ENCOUNTER — Encounter: Payer: Self-pay | Admitting: Nurse Practitioner

## 2018-02-04 VITALS — BP 126/56 | HR 59 | Temp 97.2°F | Ht 59.0 in | Wt 169.0 lb

## 2018-02-04 DIAGNOSIS — I2510 Atherosclerotic heart disease of native coronary artery without angina pectoris: Secondary | ICD-10-CM

## 2018-02-04 DIAGNOSIS — I2583 Coronary atherosclerosis due to lipid rich plaque: Secondary | ICD-10-CM | POA: Diagnosis not present

## 2018-02-04 DIAGNOSIS — E039 Hypothyroidism, unspecified: Secondary | ICD-10-CM

## 2018-02-04 DIAGNOSIS — Z6832 Body mass index (BMI) 32.0-32.9, adult: Secondary | ICD-10-CM | POA: Diagnosis not present

## 2018-02-04 DIAGNOSIS — Z1212 Encounter for screening for malignant neoplasm of rectum: Secondary | ICD-10-CM

## 2018-02-04 DIAGNOSIS — Z1211 Encounter for screening for malignant neoplasm of colon: Secondary | ICD-10-CM

## 2018-02-04 DIAGNOSIS — Z170 Estrogen receptor positive status [ER+]: Secondary | ICD-10-CM | POA: Diagnosis not present

## 2018-02-04 DIAGNOSIS — E785 Hyperlipidemia, unspecified: Secondary | ICD-10-CM | POA: Diagnosis not present

## 2018-02-04 DIAGNOSIS — I10 Essential (primary) hypertension: Secondary | ICD-10-CM | POA: Diagnosis not present

## 2018-02-04 DIAGNOSIS — K219 Gastro-esophageal reflux disease without esophagitis: Secondary | ICD-10-CM | POA: Diagnosis not present

## 2018-02-04 DIAGNOSIS — Z780 Asymptomatic menopausal state: Secondary | ICD-10-CM | POA: Diagnosis not present

## 2018-02-04 DIAGNOSIS — Z13820 Encounter for screening for osteoporosis: Secondary | ICD-10-CM | POA: Diagnosis not present

## 2018-02-04 DIAGNOSIS — C50911 Malignant neoplasm of unspecified site of right female breast: Secondary | ICD-10-CM | POA: Diagnosis not present

## 2018-02-04 IMAGING — DX DG CHEST 2V
2 series · 2 of 2 positions shown · non-contrast
Comparison: October 06, 2009

CLINICAL DATA: Coronary artery disease

EXAM:
CHEST - 2 VIEW

[chest ap]
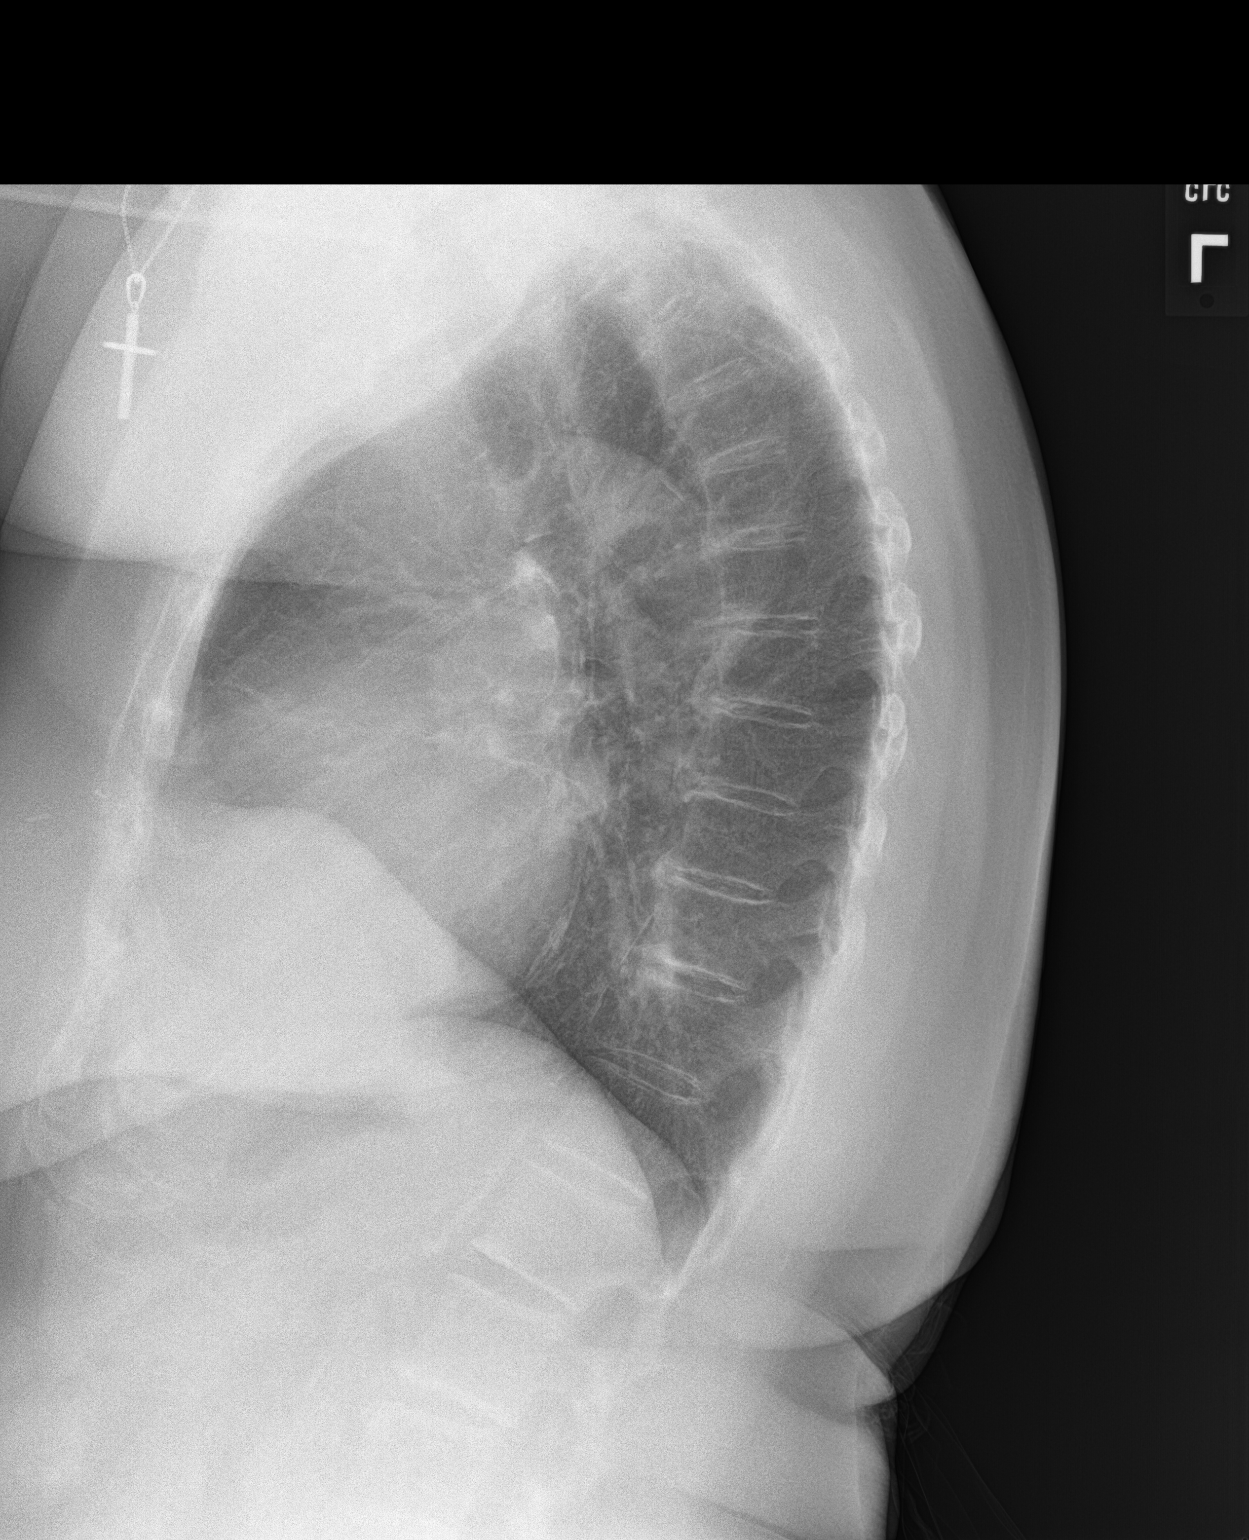

[chest pa]
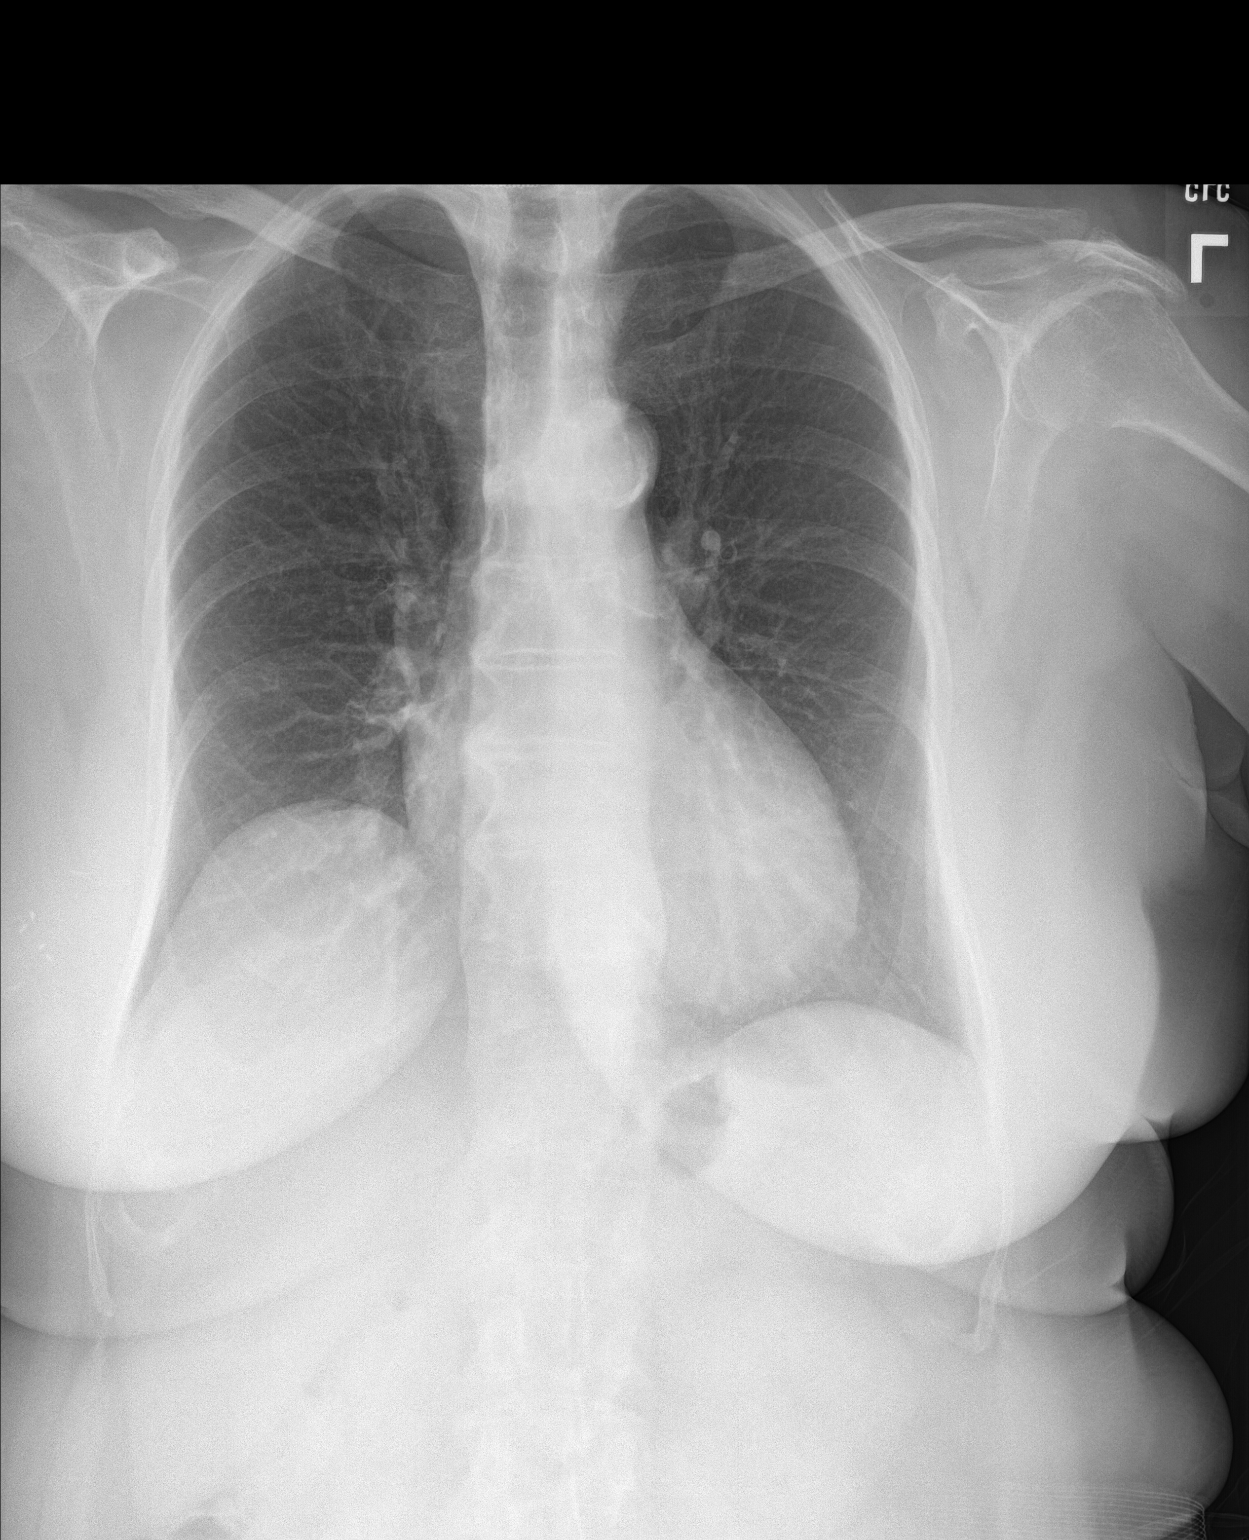

[2 of 2 positions shown; findings below may reference images not displayed]

FINDINGS: There is no edema or consolidation. Heart size and pulmonary
vascularity are normal. No adenopathy. There is aortic
atherosclerosis. There is mild degenerative change in the thoracic
spine.
IMPRESSION: Aortic atherosclerosis. No edema or consolidation. Heart size
normal.

Aortic Atherosclerosis ([id number]).

## 2018-02-04 MED ORDER — ATORVASTATIN CALCIUM 40 MG PO TABS: 40.0000 mg | ORAL_TABLET | Freq: Every day | ORAL | 1 refills | Status: DC

## 2018-02-04 MED ORDER — LEVOTHYROXINE SODIUM 88 MCG PO TABS: ORAL_TABLET | ORAL | 1 refills | Status: DC

## 2018-02-04 MED ORDER — VERAPAMIL HCL 120 MG PO TABS: 120.0000 mg | ORAL_TABLET | Freq: Every day | ORAL | 1 refills | Status: DC

## 2018-02-04 MED ORDER — LISINOPRIL 40 MG PO TABS: 40.0000 mg | ORAL_TABLET | Freq: Every day | ORAL | 1 refills | Status: DC

## 2018-02-04 MED ORDER — OMEPRAZOLE 40 MG PO CPDR: 40.0000 mg | DELAYED_RELEASE_CAPSULE | Freq: Every day | ORAL | 1 refills | Status: DC

## 2018-02-04 NOTE — Patient Instructions (Signed)
Bone Health Bones protect organs, store calcium, and anchor muscles. Good health habits, such as eating nutritious foods and exercising regularly, are important for maintaining healthy bones. They can also help to prevent a condition that causes bones to lose density and become weak and brittle (osteoporosis). Why is bone mass important? Bone mass refers to the amount of bone tissue that you have. The higher your bone mass, the stronger your bones. An important step toward having healthy bones throughout life is to have strong and dense bones during childhood. A young adult who has a high bone mass is more likely to have a high bone mass later in life. Bone mass at its greatest it is called peak bone mass. A large decline in bone mass occurs in older adults. In women, it occurs about the time of menopause. During this time, it is important to practice good health habits, because if more bone is lost than what is replaced, the bones will become less healthy and more likely to break (fracture). If you find that you have a low bone mass, you may be able to prevent osteoporosis or further bone loss by changing your diet and lifestyle. How can I find out if my bone mass is low? Bone mass can be measured with an X-ray test that is called a bone mineral density (BMD) test. This test is recommended for all women who are age 65 or older. It may also be recommended for men who are age 70 or older, or for people who are more likely to develop osteoporosis due to:  Having bones that break easily.  Having a long-term disease that weakens bones, such as kidney disease or rheumatoid arthritis.  Having menopause earlier than normal.  Taking medicine that weakens bones, such as steroids, thyroid hormones, or hormone treatment for breast cancer or prostate cancer.  Smoking.  Drinking three or more alcoholic drinks each day.  What are the nutritional recommendations for healthy bones? To have healthy bones, you  need to get enough of the right minerals and vitamins. Most nutrition experts recommend getting these nutrients from the foods that you eat. Nutritional recommendations vary from person to person. Ask your health care provider what is healthy for you. Here are some general guidelines. Calcium Recommendations Calcium is the most important (essential) mineral for bone health. Most people can get enough calcium from their diet, but supplements may be recommended for people who are at risk for osteoporosis. Good sources of calcium include:  Dairy products, such as low-fat or nonfat milk, cheese, and yogurt.  Dark green leafy vegetables, such as bok choy and broccoli.  Calcium-fortified foods, such as orange juice, cereal, bread, soy beverages, and tofu products.  Nuts, such as almonds.  Follow these recommended amounts for daily calcium intake:  Children, age 1?3: 700 mg.  Children, age 4?8: 1,000 mg.  Children, age 9?13: 1,300 mg.  Teens, age 14?18: 1,300 mg.  Adults, age 19?50: 1,000 mg.  Adults, age 51?70: ? Men: 1,000 mg. ? Women: 1,200 mg.  Adults, age 71 or older: 1,200 mg.  Pregnant and breastfeeding females: ? Teens: 1,300 mg. ? Adults: 1,000 mg.  Vitamin D Recommendations Vitamin D is the most essential vitamin for bone health. It helps the body to absorb calcium. Sunlight stimulates the skin to make vitamin D, so be sure to get enough sunlight. If you live in a cold climate or you do not get outside often, your health care provider may recommend that you take vitamin   D supplements. Good sources of vitamin D in your diet include:  Egg yolks.  Saltwater fish.  Milk and cereal fortified with vitamin D.  Follow these recommended amounts for daily vitamin D intake:  Children and teens, age 1?18: 600 international units.  Adults, age 50 or younger: 400-800 international units.  Adults, age 51 or older: 800-1,000 international units.  Other Nutrients Other nutrients  for bone health include:  Phosphorus. This mineral is found in meat, poultry, dairy foods, nuts, and legumes. The recommended daily intake for adult men and adult women is 700 mg.  Magnesium. This mineral is found in seeds, nuts, dark green vegetables, and legumes. The recommended daily intake for adult men is 400?420 mg. For adult women, it is 310?320 mg.  Vitamin K. This vitamin is found in green leafy vegetables. The recommended daily intake is 120 mg for adult men and 90 mg for adult women.  What type of physical activity is best for building and maintaining healthy bones? Weight-bearing and strength-building activities are important for building and maintaining peak bone mass. Weight-bearing activities cause muscles and bones to work against gravity. Strength-building activities increases muscle strength that supports bones. Weight-bearing and muscle-building activities include:  Walking and hiking.  Jogging and running.  Dancing.  Gym exercises.  Lifting weights.  Tennis and racquetball.  Climbing stairs.  Aerobics.  Adults should get at least 30 minutes of moderate physical activity on most days. Children should get at least 60 minutes of moderate physical activity on most days. Ask your health care provide what type of exercise is best for you. Where can I find more information? For more information, check out the following websites:  National Osteoporosis Foundation: http://nof.org/learn/basics  National Institutes of Health: http://www.niams.nih.gov/Health_Info/Bone/Bone_Health/bone_health_for_life.asp  This information is not intended to replace advice given to you by your health care provider. Make sure you discuss any questions you have with your health care provider. Document Released: 12/16/2003 Document Revised: 04/14/2016 Document Reviewed: 09/30/2014 Elsevier Interactive Patient Education  2018 Elsevier Inc.  

## 2018-02-04 NOTE — Progress Notes (Signed)
Subjective:    Patient ID: Bonnie Tanner, female    DOB: [birth year], 77 y.o.   MRN: [id number]  HPI Bonnie Tanner is here today for follow up of chronic medical problem.  Outpatient Encounter Medications as of 02/04/2018  Medication Sig  . aspirin 325 MG tablet Take 325 mg by mouth daily.    Marland Kitchen atorvastatin (LIPITOR) 40 MG tablet Take 1 tablet (40 mg total) by mouth daily.  . cholecalciferol (VITAMIN D) 1000 UNITS tablet Take 1,000 Units by mouth daily.    Marland Kitchen levothyroxine (SYNTHROID, LEVOTHROID) 88 MCG tablet TAKE 1 TABLET BY MOUTH  DAILY BEFORE BREAKFAST  . lisinopril (PRINIVIL,ZESTRIL) 40 MG tablet TAKE 1 TABLET BY MOUTH  DAILY  . multivitamin-iron-minerals-folic acid (CENTRUM) chewable tablet Chew 1 tablet by mouth daily.    Marland Kitchen omeprazole (PRILOSEC) 40 MG capsule TAKE 1 CAPSULE BY MOUTH  DAILY  . verapamil (CALAN) 120 MG tablet TAKE 1 TABLET BY MOUTH  DAILY     1. Essential hypertension, benign  No c/o chest pain, sob or headache. Does not check blood pressure at home. BP Readings from Last 3 Encounters:  02/04/18 (!) 126/56  04/17/17 (!) 142/72  09/25/16 139/73     2. Coronary artery disease due to lipid rich plaque  Does not see cardiology- again denies no chest pain or SOB  3. Gastroesophageal reflux disease without esophagitis  Takes omeprazole daily which prevents reflux symptoms  4. Hyperlipidemia LDL goal <100  Does watch her diet daily  5. Malignant neoplasm of right breast in female, estrogen receptor positive, unspecified site  of breast [facility])  Has been finished with treatments since 2010 and she no longer sees oncology.  6. BMI 32.0-32.9,adult  No recent weight chnages  7. Acquired hypothyroidism  No problems that she is aware of.    New complaints: None today  Social history: Lives with husbands. 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  Review of Systems  Constitutional: Negative for activity change and  appetite change.  HENT: Negative.   Eyes: Negative for pain.  Respiratory: Negative for shortness of breath.   Cardiovascular: Negative for chest pain, palpitations and leg swelling.  Gastrointestinal: Negative for abdominal pain.  Endocrine: Negative for polydipsia.  Genitourinary: Negative.   Skin: Negative for rash.  Neurological: Negative for dizziness, weakness and headaches.  Hematological: Does not bruise/bleed easily.  Psychiatric/Behavioral: Negative.   All other systems reviewed and are negative.      Objective:   Physical Exam  Constitutional: She is oriented to person, place, and time.  HENT:  Head: Normocephalic.  Nose: Nose normal.  Mouth/Throat: Oropharynx is clear and moist.  Eyes: Pupils are equal, round, and reactive to light. EOM are normal.  Neck: Normal range of motion. Neck supple. No JVD present. Carotid bruit is not present.  Cardiovascular: Normal rate, regular rhythm, normal heart sounds and intact distal pulses.  Pulmonary/Chest: Effort normal and breath sounds normal. No respiratory distress. She has no wheezes. She has no rales. She exhibits no tenderness.  Abdominal: Soft. Normal appearance, normal aorta and bowel sounds are normal. She exhibits no distension, no abdominal bruit, no pulsatile midline mass and no mass. There is no splenomegaly or hepatomegaly. There is no tenderness.  Musculoskeletal: Normal range of motion. She exhibits no edema.  Lymphadenopathy:    She has no cervical adenopathy.  Neurological: She is alert and oriented to person, place, and time. She has normal reflexes.  Skin: Skin is warm and dry.  Psychiatric: Judgment normal.   BP (!) 126/56   Pulse (!) 59   Temp (!) 97.2 F (36.2 C) (Oral)   Ht _0  (1.499 m)   Wt 169 lb (76.7 kg)   BMI 34.13 kg/m   EKG- sinus bradycardia-[name], FNP  Chest x ray- no cardiopulmanary disease noted-Preliminary reading by Ronnald Collum, FNP  [facility]       Assessment &  Plan:  1. Essential hypertension, benign Low sodium diet - lisinopril (PRINIVIL,ZESTRIL) 40 MG tablet; Take 1 tablet (40 mg total) by mouth daily.  Dispense: 90 tablet; Refill: 1 - CMP14+EGFR  2. Coronary artery disease due to lipid rich plaque - EKG 12-Lead - DG Chest 2 View; Future - verapamil (CALAN) 120 MG tablet; Take 1 tablet (120 mg total) by mouth daily.  Dispense: 90 tablet; Refill: 1  3. Gastroesophageal reflux disease without esophagitis Avoid spicy foods Do not eat 2 hours prior to bedtime  - omeprazole (PRILOSEC) 40 MG capsule; Take 1 capsule (40 mg total) by mouth daily.  Dispense: 90 capsule; Refill: 1  4. Hyperlipidemia LDL goal <100 Low fat diet - atorvastatin (LIPITOR) 40 MG tablet; Take 1 tablet (40 mg total) by mouth daily.  Dispense: 90 tablet; Refill: 1 - Lipid panel  5. Malignant neoplasm of right breast in female, estrogen receptor positive, unspecified site of breast ([place]) Does elf breast exams  6. BMI 32.0-32.9,adult Discussed diet and exercise for person with BMI >25 Will recheck weight in 3-6 months  7. Acquired hypothyroidism - levothyroxine (SYNTHROID, LEVOTHROID) 88 MCG tablet; TAKE 1 TABLET BY MOUTH  DAILY BEFORE BREAKFAST  Dispense: 90 tablet; Refill: 1 - Thyroid Panel With TSH  8. Osteoporosis screening Weight bearing exercises encouraged - DG [facility] DEXA  9. Encounter for colorectal cancer screening - Cologuard    Labs pending Health maintenance reviewed Diet and exercise encouraged Continue all meds Follow up  In 6 months   [place], FNP

## 2018-02-05 DIAGNOSIS — Z780 Asymptomatic menopausal state: Secondary | ICD-10-CM | POA: Diagnosis not present

## 2018-02-05 DIAGNOSIS — M8589 Other specified disorders of bone density and structure, multiple sites: Secondary | ICD-10-CM | POA: Diagnosis not present

## 2018-02-05 LAB — CMP14+EGFR
A/G RATIO: 1.6 (ref 1.2–2.2)
ALBUMIN: 4.4 g/dL (ref 3.5–4.8)
ALK PHOS: 143 IU/L — AB (ref 39–117)
ALT: 30 IU/L (ref 0–32)
AST: 24 IU/L (ref 0–40)
BILIRUBIN TOTAL: 0.4 mg/dL (ref 0.0–1.2)
BUN / CREAT RATIO: 17 (ref 12–28)
BUN: 14 mg/dL (ref 8–27)
CO2: 25 mmol/L (ref 20–29)
Calcium: 10.7 mg/dL — ABNORMAL HIGH (ref 8.7–10.3)
Chloride: 103 mmol/L (ref 96–106)
Creatinine, Ser: 0.83 mg/dL (ref 0.57–1.00)
GFR calc Af Amer: 80 mL/min/{1.73_m2} (ref >59)
GFR calc non Af Amer: 70 mL/min/{1.73_m2} (ref >59)
Globulin, Total: 2.8 g/dL (ref 1.5–4.5)
Glucose: 93 mg/dL (ref 65–99)
POTASSIUM: 4.6 mmol/L (ref 3.5–5.2)
SODIUM: 141 mmol/L (ref 134–144)
Total Protein: 7.2 g/dL (ref 6.0–8.5)

## 2018-02-05 LAB — LIPID PANEL
CHOLESTEROL TOTAL: 164 mg/dL (ref 100–199)
Chol/HDL Ratio: 2.9 ratio (ref 0.0–4.4)
HDL: 57 mg/dL (ref >39)
LDL CALC: 80 mg/dL (ref 0–99)
Triglycerides: 135 mg/dL (ref 0–149)
VLDL Cholesterol Cal: 27 mg/dL (ref 5–40)

## 2018-02-05 LAB — THYROID PANEL WITH TSH
FREE THYROXINE INDEX: 2.6 (ref 1.2–4.9)
T3 UPTAKE RATIO: 25 % (ref 24–39)
T4 TOTAL: 10.2 ug/dL (ref 4.5–12.0)
TSH: 4.42 u[IU]/mL (ref 0.450–4.500)

## 2018-02-18 DIAGNOSIS — Z1211 Encounter for screening for malignant neoplasm of colon: Secondary | ICD-10-CM | POA: Diagnosis not present

## 2018-02-18 DIAGNOSIS — Z1212 Encounter for screening for malignant neoplasm of rectum: Secondary | ICD-10-CM | POA: Diagnosis not present

## 2018-03-01 LAB — COLOGUARD: COLOGUARD: NEGATIVE

## 2018-06-24 ENCOUNTER — Ambulatory Visit (INDEPENDENT_AMBULATORY_CARE_PROVIDER_SITE_OTHER): Payer: Medicare Other | Admitting: *Deleted

## 2018-06-24 ENCOUNTER — Encounter: Payer: Self-pay | Admitting: *Deleted

## 2018-06-24 VITALS — BP 106/57 | HR 58 | Ht 59.0 in | Wt 168.0 lb

## 2018-06-24 DIAGNOSIS — Z0000 Encounter for general adult medical examination without abnormal findings: Secondary | ICD-10-CM | POA: Diagnosis not present

## 2018-06-24 NOTE — Patient Instructions (Signed)
  Bonnie Tanner , Thank you for taking time to come for your Medicare Wellness Visit. I appreciate your ongoing commitment to your health goals. Please review the following plan we discussed and let me know if I can assist you in the future.   These are the goals we discussed: Goals    . Exercise 3x per week (30 min per time)     Walk for 30 minutes 3 times per week       This is a list of the screening recommended for you and due dates:  Health Maintenance  Topic Date Due  . Flu Shot  05/09/2018  . Mammogram  06/05/2019  . Tetanus Vaccine  07/10/2019  . DEXA scan (bone density measurement)  02/06/2020  . Cologuard (Stool DNA test)  02/18/2021  . Pneumonia vaccines  Completed

## 2018-06-24 NOTE — Progress Notes (Addendum)
Subjective:   Bonnie Tanner is a 77 y.o. female who presents for a Medicare Annual Wellness Visit. [name] lives at home with her husband of 48 years. 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 She does a lot of standing and walking while at work. She has been doing this for more than 30 years.     Review of Systems    Patient reports that her overall health is unchanged compared to last year.  Cardiac Risk Factors include: advanced age (>40men, >34 women);dyslipidemia;hypertension;obesity (BMI >30kg/m2)   All other systems negative     Current Medications (verified) Outpatient Encounter Medications as of 06/24/2018  Medication Sig  . aspirin 325 MG tablet Take 325 mg by mouth daily.    Marland Kitchen atorvastatin (LIPITOR) 40 MG tablet Take 1 tablet (40 mg total) by mouth daily.  . cholecalciferol (VITAMIN D) 1000 UNITS tablet Take 1,000 Units by mouth daily.    Marland Kitchen levothyroxine (SYNTHROID, LEVOTHROID) 88 MCG tablet TAKE 1 TABLET BY MOUTH  DAILY BEFORE BREAKFAST  . lisinopril (PRINIVIL,ZESTRIL) 40 MG tablet Take 1 tablet (40 mg total) by mouth daily.  . multivitamin-iron-minerals-folic acid (CENTRUM) chewable tablet Chew 1 tablet by mouth daily.    Marland Kitchen omeprazole (PRILOSEC) 40 MG capsule Take 1 capsule (40 mg total) by mouth daily.  . verapamil (CALAN) 120 MG tablet Take 1 tablet (120 mg total) by mouth daily.   No facility-administered encounter medications on file as of 06/24/2018.     Allergies (verified) Erythromycin; Latex; Nickel; Penicillins; and Sulfonamide derivatives   History: Past Medical History:  Diagnosis Date  . Arthritis   . CAD (coronary artery disease)    Non obstructive  . Cancer [facility]) 2011   breast/radiation Tx/Lumpectomy  . GERD (gastroesophageal reflux disease)   . Heart murmur   . Hyperlipidemia   . Hypertension   . Hypothyroidism    Past Surgical History:  Procedure Laterality Date  . BREAST LUMPECTOMY  04/19/2010   radiation therapy right breast  . CARDIAC CATHETERIZATION  09/2009  . CATARACT EXTRACTION  2006   bilateral  . EYE SURGERY  2000   made her ducts   Family History  Problem Relation Age of Onset  . Heart disease Mother 25  . Emphysema Father   . Heart disease Father   . Throat cancer Brother   . Stroke Brother   . Healthy Son   . Prostate cancer Paternal Uncle   . Stomach cancer Paternal Uncle    Social History   Socioeconomic History  . Marital status: Married    Spouse name: Not on file  . Number of children: 1  . Years of education: 24  . Highest education level: Some college, no degree  Occupational History  . Occupation: Insurance claims handler  Social Needs  . Financial resource strain: Not hard at all  . Food insecurity:    Worry: Never true    Inability: Never true  . Transportation needs:    Medical: No    Non-medical: Not on file  Tobacco Use  . Smoking status: Never Smoker  . Smokeless tobacco: Never Used  Substance and Sexual Activity  . Alcohol use: No  . Drug use: No  . Sexual activity: Yes  Lifestyle  . Physical activity:    Days per week: 5 days    Minutes per session: 60 min  . Stress: Not at all  Relationships  . Social connections:    Talks on phone:  More than three times a week    Gets together: More than three times a week    Attends religious service: More than 4 times per year    Active member of club or organization: Yes    Attends meetings of clubs or organizations: More than 4 times per year    Relationship status: Married  Other Topics Concern  . Not on file  Social History Narrative   Lives in [place] with husband    Tobacco Use No.  Clinical Intake:                        Activities of Daily Living In your present state of health, do you have any difficulty performing the following activities: 06/24/2018 06/24/2018  Hearing? Y N  Comment some decreased hearing in left ear -  Vision? N -  Comment recent eye exam -    Difficulty concentrating or making decisions? N -  Walking or climbing stairs? N -  Dressing or bathing? N -  Doing errands, shopping? N -  Preparing Food and eating ? N -  Using the Toilet? N -  In the past six months, have you accidently leaked urine? N -  Do you have problems with loss of bowel control? N -  Managing your Medications? N -  Managing your Finances? N -  Housekeeping or managing your Housekeeping? N -  Some recent data might be hidden    Diet 2 meals and snack at lunch Trying to drink more water  Exercise Current Exercise Habits: Home exercise routine(walks a lot at work and stands most of the day ), Type of exercise: walking, Time (Minutes): 30, Frequency (Times/Week): 7, Weekly Exercise (Minutes/Week): 210, Intensity: Mild, Exercise limited by: None identified   Depression Screen PHQ 2/9 Scores 06/24/2018 02/04/2018 04/17/2017 09/25/2016 09/05/2016 05/15/2016 04/17/2016  PHQ - 2 Score 0 0 0 0 0 0 0     Fall Risk Fall Risk  06/24/2018 02/04/2018 04/17/2017 09/25/2016 09/05/2016  Falls in the past year? No No Yes No No  Number falls in past yr: - - 1 - -  Injury with Fall? - - No - -    Safety Is the patient's home free of loose throw rugs in walkways, pet beds, electrical cords, etc?   yes      Grab bars in the bathroom? yes      Walkin shower? no      Shower Seat? no      Handrails on the stairs?   yes      Adequate lighting?   yes  Patient Care Team: Chevis Pretty, FNP as PCP - General (Nurse Practitioner) Hilarie Fredrickson Lajuan Lines, MD as Consulting Physician (Gastroenterology)  Hospitalizations, surgeries, and ER visits in previous 12 months No hospitalizations, ER visits, or surgeries this past year.   Objective:    Today's Vitals   06/24/18 0827  BP: (!) 106/57  Pulse: (!) 58  Weight: 168 lb (76.2 kg)  Height: 4\' 11"  (1.499 m)   Body mass index is 33.93 kg/m.  Advanced Directives 02/02/2015  Does Patient Have a Medical Advance Directive? No   Would patient like information on creating a medical advance directive? Yes - Educational materials given    Hearing/Vision  No hearing or vision deficits noted during visit.  Cognitive Function: MMSE - Mini Mental State Exam 06/24/2018 02/02/2015  Not completed: - Refused  Orientation to time 5 5  Orientation to Place 5 5  Registration 3 3  Attention/ Calculation 5 5  Recall 3 3  Language- name 2 objects 2 2  Language- repeat 1 1  Language- follow 3 step command 3 3  Language- read & follow direction 1 1  Write a sentence 1 1  Copy design 1 1  Total score 30 30       Normal Cognitive Function Screening: Yes    Immunizations and Health Maintenance Immunization History  Administered Date(s) Administered  . Influenza Whole 07/09/2010  . Influenza, High Dose Seasonal PF 09/05/2016, 08/06/2017  . Influenza,inj,Quad PF,6+ Mos 10/06/2013, 08/29/2014, 08/23/2015  . Pneumococcal Conjugate-13 02/02/2015  . Pneumococcal Polysaccharide-23 10/21/2012  . Td 07/09/2009   Health Maintenance Due  Topic Date Due  . INFLUENZA VACCINE  05/09/2018   Health Maintenance  Topic Date Due  . INFLUENZA VACCINE  05/09/2018  . MAMMOGRAM  06/05/2019  . TETANUS/TDAP  07/10/2019  . DEXA SCAN  02/06/2020  . Fecal DNA (Cologuard)  02/18/2021  . PNA vac Low Risk Adult  Completed        Assessment:   This is a routine wellness examination for [place].    Plan:    Goals    . Exercise 3x per week (30 min per time)     Walk for 30 minutes 3 times per week        Health Maintenance Recommendations: Influenza vaccine  mammogram  Additional Screening Recommendations: Lung: Low Dose CT Chest recommended if Age 71-80 years, 30 pack-year currently smoking OR have quit w/in 15years. Patient does not qualify. Hepatitis C Screening recommended: no  Today's Orders Orders Placed This Encounter  Procedures  . HM MAMMOGRAPHY    This external order was created through the Results Console.     Keep f/u with Chevis Pretty, FNP and any other specialty appointments you may have Continue current medications Move carefully to avoid falls. Use assistive devices like a cane or walker if needed. Aim for at least 150 minutes of moderate activity a week. This can be done with chair exercises if necessary. Read or work on puzzles daily Stay connected with friends and family  I have personally reviewed and noted the following in the patient's chart:   . Medical and social history . Use of alcohol, tobacco or illicit drugs  . Current medications and supplements . Functional ability and status . Nutritional status . Physical activity . Advanced directives . List of other physicians . Hospitalizations, surgeries, and ER visits in previous 12 months . Vitals . Screenings to include cognitive, depression, and falls . Referrals and appointments  In addition, I have reviewed and discussed with patient certain preventive protocols, quality metrics, and best practice recommendations. A written personalized care plan for preventive services as well as general preventive health recommendations were provided to patient.     Chong Sicilian, RN   06/24/2018   I have reviewed and agree with the above AWV documentation.   [name], FNP

## 2018-07-02 ENCOUNTER — Other Ambulatory Visit: Payer: Self-pay | Admitting: Nurse Practitioner

## 2018-07-02 DIAGNOSIS — I2510 Atherosclerotic heart disease of native coronary artery without angina pectoris: Secondary | ICD-10-CM

## 2018-07-02 DIAGNOSIS — E039 Hypothyroidism, unspecified: Secondary | ICD-10-CM

## 2018-07-02 DIAGNOSIS — K219 Gastro-esophageal reflux disease without esophagitis: Secondary | ICD-10-CM

## 2018-07-02 DIAGNOSIS — I10 Essential (primary) hypertension: Secondary | ICD-10-CM

## 2018-07-02 DIAGNOSIS — I2583 Coronary atherosclerosis due to lipid rich plaque: Secondary | ICD-10-CM

## 2018-07-04 ENCOUNTER — Other Ambulatory Visit: Payer: Self-pay | Admitting: *Deleted

## 2018-07-04 DIAGNOSIS — I2583 Coronary atherosclerosis due to lipid rich plaque: Secondary | ICD-10-CM

## 2018-07-04 DIAGNOSIS — I10 Essential (primary) hypertension: Secondary | ICD-10-CM

## 2018-07-04 DIAGNOSIS — I2510 Atherosclerotic heart disease of native coronary artery without angina pectoris: Secondary | ICD-10-CM

## 2018-07-04 NOTE — Telephone Encounter (Signed)
Erroneous encounter

## 2018-07-19 ENCOUNTER — Telehealth: Payer: Self-pay | Admitting: Nurse Practitioner

## 2018-07-19 NOTE — Telephone Encounter (Signed)
LMOVM to see if she had received her medications that were sent to [facility] on 07/02/18

## 2018-07-24 NOTE — Telephone Encounter (Signed)
Closing encounter, patient never returned call

## 2018-08-19 ENCOUNTER — Encounter: Payer: Self-pay | Admitting: Nurse Practitioner

## 2018-08-19 ENCOUNTER — Ambulatory Visit (INDEPENDENT_AMBULATORY_CARE_PROVIDER_SITE_OTHER): Payer: Medicare Other | Admitting: Nurse Practitioner

## 2018-08-19 VITALS — BP 145/71 | HR 52 | Temp 98.1°F | Ht 59.0 in | Wt 169.0 lb

## 2018-08-19 DIAGNOSIS — Z23 Encounter for immunization: Secondary | ICD-10-CM

## 2018-08-19 DIAGNOSIS — I10 Essential (primary) hypertension: Secondary | ICD-10-CM | POA: Diagnosis not present

## 2018-08-19 DIAGNOSIS — I2583 Coronary atherosclerosis due to lipid rich plaque: Secondary | ICD-10-CM

## 2018-08-19 DIAGNOSIS — Z6832 Body mass index (BMI) 32.0-32.9, adult: Secondary | ICD-10-CM

## 2018-08-19 DIAGNOSIS — I2510 Atherosclerotic heart disease of native coronary artery without angina pectoris: Secondary | ICD-10-CM

## 2018-08-19 DIAGNOSIS — E039 Hypothyroidism, unspecified: Secondary | ICD-10-CM | POA: Diagnosis not present

## 2018-08-19 DIAGNOSIS — Z170 Estrogen receptor positive status [ER+]: Secondary | ICD-10-CM

## 2018-08-19 DIAGNOSIS — K219 Gastro-esophageal reflux disease without esophagitis: Secondary | ICD-10-CM

## 2018-08-19 DIAGNOSIS — C50911 Malignant neoplasm of unspecified site of right female breast: Secondary | ICD-10-CM

## 2018-08-19 DIAGNOSIS — E785 Hyperlipidemia, unspecified: Secondary | ICD-10-CM

## 2018-08-19 MED ORDER — LISINOPRIL 40 MG PO TABS: 40.0000 mg | ORAL_TABLET | Freq: Every day | ORAL | 1 refills | Status: DC

## 2018-08-19 MED ORDER — ATORVASTATIN CALCIUM 40 MG PO TABS: 40.0000 mg | ORAL_TABLET | Freq: Every day | ORAL | 1 refills | Status: DC

## 2018-08-19 MED ORDER — OMEPRAZOLE 40 MG PO CPDR: 40.0000 mg | DELAYED_RELEASE_CAPSULE | Freq: Every day | ORAL | 1 refills | Status: DC

## 2018-08-19 MED ORDER — LEVOTHYROXINE SODIUM 88 MCG PO TABS: 88.0000 ug | ORAL_TABLET | Freq: Every day | ORAL | 1 refills | Status: DC

## 2018-08-19 MED ORDER — VERAPAMIL HCL 120 MG PO TABS: 120.0000 mg | ORAL_TABLET | Freq: Every day | ORAL | 1 refills | Status: DC

## 2018-08-19 NOTE — Progress Notes (Signed)
Subjective:    Patient ID: Bonnie Tanner, female    DOB: 1941-01-02, 77 y.o.   MRN: [id number]   Chief Complaint: medical management of chronic issues  HPI:  1. Essential hypertension, benign  No c/o chest pain, sob or headache. Doe snot check blood pressure at home. BP Readings from Last 3 Encounters:  06/24/18 (!) 106/57  02/04/18 (!) 126/56  04/17/17 (!) 142/72     2. Coronary artery disease due to lipid rich plaque  She has not seen cardiology in many years. Says she feels fine and see no need to go. She does take a daily aspirin.  3. Gastroesophageal reflux disease without esophagitis  takes omeprazole daily which works well to keep symptoms under control.  4. Acquired hypothyroidism  No problems that se is aware of.  5. Hyperlipidemia LDL goal <100  Tries to watch diet. Does very little exercise  6. Malignant neoplasm of right breast in female, estrogen receptor positive, unspecified site of breast [facility]) this was in 2012 ab=nd she has been doing well since then. Has yearly mammgrams.  7. BMI 32.0-32.9,adult  No recent weight chnages    Outpatient Encounter Medications as of 08/19/2018  Medication Sig  . aspirin 325 MG tablet Take 325 mg by mouth daily.    Marland Kitchen atorvastatin (LIPITOR) 40 MG tablet Take 1 tablet (40 mg total) by mouth daily.  . cholecalciferol (VITAMIN D) 1000 UNITS tablet Take 1,000 Units by mouth daily.    Marland Kitchen levothyroxine (SYNTHROID, LEVOTHROID) 88 MCG tablet TAKE 1 TABLET BY MOUTH  DAILY BEFORE BREAKFAST  . lisinopril (PRINIVIL,ZESTRIL) 40 MG tablet Take 1 tablet (40 mg total) by mouth daily. (Needs to be seen before next refill)  . multivitamin-iron-minerals-folic acid (CENTRUM) chewable tablet Chew 1 tablet by mouth daily.    Marland Kitchen omeprazole (PRILOSEC) 40 MG capsule Take 1 capsule (40 mg total) by mouth daily. (Needs to be seen before next refill)  . verapamil (CALAN) 120 MG tablet Take 1 tablet (120 mg total) by mouth daily. 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   Review of Systems  Constitutional: Negative for activity change and appetite change.  HENT: Negative.   Eyes: Negative for pain.  Respiratory: Negative for shortness of breath.   Cardiovascular: Negative for chest pain, palpitations and leg swelling.  Gastrointestinal: Negative for abdominal pain.  Endocrine: Negative for polydipsia.  Genitourinary: Negative.   Skin: Negative for rash.  Neurological: Negative for dizziness, weakness and headaches.  Hematological: Does not bruise/bleed easily.  Psychiatric/Behavioral: Negative.   All other systems reviewed and are negative.      Objective:   Physical Exam  Constitutional: She is oriented to person, place, and time. She appears well-developed and well-nourished. No distress.  HENT:  Head: Normocephalic.  Nose: Nose normal.  Mouth/Throat: Oropharynx is clear and moist.  Eyes: Pupils are equal, round, and reactive to light. EOM are normal.  Neck: Normal range of motion. Neck supple. No JVD present. Carotid bruit is not present.  Cardiovascular: Normal rate, regular rhythm, normal heart sounds and intact distal pulses.  Pulmonary/Chest: Effort normal and breath sounds normal. No respiratory distress. She has no wheezes. She has no rales. She exhibits no tenderness.  Abdominal: Soft. Normal appearance, normal aorta and bowel sounds are normal. She exhibits no distension, no abdominal bruit, no pulsatile midline mass and no mass.  There is no splenomegaly or hepatomegaly. There is no tenderness.  Musculoskeletal: Normal range of motion. She exhibits no edema.  Lymphadenopathy:    She has no cervical adenopathy.  Neurological: She is alert and oriented to person, place, and time. She has normal reflexes.  Skin: Skin is warm and dry.  Psychiatric: She has a normal mood and affect. Her behavior is normal. Judgment  and thought content normal.  Nursing note and vitals reviewed.  BP (!) 145/71   Pulse (!) 52   Temp 98.1 F (36.7 C) (Oral)   Ht 4\' 11"  (1.499 m)   Wt 169 lb (76.7 kg)   BMI 34.13 kg/m       Assessment & Plan:  Bonnie Tanner comes in today with chief complaint of Medical Management of Chronic Issues   Diagnosis and orders addressed:  1. Essential hypertension, benign Low sodium diet - lisinopril (PRINIVIL,ZESTRIL) 40 MG tablet; Take 1 tablet (40 mg total) by mouth daily. (Needs to be seen before next refill)  Dispense: 90 tablet; Refill: 1  2. Coronary artery disease due to lipid rich plaque Low fat diet - verapamil (CALAN) 120 MG tablet; Take 1 tablet (120 mg total) by mouth daily. (Needs to be seen before next refill)  Dispense: 90 tablet; Refill: 1  3. Gastroesophageal reflux disease without esophagitis Avoid spicy foods Do not eat 2 hours prior to bedtime - omeprazole (PRILOSEC) 40 MG capsule; Take 1 capsule (40 mg total) by mouth daily. (Needs to be seen before next refill)  Dispense: 90 capsule; Refill: 1  4. Acquired hypothyroidism - levothyroxine (SYNTHROID, LEVOTHROID) 88 MCG tablet; Take 1 tablet (88 mcg total) by mouth daily before breakfast.  Dispense: 90 tablet; Refill: 1  5. Hyperlipidemia LDL goal <100 Low fat diet - atorvastatin (LIPITOR) 40 MG tablet; Take 1 tablet (40 mg total) by mouth daily.  Dispense: 90 tablet; Refill: 1  6. Malignant neoplasm of right breast in female, estrogen receptor positive, unspecified site of breast ([place]) Has yearly mammograms  7. BMI 32.0-32.9,adult Discussed diet and exercise for person with BMI >25 Will recheck weight in 3-6 months   Labs pending Health Maintenance reviewed Diet and exercise encouraged  Follow up plan: 6 months   [name], FNP

## 2018-08-19 NOTE — Patient Instructions (Signed)

## 2018-08-20 DIAGNOSIS — E039 Hypothyroidism, unspecified: Secondary | ICD-10-CM | POA: Diagnosis not present

## 2018-08-20 DIAGNOSIS — Z23 Encounter for immunization: Secondary | ICD-10-CM | POA: Diagnosis not present

## 2018-08-20 DIAGNOSIS — I10 Essential (primary) hypertension: Secondary | ICD-10-CM | POA: Diagnosis not present

## 2018-09-10 ENCOUNTER — Telehealth: Payer: Self-pay

## 2018-09-10 ENCOUNTER — Other Ambulatory Visit: Payer: Self-pay | Admitting: Nurse Practitioner

## 2018-09-10 DIAGNOSIS — Z1231 Encounter for screening mammogram for malignant neoplasm of breast: Secondary | ICD-10-CM

## 2018-09-10 NOTE — Addendum Note (Signed)
Addended by: Chevis Pretty on: 09/10/2018 01:14 PM   Modules accepted: Orders

## 2018-09-10 NOTE — Telephone Encounter (Signed)
Needs an order faxed for Diagnostic Bilateral Mammogram [place] if needed to [phone]

## 2018-09-23 DIAGNOSIS — Z853 Personal history of malignant neoplasm of breast: Secondary | ICD-10-CM | POA: Diagnosis not present

## 2018-09-23 DIAGNOSIS — D0511 Intraductal carcinoma in situ of right breast: Secondary | ICD-10-CM | POA: Diagnosis not present

## 2018-09-23 LAB — HM MAMMOGRAPHY

## 2018-11-18 ENCOUNTER — Encounter: Payer: Self-pay | Admitting: Nurse Practitioner

## 2018-11-18 ENCOUNTER — Ambulatory Visit (INDEPENDENT_AMBULATORY_CARE_PROVIDER_SITE_OTHER): Payer: Medicare Other | Admitting: Nurse Practitioner

## 2018-11-18 VITALS — BP 160/81 | HR 61 | Temp 96.8°F | Ht 59.0 in | Wt 170.0 lb

## 2018-11-18 DIAGNOSIS — S0083XA Contusion of other part of head, initial encounter: Secondary | ICD-10-CM

## 2018-11-18 DIAGNOSIS — S6991XA Unspecified injury of right wrist, hand and finger(s), initial encounter: Secondary | ICD-10-CM | POA: Diagnosis not present

## 2018-11-18 NOTE — Patient Instructions (Signed)
Eye Contusion An eye contusion is a deep bruise of the eye or the area around the eye. This is often called a "black eye." A black eye can happen when an injury causes bleeding under the skin. The skin over the bruise may turn blue, purple, green, or yellow. Minor injuries may be painless. Bruises that are very bad may be painful and swollen for a few weeks. A black eye can affect your eyeball and your eyesight. What are the causes?  A hard hit or direct force to your face, nose, or eye.  A head injury that causes the blood under your skin to flow toward your eyelids.  Surgery on your face, such as a face-lift or nose surgery.  Dental work. This includes wisdom tooth removal or dental implant surgery. What are the signs or symptoms?   Pain and swelling around your eye.  A change in the normal color (discoloration) around your eye. ? The area may start out red and then turn blue, purple, green, or yellow.  Blurry vision.  Tearing.  Eyeball redness. How is this treated? A black eye usually heals on its own in a few days or weeks. If needed, this condition may be treated by:  Icing your eye and taking pain medicine.  Surgery. This may be needed if you have broken bones or an injury to the eyeball. Follow these instructions at home: Managing pain, stiffness, and swelling   If told, put ice on the injured area: ? Put ice in a plastic bag. ? Place a towel between your skin and the bag. ? Leave the ice on for 20 minutes, 2-3 times a day. General instructions  Sleep with your head raised (elevated). You may do this by putting an extra pillow under your head.  Return to your normal activities as told by your doctor. Ask your doctor what activities are safe for you.  Take over-the-counter and prescription medicines only as told by your doctor.  Keep all follow-up visits as told by your doctor. This is important. Contact a doctor if:  Your symptoms do not get better after  several days.  Medicine does not help your swelling or pain. Get help right away if:  You lose your sight.  You are seeing double.  Your eye suddenly turns red.  The black part of your eye (pupil) is an odd shape or size.  You have very bad pain.  You have a very bad headache.  You feel sick to your stomach (nauseous).  You throw up (vomit).  You feel dizzy or sleepy, or you feel like you will pass out (faint).  You pass out.  You have a lot of clear fluid or blood coming from your eye or nose. Summary  A black eye can happen when an injury causes bleeding under the skin.  The skin around the eye may look blue, purple, green, or yellow.  A black eye often heals on its own in a few days or weeks. If needed, it may be treated with ice and pain medicines.  Surgery may be needed if you have broken bones or an injury to the eyeball. This information is not intended to replace advice given to you by your health care provider. Make sure you discuss any questions you have with your health care provider. Document Released: 09/14/2011 Document Revised: 04/23/2018 Document Reviewed: 04/23/2018 Elsevier Interactive Patient Education  [employer].

## 2018-11-18 NOTE — Progress Notes (Signed)
   Subjective:    Patient ID: Bonnie Tanner, female    DOB: 12-18-1940, 78 y.o.   MRN: [id number]  Chief Complaint: Lytle Michaels Golden Circle yesterday at [facility] and hit her eye)   HPI Patient was walking and not looking where she was going and stepped off curve and fell. Face hit cement and right hand swollen. It did not knock her out. She denies headache or blurred vision   Review of Systems  Constitutional: Negative.   HENT: Negative.   Respiratory: Negative.   Cardiovascular: Negative.   Genitourinary: Negative.   Skin:       Contusion to face.  Neurological: Negative for dizziness, speech difficulty and headaches.  Psychiatric/Behavioral: Negative.   All other systems reviewed and are negative.      Objective:   Physical Exam Vitals signs and nursing note reviewed.  Constitutional:      General: She is not in acute distress.    Appearance: She is obese.  Neck:     Musculoskeletal: Normal range of motion.  Cardiovascular:     Rate and Rhythm: Normal rate and regular rhythm.     Heart sounds: Normal heart sounds.  Pulmonary:     Breath sounds: Normal breath sounds.  Musculoskeletal:     Comments: Right hand contusion with dorsal edema. FROM without pain of hand and wrist.  Skin:    General: Skin is warm.     Comments: Right eye facial contusion.  Neurological:     General: No focal deficit present.     Mental Status: She is alert and oriented to person, place, and time.  Psychiatric:        Mood and Affect: Mood normal.        Behavior: Behavior normal.       BP (!) 160/81   Pulse 61   Temp (!) 96.8 F (36 C) (Oral)   Ht 4\' 11"  (1.499 m)   Wt 170 lb (77.1 kg)   BMI 34.34 kg/m         Assessment & Plan:  [name] in today with chief complaint of Fall Golden Circle yesterday at [employer] and hit her eye)   1. Contusion of face, initial encounter Ice bid Do not rub or push on face  2. Hand injury, right, initial encounter Ice bid elevtae when  sitting RTO if worsening- do not thinK it is broken- have no exray today- RTO if no better and we will xray. Tylenol OTC for pain  [name], FNP

## 2019-02-24 ENCOUNTER — Ambulatory Visit: Payer: Medicare Other | Admitting: Nurse Practitioner

## 2019-02-25 ENCOUNTER — Other Ambulatory Visit: Payer: Self-pay

## 2019-02-25 ENCOUNTER — Ambulatory Visit (INDEPENDENT_AMBULATORY_CARE_PROVIDER_SITE_OTHER): Payer: Medicare Other | Admitting: Nurse Practitioner

## 2019-02-25 ENCOUNTER — Encounter: Payer: Self-pay | Admitting: Nurse Practitioner

## 2019-02-25 DIAGNOSIS — E039 Hypothyroidism, unspecified: Secondary | ICD-10-CM | POA: Diagnosis not present

## 2019-02-25 DIAGNOSIS — I2583 Coronary atherosclerosis due to lipid rich plaque: Secondary | ICD-10-CM

## 2019-02-25 DIAGNOSIS — I10 Essential (primary) hypertension: Secondary | ICD-10-CM | POA: Diagnosis not present

## 2019-02-25 DIAGNOSIS — Z6832 Body mass index (BMI) 32.0-32.9, adult: Secondary | ICD-10-CM

## 2019-02-25 DIAGNOSIS — K219 Gastro-esophageal reflux disease without esophagitis: Secondary | ICD-10-CM

## 2019-02-25 DIAGNOSIS — I2510 Atherosclerotic heart disease of native coronary artery without angina pectoris: Secondary | ICD-10-CM

## 2019-02-25 DIAGNOSIS — E785 Hyperlipidemia, unspecified: Secondary | ICD-10-CM | POA: Diagnosis not present

## 2019-02-25 MED ORDER — ATORVASTATIN CALCIUM 40 MG PO TABS: 40.0000 mg | ORAL_TABLET | Freq: Every day | ORAL | 1 refills | Status: DC

## 2019-02-25 MED ORDER — VERAPAMIL HCL 120 MG PO TABS: 120.0000 mg | ORAL_TABLET | Freq: Every day | ORAL | 1 refills | Status: DC

## 2019-02-25 MED ORDER — LEVOTHYROXINE SODIUM 88 MCG PO TABS: 88.0000 ug | ORAL_TABLET | Freq: Every day | ORAL | 1 refills | Status: DC

## 2019-02-25 MED ORDER — LISINOPRIL 40 MG PO TABS: 40.0000 mg | ORAL_TABLET | Freq: Every day | ORAL | 1 refills | Status: DC

## 2019-02-25 MED ORDER — OMEPRAZOLE 40 MG PO CPDR: 40.0000 mg | DELAYED_RELEASE_CAPSULE | Freq: Every day | ORAL | 1 refills | Status: DC

## 2019-02-25 NOTE — Progress Notes (Signed)
Virtual Visit via telephone Note  I connected with Bonnie Tanner on 02/25/19 at 9:00 AM by telephone and verified that I am speaking with the correct person using two identifiers. Bonnie Tanner is currently located at home and no one is currently with her during visit. The provider, [name], FNP is located in their office at time of visit.  I discussed the limitations, risks, security and privacy concerns of performing an evaluation and management service by telephone and the availability of in person appointments. I also discussed with the patient that there may be a patient responsible charge related to this service. The patient expressed understanding and agreed to proceed.   History and Present Illness:   Chief Complaint: Medical Management of Chronic Issues    HPI:  1. Essential hypertension, benign No c/o chest pain, sob or headache. Does not check blood pressure at home. BP Readings from Last 3 Encounters:  11/18/18 (!) 160/81  08/19/18 (!) 145/71  06/24/18 (!) 106/57     2. Hyperlipidemia LDL goal <100 Not watching diet and doing very little exercise.  3. Coronary artery disease due to lipid rich plaque Has not seen cardiologyy in several years. Is doing well and does not feel she needs to see one right now.  4. Acquired hypothyroidism No problems that aware of  5. Gastroesophageal reflux disease without esophagitis Is on omeprazole daily and is doing well. No recent symptoms nited  6. BMI 32.0-32.9,adult No recent weight changes    Outpatient Encounter Medications as of 02/25/2019  Medication Sig  . aspirin 325 MG tablet Take 325 mg by mouth daily.    Marland Kitchen atorvastatin (LIPITOR) 40 MG tablet Take 1 tablet (40 mg total) by mouth daily.  . cholecalciferol (VITAMIN D) 1000 UNITS tablet Take 1,000 Units by mouth daily.    Marland Kitchen levothyroxine (SYNTHROID, LEVOTHROID) 88 MCG tablet Take 1 tablet (88 mcg total) by mouth daily before breakfast.  . lisinopril  (PRINIVIL,ZESTRIL) 40 MG tablet Take 1 tablet (40 mg total) by mouth daily. (Needs to be seen before next refill)  . multivitamin-iron-minerals-folic acid (CENTRUM) chewable tablet Chew 1 tablet by mouth daily.    Marland Kitchen omeprazole (PRILOSEC) 40 MG capsule Take 1 capsule (40 mg total) by mouth daily. (Needs to be seen before next refill)  . verapamil (CALAN) 120 MG tablet Take 1 tablet (120 mg total) by mouth daily. (Needs to be seen before next refill)     New complaints: None today  Social history: Lives with husband     Review of Systems  Constitutional: Negative for diaphoresis and weight loss.  Eyes: Negative for blurred vision, double vision and pain.  Respiratory: Negative for shortness of breath.   Cardiovascular: Negative for chest pain, palpitations, orthopnea and leg swelling.  Gastrointestinal: Negative for abdominal pain.  Skin: Negative for rash.  Neurological: Negative for dizziness, sensory change, loss of consciousness, weakness and headaches.  Endo/Heme/Allergies: Negative for polydipsia. Does not bruise/bleed easily.  Psychiatric/Behavioral: Negative for memory loss. The patient does not have insomnia.   All other systems reviewed and are negative.    Observations/Objective: Alert and oriented- answers all questions appropriately No distress  Assessment and Plan: Bonnie Tanner comes in today with chief complaint of Medical Management of Chronic Issues   Diagnosis and orders addressed:  1. Essential hypertension, benign Low sodium diet - lisinopril (ZESTRIL) 40 MG tablet; Take 1 tablet (40 mg total) by mouth daily. (Needs to be seen before next refill)  Dispense:  90 tablet; Refill: 1  2. Hyperlipidemia LDL goal <100 Low fat diet - atorvastatin (LIPITOR) 40 MG tablet; Take 1 tablet (40 mg total) by mouth daily.  Dispense: 90 tablet; Refill: 1  3. Coronary artery disease due to lipid rich plaque If develop chest pain reprot immediately - verapamil (CALAN)  120 MG tablet; Take 1 tablet (120 mg total) by mouth daily. (Needs to be seen before next refill)  Dispense: 90 tablet; Refill: 1  4. Acquired hypothyroidism - levothyroxine (SYNTHROID) 88 MCG tablet; Take 1 tablet (88 mcg total) by mouth daily before breakfast.  Dispense: 90 tablet; Refill: 1  5. Gastroesophageal reflux disease without esophagitis Avoid spicy foods Do not eat 2 hours prior to bedtime - omeprazole (PRILOSEC) 40 MG capsule; Take 1 capsule (40 mg total) by mouth daily. (Needs to be seen before next refill)  Dispense: 90 capsule; Refill: 1  6. BMI 32.0-32.9,adult Discussed diet and exercise for person with BMI >25 Will recheck weight in 3-6 months    Labs pending Health Maintenance reviewed Diet and exercise encouraged   Follow Up Instructions: 3 months    I discussed the assessment and treatment plan with the patient. The patient was provided an opportunity to ask questions and all were answered. The patient agreed with the plan and demonstrated an understanding of the instructions.   The patient was advised to call back or seek an in-person evaluation if the symptoms worsen or if the condition fails to improve as anticipated.  The above assessment and management plan was discussed with the patient. The patient verbalized understanding of and has agreed to the management plan. Patient is aware to call the clinic if symptoms persist or worsen. Patient is aware when to return to the clinic for a follow-up visit. Patient educated on when it is appropriate to go to the emergency department.   Time call ended:  9:20  I provided 20 minutes minutes of non-face-to-face time during this encounter.    [name], FNP

## 2019-05-16 ENCOUNTER — Other Ambulatory Visit: Payer: Self-pay

## 2019-05-16 NOTE — Patient Outreach (Signed)
[facility]) Care Management  05/16/2019  Bonnie Tanner March 28, 1941 [id number]  Medication Adherence call to Bonnie Tanner [place] Identifiers Verify spoke with patient she is past due on Lisinopril 4 mg and  Atorvastatin 40 mg patient explain she is only taking Atorvastatin every other day on Lisinopril patient ask if we can call [facility] an order this medication along others there were due, [facility] will mail it with in 5-7 days. Bonnie Tanner is showing past due under [place].  [place] Management Direct Dial [phone]  Fax [phone] [email or web address]

## 2019-06-07 DIAGNOSIS — H5203 Hypermetropia, bilateral: Secondary | ICD-10-CM | POA: Diagnosis not present

## 2019-06-09 ENCOUNTER — Encounter: Payer: Self-pay | Admitting: Nurse Practitioner

## 2019-06-09 ENCOUNTER — Ambulatory Visit (INDEPENDENT_AMBULATORY_CARE_PROVIDER_SITE_OTHER): Payer: Medicare Other | Admitting: Nurse Practitioner

## 2019-06-09 ENCOUNTER — Other Ambulatory Visit: Payer: Self-pay

## 2019-06-09 VITALS — BP 129/60 | HR 55 | Temp 98.0°F | Ht 59.0 in | Wt 173.0 lb

## 2019-06-09 DIAGNOSIS — I2510 Atherosclerotic heart disease of native coronary artery without angina pectoris: Secondary | ICD-10-CM | POA: Diagnosis not present

## 2019-06-09 DIAGNOSIS — E785 Hyperlipidemia, unspecified: Secondary | ICD-10-CM | POA: Diagnosis not present

## 2019-06-09 DIAGNOSIS — E039 Hypothyroidism, unspecified: Secondary | ICD-10-CM

## 2019-06-09 DIAGNOSIS — I2583 Coronary atherosclerosis due to lipid rich plaque: Secondary | ICD-10-CM

## 2019-06-09 DIAGNOSIS — K219 Gastro-esophageal reflux disease without esophagitis: Secondary | ICD-10-CM

## 2019-06-09 DIAGNOSIS — I10 Essential (primary) hypertension: Secondary | ICD-10-CM | POA: Diagnosis not present

## 2019-06-09 DIAGNOSIS — Z6832 Body mass index (BMI) 32.0-32.9, adult: Secondary | ICD-10-CM

## 2019-06-09 LAB — CMP14+EGFR
ALT: 22 IU/L (ref 0–32)
AST: 19 IU/L (ref 0–40)
Albumin/Globulin Ratio: 2 (ref 1.2–2.2)
Albumin: 4.4 g/dL (ref 3.7–4.7)
Alkaline Phosphatase: 133 IU/L — ABNORMAL HIGH (ref 39–117)
BUN/Creatinine Ratio: 18 (ref 12–28)
BUN: 18 mg/dL (ref 8–27)
Bilirubin Total: 0.4 mg/dL (ref 0.0–1.2)
CO2: 24 mmol/L (ref 20–29)
Calcium: 10.5 mg/dL — ABNORMAL HIGH (ref 8.7–10.3)
Chloride: 102 mmol/L (ref 96–106)
Creatinine, Ser: 1.01 mg/dL — ABNORMAL HIGH (ref 0.57–1.00)
GFR calc Af Amer: 63 mL/min/{1.73_m2} (ref >59)
GFR calc non Af Amer: 55 mL/min/{1.73_m2} — ABNORMAL LOW (ref >59)
Globulin, Total: 2.2 g/dL (ref 1.5–4.5)
Glucose: 95 mg/dL (ref 65–99)
Potassium: 4.4 mmol/L (ref 3.5–5.2)
Sodium: 138 mmol/L (ref 134–144)
Total Protein: 6.6 g/dL (ref 6.0–8.5)

## 2019-06-09 LAB — LIPID PANEL
Chol/HDL Ratio: 2.7 ratio (ref 0.0–4.4)
Cholesterol, Total: 153 mg/dL (ref 100–199)
HDL: 57 mg/dL (ref >39)
LDL Chol Calc (NIH): 74 mg/dL (ref 0–99)
Triglycerides: 123 mg/dL (ref 0–149)
VLDL Cholesterol Cal: 22 mg/dL (ref 5–40)

## 2019-06-09 MED ORDER — ATORVASTATIN CALCIUM 40 MG PO TABS: 40.0000 mg | ORAL_TABLET | Freq: Every day | ORAL | 1 refills | Status: DC

## 2019-06-09 MED ORDER — VERAPAMIL HCL 120 MG PO TABS: 120.0000 mg | ORAL_TABLET | Freq: Every day | ORAL | 1 refills | Status: DC

## 2019-06-09 MED ORDER — LEVOTHYROXINE SODIUM 88 MCG PO TABS: 88.0000 ug | ORAL_TABLET | Freq: Every day | ORAL | 1 refills | Status: DC

## 2019-06-09 MED ORDER — LISINOPRIL 40 MG PO TABS: 40.0000 mg | ORAL_TABLET | Freq: Every day | ORAL | 1 refills | Status: DC

## 2019-06-09 MED ORDER — OMEPRAZOLE 40 MG PO CPDR: 40.0000 mg | DELAYED_RELEASE_CAPSULE | Freq: Every day | ORAL | 1 refills | Status: DC

## 2019-06-09 NOTE — Patient Instructions (Signed)

## 2019-06-09 NOTE — Progress Notes (Signed)
Subjective:    Patient ID: Bonnie Tanner, female    DOB: 1941/08/11, 78 y.o.   MRN: [id number]   Chief Complaint: Medical Management of Chronic Issues    HPI:  1. Essential hypertension, benign No c/o chest pain, sob or headache. Does not check blood pressure at home. BP Readings from Last 3 Encounters:  06/09/19 129/60  11/18/18 (!) 160/81  08/19/18 (!) 145/71     2. Coronary artery disease due to lipid rich plaque Last saw cardiology was years ago- does not feel need to see one at this time.  3. Hyperlipidemia LDL goal <100 Watches diet most days bit does no exercsie. Lab Results  Component Value Date   CHOL 164 02/04/2018   HDL 57 02/04/2018   LDLCALC 80 02/04/2018   TRIG 135 02/04/2018   CHOLHDL 2.9 02/04/2018     4. Acquired hypothyroidism No problems that aware of Lab Results  Component Value Date   TSH 4.420 02/04/2018     5. Gastroesophageal reflux disease without esophagitis Is on omeprazole daily andis working well.  6. BMI 32.0-32.9,adult No recent weight changes    Outpatient Encounter Medications as of 06/09/2019  Medication Sig  . aspirin 325 MG tablet Take 325 mg by mouth daily.    Marland Kitchen atorvastatin (LIPITOR) 40 MG tablet Take 1 tablet (40 mg total) by mouth daily.  . cholecalciferol (VITAMIN D) 1000 UNITS tablet Take 1,000 Units by mouth daily.    Marland Kitchen levothyroxine (SYNTHROID) 88 MCG tablet Take 1 tablet (88 mcg total) by mouth daily before breakfast.  . lisinopril (ZESTRIL) 40 MG tablet Take 1 tablet (40 mg total) by mouth daily. (Needs to be seen before next refill)  . multivitamin-iron-minerals-folic acid (CENTRUM) chewable tablet Chew 1 tablet by mouth daily.    Marland Kitchen omeprazole (PRILOSEC) 40 MG capsule Take 1 capsule (40 mg total) by mouth daily. (Needs to be seen before next refill)  . verapamil (CALAN) 120 MG tablet Take 1 tablet (120 mg total) by mouth daily. (Needs to be seen before next refill)      Past Surgical History:  Procedure  Laterality Date  . BREAST LUMPECTOMY  04/19/2010   radiation therapy right breast  . CARDIAC CATHETERIZATION  09/2009  . CATARACT EXTRACTION  2006   bilateral  . EYE SURGERY  2000   made her ducts    Family History  Problem Relation Age of Onset  . Heart disease Mother 44  . Emphysema Father   . Heart disease Father   . Throat cancer Brother   . Stroke Brother   . Healthy Son   . Prostate cancer Paternal Uncle   . Stomach cancer Paternal Uncle     New complaints: None today  Social history: lives with her husband who recently retired  Controlled substance contract: n/a      Review of Systems  Constitutional: Negative for activity change and appetite change.  HENT: Negative.   Eyes: Negative for pain.  Respiratory: Negative for shortness of breath.   Cardiovascular: Negative for chest pain, palpitations and leg swelling.  Gastrointestinal: Negative for abdominal pain.  Endocrine: Negative for polydipsia.  Genitourinary: Negative.   Skin: Negative for rash.  Neurological: Negative for dizziness, weakness and headaches.  Hematological: Does not bruise/bleed easily.  Psychiatric/Behavioral: Negative.   All other systems reviewed and are negative.      Objective:   Physical Exam Vitals signs and nursing note reviewed.  Constitutional:      General: She is  not in acute distress.    Appearance: Normal appearance. She is well-developed.  HENT:     Head: Normocephalic.     Nose: Nose normal.  Eyes:     Pupils: Pupils are equal, round, and reactive to light.  Neck:     Musculoskeletal: Normal range of motion and neck supple.     Vascular: No carotid bruit or JVD.  Cardiovascular:     Rate and Rhythm: Normal rate and regular rhythm.     Heart sounds: Normal heart sounds.  Pulmonary:     Effort: Pulmonary effort is normal. No respiratory distress.     Breath sounds: Normal breath sounds. No wheezing or rales.  Chest:     Chest wall: No tenderness.   Abdominal:     General: Bowel sounds are normal. There is no distension or abdominal bruit.     Palpations: Abdomen is soft. There is no hepatomegaly, splenomegaly, mass or pulsatile mass.     Tenderness: There is no abdominal tenderness.  Musculoskeletal: Normal range of motion.  Lymphadenopathy:     Cervical: No cervical adenopathy.  Skin:    General: Skin is warm and dry.  Neurological:     Mental Status: She is alert and oriented to person, place, and time.     Deep Tendon Reflexes: Reflexes are normal and symmetric.  Psychiatric:        Behavior: Behavior normal.        Thought Content: Thought content normal.        Judgment: Judgment normal.     BP 129/60   Pulse (!) 55   Temp 98 F (36.7 C) (Oral)   Ht '4\' 11"'  (1.499 m)   Wt 173 lb (78.5 kg)   BMI 34.94 kg/m        Assessment & Plan:  Bonnie Tanner comes in today with chief complaint of Medical Management of Chronic Issues   Diagnosis and orders addressed:  1. Essential hypertension, benign Low sodium diet - lisinopril (ZESTRIL) 40 MG tablet; Take 1 tablet (40 mg total) by mouth daily. (Needs to be seen before next refill)  Dispense: 90 tablet; Refill: 1 - CMP14+EGFR  2. Coronary artery disease due to lipid rich plaque - verapamil (CALAN) 120 MG tablet; Take 1 tablet (120 mg total) by mouth daily. (Needs to be seen before next refill)  Dispense: 90 tablet; Refill: 1  3. Hyperlipidemia LDL goal <100 Low fat diet - atorvastatin (LIPITOR) 40 MG tablet; Take 1 tablet (40 mg total) by mouth daily.  Dispense: 90 tablet; Refill: 1 - Lipid panel  4. Acquired hypothyroidism - levothyroxine (SYNTHROID) 88 MCG tablet; Take 1 tablet (88 mcg total) by mouth daily before breakfast.  Dispense: 90 tablet; Refill: 1  5. Gastroesophageal reflux disease without esophagitis Avoid spicy foods Do not eat 2 hours prior to bedtime - omeprazole (PRILOSEC) 40 MG capsule; Take 1 capsule (40 mg total) by mouth daily. (Needs to be  seen before next refill)  Dispense: 90 capsule; Refill: 1  6. BMI 32.0-32.9,adult Discussed diet and exercise for person with BMI >25 Will recheck weight in 3-6 months    Labs pending Health Maintenance reviewed Diet and exercise encouraged  Follow up plan: 6 months   [name], FNP

## 2019-07-18 ENCOUNTER — Other Ambulatory Visit: Payer: Self-pay

## 2019-07-21 ENCOUNTER — Other Ambulatory Visit: Payer: Self-pay

## 2019-07-21 ENCOUNTER — Ambulatory Visit (INDEPENDENT_AMBULATORY_CARE_PROVIDER_SITE_OTHER): Payer: Medicare Other | Admitting: *Deleted

## 2019-07-21 DIAGNOSIS — Z23 Encounter for immunization: Secondary | ICD-10-CM | POA: Diagnosis not present

## 2019-09-03 ENCOUNTER — Telehealth: Payer: Self-pay | Admitting: Nurse Practitioner

## 2019-09-08 ENCOUNTER — Other Ambulatory Visit: Payer: Self-pay

## 2019-09-08 DIAGNOSIS — Z1231 Encounter for screening mammogram for malignant neoplasm of breast: Secondary | ICD-10-CM

## 2019-09-08 NOTE — Progress Notes (Signed)
mm

## 2019-09-11 NOTE — Telephone Encounter (Signed)
Orders faxed

## 2019-09-16 ENCOUNTER — Other Ambulatory Visit: Payer: Self-pay

## 2019-09-16 NOTE — Patient Outreach (Signed)
[facility]) Care Management  09/16/2019  Bonnie Tanner 22-May-1941 [id number]   Medication Adherence call to Bonnie Tanner Hippa Identifiers Verify spoke with patient she is past due on Atorvastatin 40 mg,patient explain she takes 1 tablet every other day not once daily,patient has plenty at this time,doctor is aware she is taking it this way.Bonnie Tanner is showing past due under [place].   [place] Management Direct Dial [phone]  Fax [phone] [email or web address]

## 2019-10-23 NOTE — Progress Notes (Signed)
Order for additional breast imaging has been signed and faxed to [facility].

## 2019-11-11 DIAGNOSIS — Z23 Encounter for immunization: Secondary | ICD-10-CM | POA: Diagnosis not present

## 2019-12-08 ENCOUNTER — Encounter: Payer: Self-pay | Admitting: Nurse Practitioner

## 2019-12-08 ENCOUNTER — Other Ambulatory Visit: Payer: Self-pay

## 2019-12-08 ENCOUNTER — Ambulatory Visit (INDEPENDENT_AMBULATORY_CARE_PROVIDER_SITE_OTHER): Payer: Medicare Other | Admitting: Nurse Practitioner

## 2019-12-08 VITALS — BP 136/63 | HR 57 | Temp 98.6°F | Resp 20 | Ht 59.0 in | Wt 174.0 lb

## 2019-12-08 DIAGNOSIS — I2510 Atherosclerotic heart disease of native coronary artery without angina pectoris: Secondary | ICD-10-CM | POA: Diagnosis not present

## 2019-12-08 DIAGNOSIS — Z6832 Body mass index (BMI) 32.0-32.9, adult: Secondary | ICD-10-CM

## 2019-12-08 DIAGNOSIS — E785 Hyperlipidemia, unspecified: Secondary | ICD-10-CM | POA: Diagnosis not present

## 2019-12-08 DIAGNOSIS — K219 Gastro-esophageal reflux disease without esophagitis: Secondary | ICD-10-CM

## 2019-12-08 DIAGNOSIS — I10 Essential (primary) hypertension: Secondary | ICD-10-CM

## 2019-12-08 DIAGNOSIS — E039 Hypothyroidism, unspecified: Secondary | ICD-10-CM

## 2019-12-08 DIAGNOSIS — I2583 Coronary atherosclerosis due to lipid rich plaque: Secondary | ICD-10-CM

## 2019-12-08 DIAGNOSIS — Z170 Estrogen receptor positive status [ER+]: Secondary | ICD-10-CM

## 2019-12-08 DIAGNOSIS — C50911 Malignant neoplasm of unspecified site of right female breast: Secondary | ICD-10-CM

## 2019-12-08 MED ORDER — LEVOTHYROXINE SODIUM 88 MCG PO TABS: 88.0000 ug | ORAL_TABLET | Freq: Every day | ORAL | 1 refills | Status: DC

## 2019-12-08 MED ORDER — LISINOPRIL 40 MG PO TABS: 40.0000 mg | ORAL_TABLET | Freq: Every day | ORAL | 1 refills | Status: DC

## 2019-12-08 MED ORDER — VERAPAMIL HCL 120 MG PO TABS: 120.0000 mg | ORAL_TABLET | Freq: Every day | ORAL | 1 refills | Status: DC

## 2019-12-08 MED ORDER — OMEPRAZOLE 40 MG PO CPDR: 40.0000 mg | DELAYED_RELEASE_CAPSULE | Freq: Every day | ORAL | 1 refills | Status: DC

## 2019-12-08 MED ORDER — ATORVASTATIN CALCIUM 40 MG PO TABS: 40.0000 mg | ORAL_TABLET | Freq: Every day | ORAL | 1 refills | Status: DC

## 2019-12-08 NOTE — Progress Notes (Signed)
Subjective:    Patient ID: Bonnie Tanner, female    DOB: 03-Aug-1941, 79 y.o.   MRN: [id number]   Chief Complaint: Medical Management of Chronic Issues    HPI:  1. Essential hypertension, benign No c/o chest pain, sob or headache. Does not check blood pressure at home. BP Readings from Last 3 Encounters:  06/09/19 129/60  11/18/18 (!) 160/81  08/19/18 (!) 145/71     2. Coronary artery disease due to lipid rich plaque Has not seen cardiology in years.  3. Hyperlipidemia LDL goal <100 Does not watch diet and does very little exercise. Lab Results  Component Value Date   CHOL 153 06/09/2019   HDL 57 06/09/2019   LDLCALC 74 06/09/2019   TRIG 123 06/09/2019   CHOLHDL 2.7 06/09/2019     4. Acquired hypothyroidism No complaints that she is aware of. Lab Results  Component Value Date   TSH 4.420 02/04/2018     5. Gastroesophageal reflux disease without esophagitis Is on omeprazole daily. Works well to keep symptoms  Under control.   6. Malignant neoplasm of right breast in female, estrogen receptor positive, unspecified site of breast ([place]) Breast cancer was in 2011. She is doing well. Has year;y mammograms.  7. BMI 32.0-32.9,adult No recent weight changes. Wt Readings from Last 3 Encounters:  12/08/19 174 lb (78.9 kg)  06/09/19 173 lb (78.5 kg)  11/18/18 170 lb (77.1 kg)   BMI Readings from Last 3 Encounters:  12/08/19 35.14 kg/m  06/09/19 34.94 kg/m  11/18/18 34.34 kg/m       Outpatient Encounter Medications as of 12/08/2019  Medication Sig  . aspirin 325 MG tablet Take 325 mg by mouth daily.    Marland Kitchen atorvastatin (LIPITOR) 40 MG tablet Take 1 tablet (40 mg total) by mouth daily.  . cholecalciferol (VITAMIN D) 1000 UNITS tablet Take 1,000 Units by mouth daily.    Marland Kitchen levothyroxine (SYNTHROID) 88 MCG tablet Take 1 tablet (88 mcg total) by mouth daily before breakfast.  . lisinopril (ZESTRIL) 40 MG tablet Take 1 tablet (40 mg total) by mouth daily. (Needs  to be seen before next refill)  . multivitamin-iron-minerals-folic acid (CENTRUM) chewable tablet Chew 1 tablet by mouth daily.    Marland Kitchen omeprazole (PRILOSEC) 40 MG capsule Take 1 capsule (40 mg total) by mouth daily. (Needs to be seen before next refill)  . verapamil (CALAN) 120 MG tablet Take 1 tablet (120 mg total) by mouth daily. (Needs to be seen before next refill)     Past Surgical History:  Procedure Laterality Date  . BREAST LUMPECTOMY  04/19/2010   radiation therapy right breast  . CARDIAC CATHETERIZATION  09/2009  . CATARACT EXTRACTION  2006   bilateral  . EYE SURGERY  2000   made her ducts    Family History  Problem Relation Age of Onset  . Heart disease Mother 3  . Emphysema Father   . Heart disease Father   . Throat cancer Brother   . Stroke Brother   . Healthy Son   . Prostate cancer Paternal Uncle   . 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   Controlled substance contract: n/a'   Review of Systems  Constitutional: Negative for diaphoresis.  Eyes: Negative for pain.  Respiratory: Negative for shortness of breath.   Cardiovascular: Negative for chest pain, palpitations and leg swelling.  Gastrointestinal: Negative for abdominal pain.  Endocrine: Negative for polydipsia.  Skin: Negative for rash.  Neurological: Negative for dizziness, weakness and headaches.  Hematological: Does not bruise/bleed easily.  All other systems reviewed and are negative.      Objective:   Physical Exam Vitals and nursing note reviewed.  Constitutional:      General: She is not in acute distress.    Appearance: Normal appearance. She is well-developed.  HENT:     Head: Normocephalic.     Nose: Nose normal.  Eyes:     Pupils: Pupils are equal, round, and reactive to light.  Neck:     Vascular: No carotid bruit or JVD.  Cardiovascular:     Rate and Rhythm: Normal rate and  regular rhythm.     Heart sounds: Normal heart sounds.  Pulmonary:     Effort: Pulmonary effort is normal. No respiratory distress.     Breath sounds: Normal breath sounds. No wheezing or rales.  Chest:     Chest wall: No tenderness.  Abdominal:     General: Bowel sounds are normal. There is no distension or abdominal bruit.     Palpations: Abdomen is soft. There is no hepatomegaly, splenomegaly, mass or pulsatile mass.     Tenderness: There is no abdominal tenderness.  Musculoskeletal:        General: Normal range of motion.     Cervical back: Normal range of motion and neck supple.  Lymphadenopathy:     Cervical: No cervical adenopathy.  Skin:    General: Skin is warm and dry.  Neurological:     Mental Status: She is alert and oriented to person, place, and time.     Deep Tendon Reflexes: Reflexes are normal and symmetric.  Psychiatric:        Behavior: Behavior normal.        Thought Content: Thought content normal.        Judgment: Judgment normal.    BP 136/63   Pulse (!) 57   Temp 98.6 F (37 C) (Temporal)   Resp 20   Ht 4\' 11"  (1.499 m)   Wt 174 lb (78.9 kg)   SpO2 97%   BMI 35.14 kg/m         Assessment & Plan:  JACKELINE HEINLE comes in today with chief complaint of Medical Management of Chronic Issues   Diagnosis and orders addressed:  1. Essential hypertension, benign Low sodium diet - lisinopril (ZESTRIL) 40 MG tablet; Take 1 tablet (40 mg total) by mouth daily. (Needs to be seen before next refill)  Dispense: 90 tablet; Refill: 1  2. Coronary artery disease due to lipid rich plaque Does not feel the need to see cardiology - verapamil (CALAN) 120 MG tablet; Take 1 tablet (120 mg total) by mouth daily. (Needs to be seen before next refill)  Dispense: 90 tablet; Refill: 1  3. Hyperlipidemia LDL goal <100 Low fat diet - atorvastatin (LIPITOR) 40 MG tablet; Take 1 tablet (40 mg total) by mouth daily.  Dispense: 90 tablet; Refill: 1  4. Acquired  hypothyroidism - levothyroxine (SYNTHROID) 88 MCG tablet; Take 1 tablet (88 mcg total) by mouth daily before breakfast.  Dispense: 90 tablet; Refill: 1  5. Gastroesophageal reflux disease without esophagitis Avoid spicy foods Do not eat 2 hours prior to bedtime - omeprazole (PRILOSEC) 40 MG capsule; Take 1 capsule (40 mg total) by mouth daily. (Needs to be seen before next refill)  Dispense: 90 capsule; Refill: 1  6. Malignant neoplasm of right breast  in female, estrogen receptor positive, unspecified site of breast ([place]) Continue yearly mammograms  7. BMI 32.0-32.9,adult Discussed diet and exercise for person with BMI >25 Will recheck weight in 3-6 months   Labs pending Health Maintenance reviewed Diet and exercise encouraged  Follow up plan: 6 months   [name], FNP

## 2019-12-08 NOTE — Patient Instructions (Signed)

## 2019-12-09 LAB — THYROID PANEL WITH TSH
Free Thyroxine Index: 2.9 (ref 1.2–4.9)
T3 Uptake Ratio: 29 % (ref 24–39)
T4, Total: 10 ug/dL (ref 4.5–12.0)
TSH: 2.29 u[IU]/mL (ref 0.450–4.500)

## 2019-12-09 LAB — LIPID PANEL
Chol/HDL Ratio: 3.5 ratio (ref 0.0–4.4)
Cholesterol, Total: 227 mg/dL — ABNORMAL HIGH (ref 100–199)
HDL: 65 mg/dL (ref >39)
LDL Chol Calc (NIH): 135 mg/dL — ABNORMAL HIGH (ref 0–99)
Triglycerides: 152 mg/dL — ABNORMAL HIGH (ref 0–149)
VLDL Cholesterol Cal: 27 mg/dL (ref 5–40)

## 2019-12-09 LAB — CBC WITH DIFFERENTIAL/PLATELET
Basophils Absolute: 0.1 10*3/uL (ref 0.0–0.2)
Basos: 1 %
EOS (ABSOLUTE): 0.5 10*3/uL — ABNORMAL HIGH (ref 0.0–0.4)
Eos: 7 %
Hematocrit: 41.2 % (ref 34.0–46.6)
Hemoglobin: 13.7 g/dL (ref 11.1–15.9)
Immature Grans (Abs): 0 10*3/uL (ref 0.0–0.1)
Immature Granulocytes: 0 %
Lymphocytes Absolute: 2.4 10*3/uL (ref 0.7–3.1)
Lymphs: 34 %
MCH: 32.9 pg (ref 26.6–33.0)
MCHC: 33.3 g/dL (ref 31.5–35.7)
MCV: 99 fL — ABNORMAL HIGH (ref 79–97)
Monocytes Absolute: 0.5 10*3/uL (ref 0.1–0.9)
Monocytes: 8 %
Neutrophils Absolute: 3.4 10*3/uL (ref 1.4–7.0)
Neutrophils: 50 %
Platelets: 251 10*3/uL (ref 150–450)
RBC: 4.17 x10E6/uL (ref 3.77–5.28)
RDW: 12.7 % (ref 11.7–15.4)
WBC: 6.9 10*3/uL (ref 3.4–10.8)

## 2019-12-09 LAB — CMP14+EGFR
ALT: 37 IU/L — ABNORMAL HIGH (ref 0–32)
AST: 35 IU/L (ref 0–40)
Albumin/Globulin Ratio: 2 (ref 1.2–2.2)
Albumin: 4.6 g/dL (ref 3.7–4.7)
Alkaline Phosphatase: 165 IU/L — ABNORMAL HIGH (ref 39–117)
BUN/Creatinine Ratio: 21 (ref 12–28)
BUN: 16 mg/dL (ref 8–27)
Bilirubin Total: 0.3 mg/dL (ref 0.0–1.2)
CO2: 22 mmol/L (ref 20–29)
Calcium: 10.6 mg/dL — ABNORMAL HIGH (ref 8.7–10.3)
Chloride: 102 mmol/L (ref 96–106)
Creatinine, Ser: 0.76 mg/dL (ref 0.57–1.00)
GFR calc Af Amer: 88 mL/min/{1.73_m2} (ref >59)
GFR calc non Af Amer: 76 mL/min/{1.73_m2} (ref >59)
Globulin, Total: 2.3 g/dL (ref 1.5–4.5)
Glucose: 92 mg/dL (ref 65–99)
Potassium: 4.3 mmol/L (ref 3.5–5.2)
Sodium: 140 mmol/L (ref 134–144)
Total Protein: 6.9 g/dL (ref 6.0–8.5)

## 2020-01-05 ENCOUNTER — Telehealth: Payer: Self-pay | Admitting: Nurse Practitioner

## 2020-01-05 NOTE — Chronic Care Management (AMB) (Signed)
  Chronic Care Management   Outreach Note  01/05/2020 Name: [name] MRN: [id number] DOB: [birth year]  Bonnie Tanner is a 79 y.o. year old female who is a primary care patient of Chevis Pretty, [place]. I reached out to [name] by phone today in response to a referral sent by Ms. Verne Carrow Parodi's health plan.     An unsuccessful telephone outreach was attempted today. The patient was referred to the case management team for assistance with care management and care coordination.   Follow Up Plan: The care management team will reach out to the patient again over the next 7 days. If patient returns call to provider office, please advise to call Gray at [phone].  [place], [place] [address] Direct Dial: [phone] [email or web address]: [email or web address]

## 2020-01-08 NOTE — Chronic Care Management (AMB) (Signed)
  Chronic Care Management   Note  01/08/2020 Name: Bonnie Tanner MRN: [id number] DOB: 09/19/1941  Bonnie Tanner is a 79 y.o. year old female who is a primary care patient of Chevis Pretty, [place]. I reached out to [name] by phone today in response to a referral sent by Bonnie Tanner's health plan.     Bonnie Tanner was given information about Chronic Care Management services today including:  1. CCM service includes personalized support from designated clinical staff supervised by her physician, including individualized plan of care and coordination with other care providers 2. 24/7 contact phone numbers for assistance for urgent and routine care needs. 3. Service will only be billed when office clinical staff spend 20 minutes or more in a month to coordinate care. 4. Only one practitioner may furnish and bill the service in a calendar month. 5. The patient may stop CCM services at any time (effective at the end of the month) by phone call to the office staff. 6. The patient will be responsible for cost sharing (co-pay) of up to 20% of the service fee (after annual deductible is met).  Patient did not agree to enrollment in care management services and does not wish to consider at this time.  Follow up plan: The patient has been provided with contact information for the care management team and has been advised to call with any health related questions or concerns.   [place],  [address] Direct Dial: [phone] [email or web address]: [email or web address]

## 2020-04-26 ENCOUNTER — Telehealth: Payer: Self-pay | Admitting: Nurse Practitioner

## 2020-04-26 DIAGNOSIS — R921 Mammographic calcification found on diagnostic imaging of breast: Secondary | ICD-10-CM | POA: Diagnosis not present

## 2020-04-26 DIAGNOSIS — R928 Other abnormal and inconclusive findings on diagnostic imaging of breast: Secondary | ICD-10-CM | POA: Diagnosis not present

## 2020-04-26 NOTE — Telephone Encounter (Signed)
Ok to hold blood thinner for 3-4 days prior to surgey then start back after surgery complete

## 2020-04-26 NOTE — Telephone Encounter (Signed)
Pt states that [name] is wanting to do a biopsy of her breast on Thursday and they are wanting to know if she can go off of her blood thinner a few days ahead of time.

## 2020-04-26 NOTE — Telephone Encounter (Signed)
Patient aware of recommendation.  

## 2020-04-29 DIAGNOSIS — R920 Mammographic microcalcification found on diagnostic imaging of breast: Secondary | ICD-10-CM | POA: Diagnosis not present

## 2020-04-29 DIAGNOSIS — N6021 Fibroadenosis of right breast: Secondary | ICD-10-CM | POA: Diagnosis not present

## 2020-04-29 DIAGNOSIS — R921 Mammographic calcification found on diagnostic imaging of breast: Secondary | ICD-10-CM | POA: Diagnosis not present

## 2020-05-16 ENCOUNTER — Other Ambulatory Visit: Payer: Self-pay | Admitting: Nurse Practitioner

## 2020-05-16 DIAGNOSIS — K219 Gastro-esophageal reflux disease without esophagitis: Secondary | ICD-10-CM

## 2020-05-16 DIAGNOSIS — I2510 Atherosclerotic heart disease of native coronary artery without angina pectoris: Secondary | ICD-10-CM

## 2020-05-16 DIAGNOSIS — I10 Essential (primary) hypertension: Secondary | ICD-10-CM

## 2020-05-16 DIAGNOSIS — E039 Hypothyroidism, unspecified: Secondary | ICD-10-CM

## 2020-06-10 ENCOUNTER — Ambulatory Visit: Payer: Self-pay | Admitting: Nurse Practitioner

## 2020-06-21 ENCOUNTER — Encounter: Payer: Self-pay | Admitting: Nurse Practitioner

## 2020-06-21 ENCOUNTER — Other Ambulatory Visit: Payer: Self-pay

## 2020-06-21 ENCOUNTER — Ambulatory Visit (INDEPENDENT_AMBULATORY_CARE_PROVIDER_SITE_OTHER): Payer: Medicare Other | Admitting: Nurse Practitioner

## 2020-06-21 VITALS — BP 130/56 | HR 54 | Temp 97.1°F | Resp 20 | Ht 59.0 in | Wt 170.0 lb

## 2020-06-21 DIAGNOSIS — E785 Hyperlipidemia, unspecified: Secondary | ICD-10-CM

## 2020-06-21 DIAGNOSIS — E039 Hypothyroidism, unspecified: Secondary | ICD-10-CM | POA: Diagnosis not present

## 2020-06-21 DIAGNOSIS — I2583 Coronary atherosclerosis due to lipid rich plaque: Secondary | ICD-10-CM

## 2020-06-21 DIAGNOSIS — Z6832 Body mass index (BMI) 32.0-32.9, adult: Secondary | ICD-10-CM

## 2020-06-21 DIAGNOSIS — K219 Gastro-esophageal reflux disease without esophagitis: Secondary | ICD-10-CM | POA: Diagnosis not present

## 2020-06-21 DIAGNOSIS — I2510 Atherosclerotic heart disease of native coronary artery without angina pectoris: Secondary | ICD-10-CM | POA: Diagnosis not present

## 2020-06-21 DIAGNOSIS — I10 Essential (primary) hypertension: Secondary | ICD-10-CM

## 2020-06-21 MED ORDER — OMEPRAZOLE 40 MG PO CPDR: 40.0000 mg | DELAYED_RELEASE_CAPSULE | Freq: Every day | ORAL | 1 refills | Status: DC

## 2020-06-21 MED ORDER — ATORVASTATIN CALCIUM 40 MG PO TABS: 40.0000 mg | ORAL_TABLET | Freq: Every day | ORAL | 1 refills | Status: DC

## 2020-06-21 MED ORDER — LISINOPRIL 40 MG PO TABS: 40.0000 mg | ORAL_TABLET | Freq: Every day | ORAL | 1 refills | Status: DC

## 2020-06-21 MED ORDER — VERAPAMIL HCL 120 MG PO TABS: 120.0000 mg | ORAL_TABLET | Freq: Every day | ORAL | 1 refills | Status: DC

## 2020-06-21 NOTE — Progress Notes (Signed)
Subjective:    Patient ID: Bonnie Tanner, female    DOB: 03/21/1941, 78 y.o.   MRN: [id number]   Chief Complaint: Medical Management of Chronic Issues    HPI:  1. Essential hypertension, benign No c/o chest pain, sob or headache. Does not check blood pressure at home. BP Readings from Last 3 Encounters:  06/21/20 (!) 130/56  12/08/19 136/63  06/09/19 129/60     2. Coronary artery disease due to lipid rich plaque She has not seen her cardiologist in several years. Says she is fine and does not feel need at this time.  3. Hyperlipidemia LDL goal <100 Does not wtahc diet and does very little exercise. Lab Results  Component Value Date   CHOL 227 (H) 12/08/2019   HDL 65 12/08/2019   LDLCALC 135 (H) 12/08/2019   TRIG 152 (H) 12/08/2019   CHOLHDL 3.5 12/08/2019   The 10-year ASCVD risk score Mikey Bussing DC Jr., et al., 2013) is: 24.3%   Values used to calculate the score:     Age: 42 years     Sex: Female     Is Non-Hispanic African American: No     Diabetic: No     Tobacco smoker: No     Systolic Blood Pressure: 993 mmHg     Is BP treated: Yes     HDL Cholesterol: 65 mg/dL     Total Cholesterol: 227 mg/dL   4. Acquired hypothyroidism No problems that she is aware of. Lab Results  Component Value Date   TSH 2.290 12/08/2019     5. Gastroesophageal reflux disease without esophagitis Is on omeprazole daily and is doing well.  6. BMI 32.0-32.9,adult No recent weight changes  Wt Readings from Last 3 Encounters:  06/21/20 170 lb (77.1 kg)  12/08/19 174 lb (78.9 kg)  06/09/19 173 lb (78.5 kg)   BMI Readings from Last 3 Encounters:  06/21/20 34.34 kg/m  12/08/19 35.14 kg/m  06/09/19 34.94 kg/m       Outpatient Encounter Medications as of 06/21/2020  Medication Sig  . aspirin 325 MG tablet Take 325 mg by mouth daily.    Marland Kitchen atorvastatin (LIPITOR) 40 MG tablet Take 1 tablet (40 mg total) by mouth daily.  . cholecalciferol (VITAMIN D) 1000 UNITS tablet Take  1,000 Units by mouth daily.    . Glucosamine-Chondroit-Vit C-Mn (GLUCOSAMINE 1500 COMPLEX PO) Take by mouth.  . levothyroxine (SYNTHROID) 88 MCG tablet TAKE 1 TABLET BY MOUTH  DAILY BEFORE BREAKFAST  . lisinopril (ZESTRIL) 40 MG tablet TAKE 1 TABLET BY MOUTH  DAILY  . multivitamin-iron-minerals-folic acid (CENTRUM) chewable tablet Chew 1 tablet by mouth daily.    Marland Kitchen omeprazole (PRILOSEC) 40 MG capsule TAKE 1 CAPSULE BY MOUTH  DAILY  . verapamil (CALAN) 120 MG tablet TAKE 1 TABLET BY MOUTH  DAILY     Past Surgical History:  Procedure Laterality Date  . BREAST LUMPECTOMY  04/19/2010   radiation therapy right breast  . CARDIAC CATHETERIZATION  09/2009  . CATARACT EXTRACTION  2006   bilateral  . EYE SURGERY  2000   made her ducts    Family History  Problem Relation Age of Onset  . Heart disease Mother 15  . Emphysema Father   . Heart disease Father   . Throat cancer Brother   . Stroke Brother   . Healthy Son   . Prostate cancer Paternal Uncle   . Stomach cancer Paternal Uncle     New complaints: None today  Social  history: Lives with her husband- is still working  Controlled substance contract: n/a    Review of Systems  Constitutional: Negative for diaphoresis.  Eyes: Negative for pain.  Respiratory: Negative for shortness of breath.   Cardiovascular: Negative for chest pain, palpitations and leg swelling.  Gastrointestinal: Negative for abdominal pain.  Endocrine: Negative for polydipsia.  Skin: Negative for rash.  Neurological: Negative for dizziness, weakness and headaches.  Hematological: Does not bruise/bleed easily.  All other systems reviewed and are negative.      Objective:   Physical Exam Vitals and nursing note reviewed.  Constitutional:      General: She is not in acute distress.    Appearance: Normal appearance. She is well-developed.  HENT:     Head: Normocephalic.     Nose: Nose normal.  Eyes:     Pupils: Pupils are equal, round, and  reactive to light.  Neck:     Vascular: No carotid bruit or JVD.  Cardiovascular:     Rate and Rhythm: Normal rate and regular rhythm.     Heart sounds: Normal heart sounds.  Pulmonary:     Effort: Pulmonary effort is normal. No respiratory distress.     Breath sounds: Normal breath sounds. No wheezing or rales.  Chest:     Chest wall: No tenderness.  Abdominal:     General: Bowel sounds are normal. There is no distension or abdominal bruit.     Palpations: Abdomen is soft. There is no hepatomegaly, splenomegaly, mass or pulsatile mass.     Tenderness: There is no abdominal tenderness.  Musculoskeletal:        General: Normal range of motion.     Cervical back: Normal range of motion and neck supple.     Right lower leg: Edema (1+) present.     Left lower leg: Edema (1+) present.  Lymphadenopathy:     Cervical: No cervical adenopathy.  Skin:    General: Skin is warm and dry.  Neurological:     Mental Status: She is alert and oriented to person, place, and time.     Deep Tendon Reflexes: Reflexes are normal and symmetric.  Psychiatric:        Behavior: Behavior normal.        Thought Content: Thought content normal.        Judgment: Judgment normal.    .BP (!) 130/56   Pulse (!) 54   Temp (!) 97.1 F (36.2 C) (Temporal)   Resp 20   Ht '4\' 11"'  (1.499 m)   Wt 170 lb (77.1 kg)   SpO2 98%   BMI 34.34 kg/m         Assessment & Plan:  Bonnie Tanner comes in today with chief complaint of Medical Management of Chronic Issues   Diagnosis and orders addressed:  1. Essential hypertension, benign Low sodium diet - CBC with Differential/Platelet - CMP14+EGFR - lisinopril (ZESTRIL) 40 MG tablet; Take 1 tablet (40 mg total) by mouth daily.  Dispense: 90 tablet; Refill: 1  2. Coronary artery disease due to lipid rich plaque - verapamil (CALAN) 120 MG tablet; Take 1 tablet (120 mg total) by mouth daily.  Dispense: 90 tablet; Refill: 1  3. Hyperlipidemia LDL goal <100 Low  fat diet - Lipid panel - atorvastatin (LIPITOR) 40 MG tablet; Take 1 tablet (40 mg total) by mouth daily.  Dispense: 90 tablet; Refill: 1  4. Acquired hypothyroidism Labs pending - Thyroid Panel With TSH  5. Gastroesophageal reflux disease without  esophagitis Avoid spicy foods Do not eat 2 hours prior to bedtime - omeprazole (PRILOSEC) 40 MG capsule; Take 1 capsule (40 mg total) by mouth daily.  Dispense: 90 capsule; Refill: 1  6. BMI 32.0-32.9,adult Discussed diet and exercise for person with BMI >25 Will recheck weight in 3-6 months    Labs pending Health Maintenance reviewed Diet and exercise encouraged  Follow up plan: 6 months   [name], FNP

## 2020-06-21 NOTE — Patient Instructions (Signed)
Peripheral Edema  Peripheral edema is swelling that is caused by a buildup of fluid. Peripheral edema most often affects the lower legs, ankles, and feet. It can also develop in the arms, hands, and face. The area of the body that has peripheral edema will look swollen. It may also feel heavy or warm. Your clothes may start to feel tight. Pressing on the area may make a temporary dent in your skin. You may not be able to move your swollen arm or leg as much as usual. There are many causes of peripheral edema. It can happen because of a complication of other conditions such as congestive heart failure, kidney disease, or a problem with your blood circulation. It also can be a side effect of certain medicines or because of an infection. It often happens to women during pregnancy. Sometimes, the cause is not known. Follow these instructions at home: Managing pain, stiffness, and swelling   Raise (elevate) your legs while you are sitting or lying down.  Move around often to prevent stiffness and to lessen swelling.  Do not sit or stand for long periods of time.  Wear support stockings as told by your health care provider. Medicines  Take over-the-counter and prescription medicines only as told by your health care provider.  Your health care provider may prescribe medicine to help your body get rid of excess water (diuretic). General instructions  Pay attention to any changes in your symptoms.  Follow instructions from your health care provider about limiting salt (sodium) in your diet. Sometimes, eating less salt may reduce swelling.  Moisturize skin daily to help prevent skin from cracking and draining.  Keep all follow-up visits as told by your health care provider. This is important. Contact a health care provider if you have:  A fever.  Edema that starts suddenly or is getting worse, especially if you are pregnant or have a medical condition.  Swelling in only one leg.  Increased  swelling, redness, or pain in one or both of your legs.  Drainage or sores at the area where you have edema. Get help right away if you:  Develop shortness of breath, especially when you are lying down.  Have pain in your chest or abdomen.  Feel weak.  Feel faint. Summary  Peripheral edema is swelling that is caused by a buildup of fluid. Peripheral edema most often affects the lower legs, ankles, and feet.  Move around often to prevent stiffness and to lessen swelling. Do not sit or stand for long periods of time.  Pay attention to any changes in your symptoms.  Contact a health care provider if you have edema that starts suddenly or is getting worse, especially if you are pregnant or have a medical condition.  Get help right away if you develop shortness of breath, especially when lying down. This information is not intended to replace advice given to you by your health care provider. Make sure you discuss any questions you have with your health care provider. Document Revised: 06/19/2018 Document Reviewed: 06/19/2018 Elsevier Patient Education  2020 [employer]  

## 2020-06-22 LAB — CBC WITH DIFFERENTIAL/PLATELET
Basophils Absolute: 0.1 10*3/uL (ref 0.0–0.2)
Basos: 1 %
EOS (ABSOLUTE): 0.4 10*3/uL (ref 0.0–0.4)
Eos: 5 %
Hematocrit: 39.5 % (ref 34.0–46.6)
Hemoglobin: 13.6 g/dL (ref 11.1–15.9)
Immature Grans (Abs): 0 10*3/uL (ref 0.0–0.1)
Immature Granulocytes: 0 %
Lymphocytes Absolute: 2.8 10*3/uL (ref 0.7–3.1)
Lymphs: 37 %
MCH: 33.6 pg — ABNORMAL HIGH (ref 26.6–33.0)
MCHC: 34.4 g/dL (ref 31.5–35.7)
MCV: 98 fL — ABNORMAL HIGH (ref 79–97)
Monocytes Absolute: 0.5 10*3/uL (ref 0.1–0.9)
Monocytes: 7 %
Neutrophils Absolute: 3.6 10*3/uL (ref 1.4–7.0)
Neutrophils: 50 %
Platelets: 254 10*3/uL (ref 150–450)
RBC: 4.05 x10E6/uL (ref 3.77–5.28)
RDW: 12 % (ref 11.7–15.4)
WBC: 7.4 10*3/uL (ref 3.4–10.8)

## 2020-06-22 LAB — CMP14+EGFR
ALT: 29 IU/L (ref 0–32)
AST: 24 IU/L (ref 0–40)
Albumin/Globulin Ratio: 1.8 (ref 1.2–2.2)
Albumin: 4.4 g/dL (ref 3.7–4.7)
Alkaline Phosphatase: 170 IU/L — ABNORMAL HIGH (ref 44–121)
BUN/Creatinine Ratio: 16 (ref 12–28)
BUN: 17 mg/dL (ref 8–27)
Bilirubin Total: 0.3 mg/dL (ref 0.0–1.2)
CO2: 24 mmol/L (ref 20–29)
Calcium: 10.8 mg/dL — ABNORMAL HIGH (ref 8.7–10.3)
Chloride: 102 mmol/L (ref 96–106)
Creatinine, Ser: 1.04 mg/dL — ABNORMAL HIGH (ref 0.57–1.00)
GFR calc Af Amer: 60 mL/min/{1.73_m2} (ref >59)
GFR calc non Af Amer: 52 mL/min/{1.73_m2} — ABNORMAL LOW (ref >59)
Globulin, Total: 2.5 g/dL (ref 1.5–4.5)
Glucose: 89 mg/dL (ref 65–99)
Potassium: 4.1 mmol/L (ref 3.5–5.2)
Sodium: 140 mmol/L (ref 134–144)
Total Protein: 6.9 g/dL (ref 6.0–8.5)

## 2020-06-22 LAB — LIPID PANEL
Chol/HDL Ratio: 2.5 ratio (ref 0.0–4.4)
Cholesterol, Total: 157 mg/dL (ref 100–199)
HDL: 63 mg/dL (ref >39)
LDL Chol Calc (NIH): 75 mg/dL (ref 0–99)
Triglycerides: 104 mg/dL (ref 0–149)
VLDL Cholesterol Cal: 19 mg/dL (ref 5–40)

## 2020-06-22 LAB — THYROID PANEL WITH TSH
Free Thyroxine Index: 2.4 (ref 1.2–4.9)
T3 Uptake Ratio: 26 % (ref 24–39)
T4, Total: 9.2 ug/dL (ref 4.5–12.0)
TSH: 2.43 u[IU]/mL (ref 0.450–4.500)

## 2020-10-24 ENCOUNTER — Other Ambulatory Visit: Payer: Self-pay | Admitting: Nurse Practitioner

## 2020-10-24 DIAGNOSIS — I2583 Coronary atherosclerosis due to lipid rich plaque: Secondary | ICD-10-CM

## 2020-10-24 DIAGNOSIS — I10 Essential (primary) hypertension: Secondary | ICD-10-CM

## 2020-10-24 DIAGNOSIS — I2510 Atherosclerotic heart disease of native coronary artery without angina pectoris: Secondary | ICD-10-CM

## 2020-10-24 DIAGNOSIS — K219 Gastro-esophageal reflux disease without esophagitis: Secondary | ICD-10-CM

## 2020-10-25 ENCOUNTER — Ambulatory Visit: Payer: Medicare Other

## 2020-11-15 ENCOUNTER — Ambulatory Visit (INDEPENDENT_AMBULATORY_CARE_PROVIDER_SITE_OTHER): Payer: Medicare Other

## 2020-11-15 ENCOUNTER — Other Ambulatory Visit: Payer: Self-pay

## 2020-11-15 DIAGNOSIS — Z23 Encounter for immunization: Secondary | ICD-10-CM

## 2020-12-20 ENCOUNTER — Ambulatory Visit: Payer: Self-pay | Admitting: Nurse Practitioner

## 2020-12-27 ENCOUNTER — Ambulatory Visit (INDEPENDENT_AMBULATORY_CARE_PROVIDER_SITE_OTHER): Payer: Medicare Other | Admitting: Nurse Practitioner

## 2020-12-27 ENCOUNTER — Other Ambulatory Visit: Payer: Self-pay

## 2020-12-27 ENCOUNTER — Encounter: Payer: Self-pay | Admitting: Nurse Practitioner

## 2020-12-27 VITALS — BP 154/86 | HR 67 | Temp 98.3°F | Resp 20 | Ht 59.0 in | Wt 166.0 lb

## 2020-12-27 DIAGNOSIS — K219 Gastro-esophageal reflux disease without esophagitis: Secondary | ICD-10-CM | POA: Diagnosis not present

## 2020-12-27 DIAGNOSIS — I2583 Coronary atherosclerosis due to lipid rich plaque: Secondary | ICD-10-CM | POA: Diagnosis not present

## 2020-12-27 DIAGNOSIS — I10 Essential (primary) hypertension: Secondary | ICD-10-CM | POA: Diagnosis not present

## 2020-12-27 DIAGNOSIS — E785 Hyperlipidemia, unspecified: Secondary | ICD-10-CM | POA: Diagnosis not present

## 2020-12-27 DIAGNOSIS — I2510 Atherosclerotic heart disease of native coronary artery without angina pectoris: Secondary | ICD-10-CM | POA: Diagnosis not present

## 2020-12-27 DIAGNOSIS — E039 Hypothyroidism, unspecified: Secondary | ICD-10-CM

## 2020-12-27 DIAGNOSIS — Z6832 Body mass index (BMI) 32.0-32.9, adult: Secondary | ICD-10-CM

## 2020-12-27 MED ORDER — LEVOTHYROXINE SODIUM 88 MCG PO TABS: 88.0000 ug | ORAL_TABLET | Freq: Every day | ORAL | 1 refills | Status: DC

## 2020-12-27 MED ORDER — ATORVASTATIN CALCIUM 40 MG PO TABS: 40.0000 mg | ORAL_TABLET | Freq: Every day | ORAL | 1 refills | Status: DC

## 2020-12-27 MED ORDER — VERAPAMIL HCL 120 MG PO TABS: 120.0000 mg | ORAL_TABLET | Freq: Every day | ORAL | 1 refills | Status: DC

## 2020-12-27 MED ORDER — OMEPRAZOLE 40 MG PO CPDR: 40.0000 mg | DELAYED_RELEASE_CAPSULE | Freq: Every day | ORAL | 1 refills | Status: DC

## 2020-12-27 MED ORDER — LISINOPRIL 40 MG PO TABS: 40.0000 mg | ORAL_TABLET | Freq: Every day | ORAL | 1 refills | Status: DC

## 2020-12-27 NOTE — Patient Instructions (Signed)

## 2020-12-27 NOTE — Progress Notes (Signed)
 Subjective:    Patient ID: Bonnie Tanner, female    DOB: 03/16/1941, 79 y.o.   MRN: [id number]   Chief Complaint: Medical Management of Chronic Issues    HPI:  1. Essential hypertension, benign No c/o chest pain, sob or headache. Does  check blood pressure at home. Runs in the 90 systolic. BP Readings from Last 3 Encounters:  12/27/20 (!) 154/86  06/21/20 (!) 130/56  12/08/19 136/63     2. Hyperlipidemia LDL goal <100 Tries to watch diet and does very little exercise Lab Results  Component Value Date   CHOL 157 06/21/2020   HDL 63 06/21/2020   LDLCALC 75 06/21/2020   TRIG 104 06/21/2020   CHOLHDL 2.5 06/21/2020   The 10-year ASCVD risk score (Goff DC Jr., et al., 2013) is: 35.3%   3. Coronary artery disease due to lipid rich plaque As not seen cardiology in quite sometime.  4. Gastroesophageal reflux disease without esophagitis Is on omeprazole daily and is doing well.  5. Acquired hypothyroidism Is on levothyroxine 88mcg daily. Doing well  6. BMI 32.0-32.9,adult Weight is down 4lbs Wt Readings from Last 3 Encounters:  12/27/20 166 lb (75.3 kg)  06/21/20 170 lb (77.1 kg)  12/08/19 174 lb (78.9 kg)   BMI Readings from Last 3 Encounters:  12/27/20 33.53 kg/m  06/21/20 34.34 kg/m  12/08/19 35.14 kg/m       Outpatient Encounter Medications as of 12/27/2020  Medication Sig  . aspirin 325 MG tablet Take 325 mg by mouth daily.  . atorvastatin (LIPITOR) 40 MG tablet Take 1 tablet (40 mg total) by mouth daily.  . cholecalciferol (VITAMIN D) 1000 UNITS tablet Take 1,000 Units by mouth daily.  . Glucosamine-Chondroit-Vit C-Mn (GLUCOSAMINE 1500 COMPLEX PO) Take by mouth.  . levothyroxine (SYNTHROID) 88 MCG tablet TAKE 1 TABLET BY MOUTH  DAILY BEFORE BREAKFAST  . lisinopril (ZESTRIL) 40 MG tablet TAKE 1 TABLET BY MOUTH  DAILY  . multivitamin-iron-minerals-folic acid (CENTRUM) chewable tablet Chew 1 tablet by mouth daily.  . omeprazole (PRILOSEC) 40 MG capsule  TAKE 1 CAPSULE BY MOUTH  DAILY  . verapamil (CALAN) 120 MG tablet TAKE 1 TABLET BY MOUTH  DAILY   No facility-administered encounter medications on file as of 12/27/2020.    Past Surgical History:  Procedure Laterality Date  . BREAST LUMPECTOMY  04/19/2010   radiation therapy right breast  . CARDIAC CATHETERIZATION  09/2009  . CATARACT EXTRACTION  2006   bilateral  . EYE SURGERY  2000   made her ducts    Family History  Problem Relation Age of Onset  . Heart disease Mother 60  . Emphysema Father   . Heart disease Father   . Throat cancer Brother   . Stroke Brother   . Healthy Son   . Prostate cancer Paternal Uncle   . Stomach cancer Paternal Uncle     New complaints: One today  Social history: Lives with husband. 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  Eyes: Negative for pain.  Respiratory: Negative for shortness of breath.   Cardiovascular: Negative for chest pain, palpitations and leg swelling.  Gastrointestinal: Negative for abdominal pain.  Endocrine: Negative for polydipsia.  Skin: Negative for rash.  Neurological: Negative for dizziness, weakness and headaches.  Hematological: Does not bruise/bleed easily.  All other systems reviewed and are negative.      Objective:   Physical Exam   Vitals and nursing note reviewed.  Constitutional:      General: She is not in acute distress.    Appearance: Normal appearance. She is well-developed.  HENT:     Head: Normocephalic.     Nose: Nose normal.  Eyes:     Pupils: Pupils are equal, round, and reactive to light.  Neck:     Vascular: No carotid bruit or JVD.  Cardiovascular:     Rate and Rhythm: Normal rate and regular rhythm.     Heart sounds: Normal heart sounds.  Pulmonary:     Effort: Pulmonary effort is normal. No respiratory distress.     Breath sounds: Normal breath sounds. No wheezing or rales.   Chest:     Chest wall: No tenderness.  Abdominal:     General: Bowel sounds are normal. There is no distension or abdominal bruit.     Palpations: Abdomen is soft. There is no hepatomegaly, splenomegaly, mass or pulsatile mass.     Tenderness: There is no abdominal tenderness.  Musculoskeletal:        General: Normal range of motion.     Cervical back: Normal range of motion and neck supple.  Lymphadenopathy:     Cervical: No cervical adenopathy.  Skin:    General: Skin is warm and dry.  Neurological:     Mental Status: She is alert and oriented to person, place, and time.     Deep Tendon Reflexes: Reflexes are normal and symmetric.  Psychiatric:        Behavior: Behavior normal.        Thought Content: Thought content normal.        Judgment: Judgment normal.     BP (!) 154/86   Pulse 67   Temp 98.3 F (36.8 C) (Temporal)   Resp 20   Ht 4' 11" (1.499 m)   Wt 166 lb (75.3 kg)   SpO2 96%   BMI 33.53 kg/m  * bloodpressure is always up in office.      Assessment & Plan:  Bonnie Tanner comes in today with chief complaint of Medical Management of Chronic Issues   Diagnosis and orders addressed:  1. Essential hypertension, benign Low sodium diet - lisinopril (ZESTRIL) 40 MG tablet; Take 1 tablet (40 mg total) by mouth daily.  Dispense: 90 tablet; Refill: 1 - CBC with Differential/Platelet - CMP14+EGFR  2. Hyperlipidemia LDL goal <100 Low fta diet - atorvastatin (LIPITOR) 40 MG tablet; Take 1 tablet (40 mg total) by mouth daily.  Dispense: 90 tablet; Refill: 1 - Lipid panel  3. Coronary artery disease due to lipid rich plaque Follow up with cardiolgy as needed - verapamil (CALAN) 120 MG tablet; Take 1 tablet (120 mg total) by mouth daily.  Dispense: 90 tablet; Refill: 1  4. Gastroesophageal reflux disease without esophagitis Avoid spicy foods Do not eat 2 hours prior to bedtime - omeprazole (PRILOSEC) 40 MG capsule; Take 1 capsule (40 mg total) by mouth daily.   Dispense: 90 capsule; Refill: 1  5. Acquired hypothyroidism Labs pending - levothyroxine (SYNTHROID) 88 MCG tablet; Take 1 tablet (88 mcg total) by mouth daily before breakfast.  Dispense: 90 tablet; Refill: 1 - Thyroid Panel With TSH  6. BMI 32.0-32.9,adult Discussed diet and exercise for person with BMI >25 Will recheck weight in 3-6 months    Labs pending Health Maintenance reviewed Diet and exercise encouraged  Follow up plan: 6 months   Mary-Margaret Martin, FNP  

## 2020-12-28 LAB — CMP14+EGFR
ALT: 24 IU/L (ref 0–32)
AST: 25 IU/L (ref 0–40)
Albumin/Globulin Ratio: 1.8 (ref 1.2–2.2)
Albumin: 4.6 g/dL (ref 3.7–4.7)
Alkaline Phosphatase: 162 IU/L — ABNORMAL HIGH (ref 44–121)
BUN/Creatinine Ratio: 14 (ref 12–28)
BUN: 15 mg/dL (ref 8–27)
Bilirubin Total: 0.3 mg/dL (ref 0.0–1.2)
CO2: 25 mmol/L (ref 20–29)
Calcium: 10.5 mg/dL — ABNORMAL HIGH (ref 8.7–10.3)
Chloride: 102 mmol/L (ref 96–106)
Creatinine, Ser: 1.07 mg/dL — ABNORMAL HIGH (ref 0.57–1.00)
Globulin, Total: 2.5 g/dL (ref 1.5–4.5)
Glucose: 94 mg/dL (ref 65–99)
Potassium: 4.9 mmol/L (ref 3.5–5.2)
Sodium: 141 mmol/L (ref 134–144)
Total Protein: 7.1 g/dL (ref 6.0–8.5)
eGFR: 53 mL/min/{1.73_m2} — ABNORMAL LOW (ref >59)

## 2020-12-28 LAB — CBC WITH DIFFERENTIAL/PLATELET
Basophils Absolute: 0.1 10*3/uL (ref 0.0–0.2)
Basos: 1 %
EOS (ABSOLUTE): 0.4 10*3/uL (ref 0.0–0.4)
Eos: 5 %
Hematocrit: 41.7 % (ref 34.0–46.6)
Hemoglobin: 13.8 g/dL (ref 11.1–15.9)
Immature Grans (Abs): 0 10*3/uL (ref 0.0–0.1)
Immature Granulocytes: 0 %
Lymphocytes Absolute: 2.6 10*3/uL (ref 0.7–3.1)
Lymphs: 34 %
MCH: 33.7 pg — ABNORMAL HIGH (ref 26.6–33.0)
MCHC: 33.1 g/dL (ref 31.5–35.7)
MCV: 102 fL — ABNORMAL HIGH (ref 79–97)
Monocytes Absolute: 0.5 10*3/uL (ref 0.1–0.9)
Monocytes: 7 %
Neutrophils Absolute: 4.1 10*3/uL (ref 1.4–7.0)
Neutrophils: 53 %
Platelets: 267 10*3/uL (ref 150–450)
RBC: 4.1 x10E6/uL (ref 3.77–5.28)
RDW: 12.3 % (ref 11.7–15.4)
WBC: 7.6 10*3/uL (ref 3.4–10.8)

## 2020-12-28 LAB — THYROID PANEL WITH TSH
Free Thyroxine Index: 3.1 (ref 1.2–4.9)
T3 Uptake Ratio: 28 % (ref 24–39)
T4, Total: 11 ug/dL (ref 4.5–12.0)
TSH: 4 u[IU]/mL (ref 0.450–4.500)

## 2020-12-28 LAB — LIPID PANEL
Chol/HDL Ratio: 2.8 ratio (ref 0.0–4.4)
Cholesterol, Total: 165 mg/dL (ref 100–199)
HDL: 58 mg/dL (ref >39)
LDL Chol Calc (NIH): 82 mg/dL (ref 0–99)
Triglycerides: 147 mg/dL (ref 0–149)
VLDL Cholesterol Cal: 25 mg/dL (ref 5–40)

## 2021-03-29 ENCOUNTER — Ambulatory Visit (INDEPENDENT_AMBULATORY_CARE_PROVIDER_SITE_OTHER): Payer: Medicare Other

## 2021-03-29 VITALS — Ht 59.0 in | Wt 165.0 lb

## 2021-03-29 DIAGNOSIS — Z1211 Encounter for screening for malignant neoplasm of colon: Secondary | ICD-10-CM

## 2021-03-29 DIAGNOSIS — Z1231 Encounter for screening mammogram for malignant neoplasm of breast: Secondary | ICD-10-CM | POA: Diagnosis not present

## 2021-03-29 DIAGNOSIS — Z0000 Encounter for general adult medical examination without abnormal findings: Secondary | ICD-10-CM

## 2021-03-29 DIAGNOSIS — Z780 Asymptomatic menopausal state: Secondary | ICD-10-CM | POA: Diagnosis not present

## 2021-03-29 NOTE — Progress Notes (Signed)
Subjective:   Bonnie Tanner is a 80 y.o. female who presents for Medicare Annual (Subsequent) preventive examination.  Virtual Visit via Telephone Note  I connected with  Bonnie Tanner on 03/29/21 at  3:30 PM EDT by telephone and verified that I am speaking with the correct person using two identifiers.  Location: Patient: Home Provider: [facility] Persons participating in the virtual visit: patient/Nurse Health Advisor   I discussed the limitations, risks, security and privacy concerns of performing an evaluation and management service by telephone and the availability of in person appointments. The patient expressed understanding and agreed to proceed.  Interactive audio and video telecommunications were attempted between this nurse and patient, however failed, due to patient having technical difficulties OR patient did not have access to video capability.  We continued and completed visit with audio only.  Some vital signs may be absent or patient reported.   Ragna Kramlich E Isabella Roemmich, LPN   Review of Systems     Cardiac Risk Factors include: advanced age (>4men, >92 women);dyslipidemia;obesity (BMI >30kg/m2);hypertension     Objective:    Today's Vitals   03/29/21 1536  Weight: 165 lb (74.8 kg)  Height: 4\' 11"  (1.499 m)   Body mass index is 33.33 kg/m.  Advanced Directives 03/29/2021 02/02/2015  Does Patient Have a Medical Advance Directive? Yes No  Type of Paramedic of [place];Living will -  Copy of [place] in Chart? No - copy requested -  Would patient like information on creating a medical advance directive? - Yes - Educational materials given    Current Medications (verified) Outpatient Encounter Medications as of 03/29/2021  Medication Sig   aspirin 325 MG tablet Take 325 mg by mouth daily.   atorvastatin (LIPITOR) 40 MG tablet Take 1 tablet (40 mg total) by mouth daily.   cholecalciferol (VITAMIN D) 1000 UNITS tablet Take 1,000  Units by mouth daily.   Glucosamine-Chondroit-Vit C-Mn (GLUCOSAMINE 1500 COMPLEX PO) Take by mouth.   levothyroxine (SYNTHROID) 88 MCG tablet Take 1 tablet (88 mcg total) by mouth daily before breakfast.   lisinopril (ZESTRIL) 40 MG tablet Take 1 tablet (40 mg total) by mouth daily.   multivitamin-iron-minerals-folic acid (CENTRUM) chewable tablet Chew 1 tablet by mouth daily.   omeprazole (PRILOSEC) 40 MG capsule Take 1 capsule (40 mg total) by mouth daily.   verapamil (CALAN) 120 MG tablet Take 1 tablet (120 mg total) by mouth daily.   No facility-administered encounter medications on file as of 03/29/2021.    Allergies (verified) Erythromycin, Latex, Nickel, Penicillins, and Sulfonamide derivatives   History: Past Medical History:  Diagnosis Date   Arthritis    CAD (coronary artery disease)    Non obstructive   Cancer ([place]) 2011   breast/radiation Tx/Lumpectomy   GERD (gastroesophageal reflux disease)    Heart murmur    Hyperlipidemia    Hypertension    Hypothyroidism    Past Surgical History:  Procedure Laterality Date   BREAST LUMPECTOMY  04/19/2010   radiation therapy right breast   CARDIAC CATHETERIZATION  09/2009   CATARACT EXTRACTION  2006   bilateral   EYE SURGERY  2000   made her ducts   Family History  Problem Relation Age of Onset   Heart disease Mother 87   Emphysema Father    Heart disease Father    Throat cancer Brother    Stroke Brother    Healthy Son    Prostate cancer Paternal Uncle    Stomach  cancer Paternal Uncle    Social History   Socioeconomic History   Marital status: Married    Spouse name: Not on file   Number of children: 1   Years of education: 14   Highest education level: Some college, no degree  Occupational History   Occupation: Beautician  Tobacco Use   Smoking status: Never   Smokeless tobacco: Never  Vaping Use   Vaping Use: Never used  Substance and Sexual Activity   Alcohol use: No   Drug use: No   Sexual activity:  Yes  Other Topics Concern   Not on file  Social History Narrative   Lives in [place] with husband. 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   Social Determinants of Radio broadcast assistant Strain: Low Risk    Difficulty of Paying Living Expenses: Not hard at all  Food Insecurity: No Food Insecurity   Worried About Charity fundraiser in the Last Year: Never true   Arboriculturist in the Last Year: Never true  Transportation Needs: No Transportation Needs   Lack of Transportation (Medical): No   Lack of Transportation (Non-Medical): No  Physical Activity: Insufficiently Active   Days of Exercise per Week: 5 days   Minutes of Exercise per Session: 20 min  Stress: No Stress Concern Present   Feeling of Stress : Not at all  Social Connections: Socially Integrated   Frequency of Communication with Friends and Family: More than three times a week   Frequency of Social Gatherings with Friends and Family: More than three times a week   Attends Religious Services: More than 4 times per year   Active Member of [employer] or Organizations: Yes   Attends Music therapist: More than 4 times per year   Marital Status: Married    Tobacco Counseling Counseling given: Not Answered   Clinical Intake:  Pre-visit preparation completed: Yes  Pain : No/denies pain     BMI - recorded: 33.33 Nutritional Status: BMI > 30  Obese Nutritional Risks: None Diabetes: No  How often do you need to have someone help you when you read instructions, pamphlets, or other written materials from your doctor or pharmacy?: 1 - Never  Diabetic? No  Interpreter Needed?: No  Information entered by :: Am yHopkins, LPN   Activities of Daily Living In your present state of health, do you have any difficulty performing the following activities: 03/29/2021 06/21/2020  Hearing? N N  Vision? N Y  Comment - Wears glasses all the time  Difficulty  concentrating or making decisions? Y N  Walking or climbing stairs? N N  Dressing or bathing? N N  Doing errands, shopping? N N  Preparing Food and eating ? N -  Using the Toilet? N -  In the past six months, have you accidently leaked urine? N -  Do you have problems with loss of bowel control? N -  Managing your Medications? N -  Managing your Finances? N -  Housekeeping or managing your Housekeeping? N -  Some recent data might be hidden    Patient Care Team: Chevis Pretty, FNP as PCP - General (Nurse Practitioner) Hilarie Fredrickson Lajuan Lines, MD as Consulting Physician (Gastroenterology)  Indicate any recent Medical Services you may have received from other than [facility] providers in the past year (date may be approximate).     Assessment:   This is a routine  wellness examination for Arayna.  Hearing/Vision screen Hearing Screening - Comments:: Mild hearing loss in left ear - has chronic tinnitus - declines hearing aids Vision Screening - Comments:: Wears eyeglasses - up to date with annual eye exam with [facility] in [place]  Dietary issues and exercise activities discussed: Current Exercise Habits: The patient has a physically strenuous job, but has no regular exercise apart from work., Exercise limited by: None identified   Goals Addressed             This Visit's Progress    Exercise 3x per week (30 min per time)   On track    Walk for 30 minutes 3 times per week        Depression Screen PHQ 2/9 Scores 03/29/2021 12/27/2020 06/21/2020 12/08/2019 06/09/2019 11/18/2018 08/19/2018  PHQ - 2 Score 0 0 0 0 0 0 0  PHQ- 9 Score - - - - - - 0    Fall Risk Fall Risk  03/29/2021 12/27/2020 06/21/2020 12/08/2019 06/09/2019  Falls in the past year? 0 0 0 0 0  Number falls in past yr: 0 - - - -  Injury with Fall? 0 - - - -  Risk for fall due to : No Fall Risks - - - -  Follow up Falls prevention discussed - - - -    FALL RISK PREVENTION PERTAINING TO THE HOME:  Any stairs in or around  the home? Yes  If so, are there any without handrails? No  Home free of loose throw rugs in walkways, pet beds, electrical cords, etc? Yes  Adequate lighting in your home to reduce risk of falls? Yes   ASSISTIVE DEVICES UTILIZED TO PREVENT FALLS:  Life alert? No  Use of a cane, walker or w/c? No  Grab bars in the bathroom? No  Shower chair or bench in shower? No  Elevated toilet seat or a handicapped toilet? No   TIMED UP AND GO:  Was the test performed? No . Telephonic visit.  Cognitive Function: MMSE - Mini Mental State Exam 06/24/2018 02/02/2015  Not completed: - Refused  Orientation to time 5 5  Orientation to Place 5 5  Registration 3 3  Attention/ Calculation 5 5  Recall 3 3  Language- name 2 objects 2 2  Language- repeat 1 1  Language- follow 3 step command 3 3  Language- read & follow direction 1 1  Write a sentence 1 1  Copy design 1 1  Total score 30 30     6CIT Screen 03/29/2021  What Year? 0 points  What month? 0 points  What time? 0 points  Count back from 20 0 points  Months in reverse 0 points  Repeat phrase 0 points  Total Score 0    Immunizations Immunization History  Administered Date(s) Administered   Fluad Quad(high Dose 65+) 07/21/2019, 11/15/2020   Influenza Whole 07/09/2010   Influenza, High Dose Seasonal PF 09/05/2016, 08/06/2017, 08/20/2018   Influenza,inj,Quad PF,6+ Mos 10/06/2013, 08/29/2014, 08/23/2015   Moderna SARS-COV2 Booster Vaccination 08/09/2020   Moderna Sars-Covid-2 Vaccination 10/14/2019, 11/11/2019   Pneumococcal Conjugate-13 02/02/2015   Pneumococcal Polysaccharide-23 10/21/2012   Td 07/09/2009    TDAP status: Due, Education has been provided regarding the importance of this vaccine. Advised may receive this vaccine at local pharmacy or Health Dept. Aware to provide a copy of the vaccination record if obtained from local pharmacy or Health Dept. Verbalized acceptance and understanding.  Flu Vaccine status: Up to  date  Pneumococcal  vaccine status: Up to date  Covid-19 vaccine status: Completed vaccines  Qualifies for Shingles Vaccine? Yes   Zostavax completed No   Shingrix Completed?: No.    Education has been provided regarding the importance of this vaccine. Patient has been advised to call insurance company to determine out of pocket expense if they have not yet received this vaccine. Advised may also receive vaccine at local pharmacy or Health Dept. Verbalized acceptance and understanding.  Screening Tests Health Maintenance  Topic Date Due   Zoster Vaccines- Shingrix (1 of 2) Never done   DEXA SCAN  02/06/2020   MAMMOGRAM  09/30/2020   COVID-19 Vaccine (4 - Booster for Moderna series) 11/09/2020   TETANUS/TDAP  12/27/2021 (Originally 07/10/2019)   Hepatitis C Screening  12/27/2021 (Originally 08/06/1959)   INFLUENZA VACCINE  05/09/2021   PNA vac Low Risk Adult  Completed   HPV VACCINES  Aged Out    Health Maintenance  Health Maintenance Due  Topic Date Due   Zoster Vaccines- Shingrix (1 of 2) Never done   DEXA SCAN  02/06/2020   MAMMOGRAM  09/30/2020   COVID-19 Vaccine (4 - Booster for Moderna series) 11/09/2020    Colorectal cancer screening: Type of screening: Cologuard. Completed 02/2018. Repeat every 3 years ordered today  Mammogram status: Ordered 03/29/21. Pt provided with contact info and advised to call to schedule appt.   Bone Density status: Ordered 03/29/21. Pt provided with contact info and advised to call to schedule appt.  Lung Cancer Screening: (Low Dose CT Chest recommended if Age 7-80 years, 30 pack-year currently smoking OR have quit w/in 15years.) does not qualify.   Additional Screening:  Hepatitis C Screening: does not qualify  Vision Screening: Recommended annual ophthalmology exams for early detection of glaucoma and other disorders of the eye. Is the patient up to date with their annual eye exam?  Yes  Who is the provider or what is the name of the  office in which the patient attends annual eye exams? [facility] in [place] If pt is not established with a provider, would they like to be referred to a provider to establish care? No .   Dental Screening: Recommended annual dental exams for proper oral hygiene  Community Resource Referral / Chronic Care Management: CRR required this visit?  No   CCM required this visit?  No      Plan:     I have personally reviewed and noted the following in the patient's chart:   Medical and social history Use of alcohol, tobacco or illicit drugs  Current medications and supplements including opioid prescriptions.  Functional ability and status Nutritional status Physical activity Advanced directives List of other physicians Hospitalizations, surgeries, and ER visits in previous 12 months Vitals Screenings to include cognitive, depression, and falls Referrals and appointments  In addition, I have reviewed and discussed with patient certain preventive protocols, quality metrics, and best practice recommendations. A written personalized care plan for preventive services as well as general preventive health recommendations were provided to patient.     Sandrea Hammond, LPN   06/06/5620   Nurse Notes: None

## 2021-03-29 NOTE — Patient Instructions (Signed)
Bonnie Tanner , Thank you for taking time to come for your Medicare Wellness Visit. I appreciate your ongoing commitment to your health goals. Please review the following plan we discussed and let me know if I can assist you in the future.   Screening recommendations/referrals: Colonoscopy: Cologuard done 02/18/2018 - repeat every 3 years (ordered repeat today) Mammogram: Done 10/23/2019 - Repeat annually - ordered today Bone Density: Done 02/05/2018 - Repeat every 2 years - ordered today Recommended yearly ophthalmology/optometry visit for glaucoma screening and checkup Recommended yearly dental visit for hygiene and checkup  Vaccinations: Influenza vaccine: Done 11/15/20 - repeat in fall Pneumococcal vaccine: Done 10/21/2012 & 02/02/2015 Tdap vaccine: Done 07/09/2009 - Repeat in 10 years - past due Shingles vaccine: Due Shingrix discussed. Please contact your pharmacy for coverage information.    Covid-19: Done 10/14/2019, 11/11/2019, & 08/09/2020  Advanced directives: Please bring a copy of your health care power of attorney and living will to the office to be added to your chart at your convenience.  Conditions/risks identified: Aim for 30 minutes of exercise or brisk walking each day, drink 6-8 glasses of water and eat lots of fruits and vegetables.  Next appointment: Follow up in one year for your annual wellness visit    Preventive Care 78 Years and Older, Female Preventive care refers to lifestyle choices and visits with your health care provider that can promote health and wellness. What does preventive care include? A yearly physical exam. This is also called an annual well check. Dental exams once or twice a year. Routine eye exams. Ask your health care provider how often you should have your eyes checked. Personal lifestyle choices, including: Daily care of your teeth and gums. Regular physical activity. Eating a healthy diet. Avoiding tobacco and drug use. Limiting alcohol  use. Practicing safe sex. Taking low-dose aspirin every day. Taking vitamin and mineral supplements as recommended by your health care provider. What happens during an annual well check? The services and screenings done by your health care provider during your annual well check will depend on your age, overall health, lifestyle risk factors, and family history of disease. Counseling  Your health care provider may ask you questions about your: Alcohol use. Tobacco use. Drug use. Emotional well-being. Home and relationship well-being. Sexual activity. Eating habits. History of falls. Memory and ability to understand (cognition). Work and work Statistician. Reproductive health. Screening  You may have the following tests or measurements: Height, weight, and BMI. Blood pressure. Lipid and cholesterol levels. These may be checked every 5 years, or more frequently if you are over 56 years old. Skin check. Lung cancer screening. You may have this screening every year starting at age 78 if you have a 30-pack-year history of smoking and currently smoke or have quit within the past 15 years. Fecal occult blood test (FOBT) of the stool. You may have this test every year starting at age 78. Flexible sigmoidoscopy or colonoscopy. You may have a sigmoidoscopy every 5 years or a colonoscopy every 10 years starting at age 65. Hepatitis C blood test. Hepatitis B blood test. Sexually transmitted disease (STD) testing. Diabetes screening. This is done by checking your blood sugar (glucose) after you have not eaten for a while (fasting). You may have this done every 1-3 years. Bone density scan. This is done to screen for osteoporosis. You may have this done starting at age 78. Mammogram. This may be done every 1-2 years. Talk to your health care provider about how often  you should have regular mammograms. Talk with your health care provider about your test results, treatment options, and if necessary,  the need for more tests. Vaccines  Your health care provider may recommend certain vaccines, such as: Influenza vaccine. This is recommended every year. Tetanus, diphtheria, and acellular pertussis (Tdap, Td) vaccine. You may need a Td booster every 10 years. Zoster vaccine. You may need this after age 66. Pneumococcal 13-valent conjugate (PCV13) vaccine. One dose is recommended after age 72. Pneumococcal polysaccharide (PPSV23) vaccine. One dose is recommended after age 60. Talk to your health care provider about which screenings and vaccines you need and how often you need them. This information is not intended to replace advice given to you by your health care provider. Make sure you discuss any questions you have with your health care provider. Document Released: 10/22/2015 Document Revised: 06/14/2016 Document Reviewed: 07/27/2015 Elsevier Interactive Patient Education  2017 [place] Prevention in the Home Falls can cause injuries. They can happen to people of all ages. There are many things you can do to make your home safe and to help prevent falls. What can I do on the outside of my home? Regularly fix the edges of walkways and driveways and fix any cracks. Remove anything that might make you trip as you walk through a door, such as a raised step or threshold. Trim any bushes or trees on the path to your home. Use bright outdoor lighting. Clear any walking paths of anything that might make someone trip, such as rocks or tools. Regularly check to see if handrails are loose or broken. Make sure that both sides of any steps have handrails. Any raised decks and porches should have guardrails on the edges. Have any leaves, snow, or ice cleared regularly. Use sand or salt on walking paths during winter. Clean up any spills in your garage right away. This includes oil or grease spills. What can I do in the bathroom? Use night lights. Install grab bars by the toilet and in the  tub and shower. Do not use towel bars as grab bars. Use non-skid mats or decals in the tub or shower. If you need to sit down in the shower, use a plastic, non-slip stool. Keep the floor dry. Clean up any water that spills on the floor as soon as it happens. Remove soap buildup in the tub or shower regularly. Attach bath mats securely with double-sided non-slip rug tape. Do not have throw rugs and other things on the floor that can make you trip. What can I do in the bedroom? Use night lights. Make sure that you have a light by your bed that is easy to reach. Do not use any sheets or blankets that are too big for your bed. They should not hang down onto the floor. Have a firm chair that has side arms. You can use this for support while you get dressed. Do not have throw rugs and other things on the floor that can make you trip. What can I do in the kitchen? Clean up any spills right away. Avoid walking on wet floors. Keep items that you use a lot in easy-to-reach places. If you need to reach something above you, use a strong step stool that has a grab bar. Keep electrical cords out of the way. Do not use floor polish or wax that makes floors slippery. If you must use wax, use non-skid floor wax. Do not have throw rugs and other things on  the floor that can make you trip. What can I do with my stairs? Do not leave any items on the stairs. Make sure that there are handrails on both sides of the stairs and use them. Fix handrails that are broken or loose. Make sure that handrails are as long as the stairways. Check any carpeting to make sure that it is firmly attached to the stairs. Fix any carpet that is loose or worn. Avoid having throw rugs at the top or bottom of the stairs. If you do have throw rugs, attach them to the floor with carpet tape. Make sure that you have a light switch at the top of the stairs and the bottom of the stairs. If you do not have them, ask someone to add them for  you. What else can I do to help prevent falls? Wear shoes that: Do not have high heels. Have rubber bottoms. Are comfortable and fit you well. Are closed at the toe. Do not wear sandals. If you use a stepladder: Make sure that it is fully opened. Do not climb a closed stepladder. Make sure that both sides of the stepladder are locked into place. Ask someone to hold it for you, if possible. Clearly mark and make sure that you can see: Any grab bars or handrails. First and last steps. Where the edge of each step is. Use tools that help you move around (mobility aids) if they are needed. These include: Canes. Walkers. Scooters. Crutches. Turn on the lights when you go into a dark area. Replace any light bulbs as soon as they burn out. Set up your furniture so you have a clear path. Avoid moving your furniture around. If any of your floors are uneven, fix them. If there are any pets around you, be aware of where they are. Review your medicines with your doctor. Some medicines can make you feel dizzy. This can increase your chance of falling. Ask your doctor what other things that you can do to help prevent falls. This information is not intended to replace advice given to you by your health care provider. Make sure you discuss any questions you have with your health care provider. Document Released: 07/22/2009 Document Revised: 03/02/2016 Document Reviewed: 10/30/2014 Elsevier Interactive Patient Education  2017 [employer].

## 2021-04-15 ENCOUNTER — Other Ambulatory Visit: Payer: Self-pay

## 2021-04-15 ENCOUNTER — Ambulatory Visit (INDEPENDENT_AMBULATORY_CARE_PROVIDER_SITE_OTHER): Payer: Medicare Other | Admitting: Nurse Practitioner

## 2021-04-15 ENCOUNTER — Encounter: Payer: Self-pay | Admitting: Nurse Practitioner

## 2021-04-15 VITALS — BP 130/62 | HR 64 | Temp 98.2°F | Resp 20 | Ht 59.0 in | Wt 162.0 lb

## 2021-04-15 DIAGNOSIS — B372 Candidiasis of skin and nail: Secondary | ICD-10-CM | POA: Diagnosis not present

## 2021-04-15 MED ORDER — NYSTATIN 100000 UNIT/GM EX CREA: 1.0000 "application " | TOPICAL_CREAM | Freq: Two times a day (BID) | CUTANEOUS | 0 refills | Status: AC

## 2021-04-15 NOTE — Patient Instructions (Signed)
Skin Yeast Infection  A skin yeast infection is a condition in which there is an overgrowth of yeast (candida) that normally lives on the skin. This condition usually occurs in areas ofthe skin that are constantly warm and moist, such as the armpits or the groin. What are the causes? This condition is caused by a change in the normal balance of the yeast andbacteria that live on the skin. What increases the risk? You are more likely to develop this condition if you: Are obese. Are pregnant. Take birth control pills. Have diabetes. Take antibiotic medicines. Take steroid medicines. Are malnourished. Have a weak body defense system (immune system). Are 65 years of age or older. Wear tight clothing. What are the signs or symptoms? The most common symptom of this condition is itchiness in the affected area. Other symptoms include: Red, swollen area of the skin. Bumps on the skin. How is this diagnosed? This condition is diagnosed with a medical history and physical exam. Your health care provider may check for yeast by taking light scrapings of the skin to be viewed under a microscope. How is this treated? This condition is treated with medicine. Medicines may be prescribed or be available over the counter. The medicines may be: Taken by mouth (orally). Applied as a cream or powder to your skin. Follow these instructions at home:  Take or apply over-the-counter and prescription medicines only as told by your health care provider. Maintain a healthy weight. If you need help losing weight, talk with your health care provider. Keep your skin clean and dry. If you have diabetes, keep your blood sugar under control. Keep all follow-up visits as told by your health care provider. This is important. Contact a health care provider if: Your symptoms go away and then return. Your symptoms do not get better with treatment. Your symptoms get worse. Your rash spreads. You have a fever or  chills. You have new symptoms. You have new warmth or redness of your skin. Summary A skin yeast infection is a condition in which there is an overgrowth of yeast (candida) that normally lives on the skin. This condition is caused by a change in the normal balance of the yeast and bacteria that live on the skin. Take or apply over-the-counter and prescription medicines only as told by your health care provider. Keep your skin clean and dry. Contact a health care provider if your symptoms do not get better with treatment. This information is not intended to replace advice given to you by your health care provider. Make sure you discuss any questions you have with your healthcare provider. Document Revised: 02/12/2018 Document Reviewed: 02/12/2018 Elsevier Patient Education  2022 Elsevier Inc.  

## 2021-04-15 NOTE — Progress Notes (Signed)
   Subjective:    Patient ID: Bonnie Tanner, female    DOB: 05-25-1941, 80 y.o.   MRN: [id number]   Chief Complaint: red and irritated under breast   HPI Patient come sin today c/o rash under bil breasts. Started 2 weeks ago. She was outside working and was sweating. Now has raw burning itchy rash under bil breasts.    Review of Systems  Constitutional:  Negative for diaphoresis.  Eyes:  Negative for pain.  Respiratory:  Negative for shortness of breath.   Cardiovascular:  Negative for chest pain, palpitations and leg swelling.  Gastrointestinal:  Negative for abdominal pain.  Endocrine: Negative for polydipsia.  Skin:  Negative for rash.  Neurological:  Negative for dizziness, weakness and headaches.  Hematological:  Does not bruise/bleed easily.  All other systems reviewed and are negative.     Objective:   Physical Exam Vitals and nursing note reviewed.  Cardiovascular:     Rate and Rhythm: Normal rate and regular rhythm.     Heart sounds: Normal heart sounds.  Pulmonary:     Effort: Pulmonary effort is normal.     Breath sounds: Normal breath sounds.  Skin:    General: Skin is warm.     Findings: Rash (erythematous raw mosit appearing rash bil mammory fold.) present.  Neurological:     General: No focal deficit present.     Mental Status: She is alert and oriented to person, place, and time.  Psychiatric:        Mood and Affect: Mood normal.        Behavior: Behavior normal.    BP 130/62   Pulse 64   Temp 98.2 F (36.8 C) (Temporal)   Resp 20   Ht 4\' 11"  (1.499 m)   Wt 162 lb (73.5 kg)   SpO2 95%   BMI 32.72 kg/m        Assessment & Plan:   Bonnie Tanner in today with chief complaint of red and irritated under breast   1. Cutaneous candidiasis Dry under breast with hair dryer prior to applying cream Keep area as dry as possible No scratching or rubbing of area. - nystatin cream (MYCOSTATIN); Apply 1 application topically 2 (two) times daily.   Dispense: 30 g; Refill: 0    The above assessment and management plan was discussed with the patient. The patient verbalized understanding of and has agreed to the management plan. Patient is aware to call the clinic if symptoms persist or worsen. Patient is aware when to return to the clinic for a follow-up visit. Patient educated on when it is appropriate to go to the emergency department.   [name], FNP

## 2021-04-25 ENCOUNTER — Ambulatory Visit (INDEPENDENT_AMBULATORY_CARE_PROVIDER_SITE_OTHER): Payer: Medicare Other

## 2021-04-25 ENCOUNTER — Other Ambulatory Visit: Payer: Self-pay

## 2021-04-25 DIAGNOSIS — Z780 Asymptomatic menopausal state: Secondary | ICD-10-CM

## 2021-04-26 ENCOUNTER — Telehealth: Payer: Self-pay | Admitting: Nurse Practitioner

## 2021-04-26 DIAGNOSIS — Z780 Asymptomatic menopausal state: Secondary | ICD-10-CM | POA: Diagnosis not present

## 2021-04-26 DIAGNOSIS — M8588 Other specified disorders of bone density and structure, other site: Secondary | ICD-10-CM | POA: Diagnosis not present

## 2021-04-26 DIAGNOSIS — M85851 Other specified disorders of bone density and structure, right thigh: Secondary | ICD-10-CM | POA: Diagnosis not present

## 2021-04-26 NOTE — Telephone Encounter (Signed)
PATIENT IS HAVING BREAST PAINS OFF AND ON

## 2021-04-27 ENCOUNTER — Other Ambulatory Visit: Payer: Self-pay

## 2021-04-27 DIAGNOSIS — N644 Mastodynia: Secondary | ICD-10-CM

## 2021-05-09 DIAGNOSIS — N61 Mastitis without abscess: Secondary | ICD-10-CM | POA: Diagnosis not present

## 2021-05-09 DIAGNOSIS — N644 Mastodynia: Secondary | ICD-10-CM | POA: Diagnosis not present

## 2021-06-27 ENCOUNTER — Other Ambulatory Visit: Payer: Self-pay

## 2021-06-27 ENCOUNTER — Encounter: Payer: Self-pay | Admitting: Nurse Practitioner

## 2021-06-27 ENCOUNTER — Ambulatory Visit (INDEPENDENT_AMBULATORY_CARE_PROVIDER_SITE_OTHER): Payer: Medicare Other | Admitting: Nurse Practitioner

## 2021-06-27 VITALS — BP 134/72 | HR 72 | Temp 97.9°F | Resp 20 | Ht 59.0 in | Wt 164.0 lb

## 2021-06-27 DIAGNOSIS — Z170 Estrogen receptor positive status [ER+]: Secondary | ICD-10-CM | POA: Diagnosis not present

## 2021-06-27 DIAGNOSIS — K219 Gastro-esophageal reflux disease without esophagitis: Secondary | ICD-10-CM | POA: Diagnosis not present

## 2021-06-27 DIAGNOSIS — B029 Zoster without complications: Secondary | ICD-10-CM

## 2021-06-27 DIAGNOSIS — I2583 Coronary atherosclerosis due to lipid rich plaque: Secondary | ICD-10-CM

## 2021-06-27 DIAGNOSIS — I2510 Atherosclerotic heart disease of native coronary artery without angina pectoris: Secondary | ICD-10-CM | POA: Diagnosis not present

## 2021-06-27 DIAGNOSIS — E785 Hyperlipidemia, unspecified: Secondary | ICD-10-CM | POA: Diagnosis not present

## 2021-06-27 DIAGNOSIS — Z6833 Body mass index (BMI) 33.0-33.9, adult: Secondary | ICD-10-CM

## 2021-06-27 DIAGNOSIS — Z23 Encounter for immunization: Secondary | ICD-10-CM | POA: Diagnosis not present

## 2021-06-27 DIAGNOSIS — C50911 Malignant neoplasm of unspecified site of right female breast: Secondary | ICD-10-CM | POA: Diagnosis not present

## 2021-06-27 DIAGNOSIS — E039 Hypothyroidism, unspecified: Secondary | ICD-10-CM | POA: Diagnosis not present

## 2021-06-27 DIAGNOSIS — I10 Essential (primary) hypertension: Secondary | ICD-10-CM | POA: Diagnosis not present

## 2021-06-27 MED ORDER — LISINOPRIL 40 MG PO TABS: 40.0000 mg | ORAL_TABLET | Freq: Every day | ORAL | 1 refills | Status: DC

## 2021-06-27 MED ORDER — VERAPAMIL HCL 120 MG PO TABS: 120.0000 mg | ORAL_TABLET | Freq: Every day | ORAL | 1 refills | Status: DC

## 2021-06-27 MED ORDER — OMEPRAZOLE 40 MG PO CPDR: 40.0000 mg | DELAYED_RELEASE_CAPSULE | Freq: Every day | ORAL | 1 refills | Status: DC

## 2021-06-27 MED ORDER — LEVOTHYROXINE SODIUM 88 MCG PO TABS: 88.0000 ug | ORAL_TABLET | Freq: Every day | ORAL | 1 refills | Status: DC

## 2021-06-27 MED ORDER — ATORVASTATIN CALCIUM 40 MG PO TABS: 40.0000 mg | ORAL_TABLET | Freq: Every day | ORAL | 1 refills | Status: DC

## 2021-06-27 NOTE — Patient Instructions (Signed)

## 2021-06-27 NOTE — Progress Notes (Signed)
Subjective:    Patient ID: Bonnie Tanner, female    DOB: 1941/01/09, 80 y.o.   MRN: [id number]   Chief Complaint: Medical Management of Chronic Issues    HPI:  1. Coronary artery disease due to lipid rich plaque Is currently not seeing cardiology  2. Gastroesophageal reflux disease without esophagitis Is on omeprazole daily and is working well.  3. Essential hypertension, benign No c/o chest pain, sob or headache. Does not check blood pressure at home. BP Readings from Last 3 Encounters:  06/27/21 134/72  04/15/21 130/62  12/27/20 (!) 154/86     4. Acquired hypothyroidism No problems that she is aware of  Lab Results  Component Value Date   TSH 4.000 12/27/2020     5. Hyperlipidemia LDL goal <100 Doe snot watch diet and does little to no exercise. Lab Results  Component Value Date   CHOL 165 12/27/2020   HDL 58 12/27/2020   LDLCALC 82 12/27/2020   TRIG 147 12/27/2020   CHOLHDL 2.8 12/27/2020     6. Need for immunization against influenza Flu shot today  7. Malignant neoplasm of right breast in female, estrogen receptor positive, unspecified site of breast [facility]) She no longer sees oncology.  8. BMI 32.0-32.9,adult No recent weight changes Wt Readings from Last 3 Encounters:  06/27/21 164 lb (74.4 kg)  04/15/21 162 lb (73.5 kg)  03/29/21 165 lb (74.8 kg)   BMI Readings from Last 3 Encounters:  06/27/21 33.12 kg/m  04/15/21 32.72 kg/m  03/29/21 33.33 kg/m       Outpatient Encounter Medications as of 06/27/2021  Medication Sig   aspirin 325 MG tablet Take 325 mg by mouth daily.   atorvastatin (LIPITOR) 40 MG tablet Take 1 tablet (40 mg total) by mouth daily.   cholecalciferol (VITAMIN D) 1000 UNITS tablet Take 1,000 Units by mouth daily.   Glucosamine-Chondroit-Vit C-Mn (GLUCOSAMINE 1500 COMPLEX PO) Take by mouth.   levothyroxine (SYNTHROID) 88 MCG tablet Take 1 tablet (88 mcg total) by mouth daily before breakfast.   lisinopril (ZESTRIL) 40  MG tablet Take 1 tablet (40 mg total) by mouth daily.   multivitamin-iron-minerals-folic acid (CENTRUM) chewable tablet Chew 1 tablet by mouth daily.   nystatin cream (MYCOSTATIN) Apply 1 application topically 2 (two) times daily.   omeprazole (PRILOSEC) 40 MG capsule Take 1 capsule (40 mg total) by mouth daily.   verapamil (CALAN) 120 MG tablet Take 1 tablet (120 mg total) by mouth daily.   No facility-administered encounter medications on file as of 06/27/2021.    Past Surgical History:  Procedure Laterality Date   BREAST LUMPECTOMY  04/19/2010   radiation therapy right breast   CARDIAC CATHETERIZATION  09/2009   CATARACT EXTRACTION  2006   bilateral   EYE SURGERY  2000   made her ducts    Family History  Problem Relation Age of Onset   Heart disease Mother 35   Emphysema Father    Heart disease Father    Throat cancer Brother    Stroke Brother    Healthy Son    Prostate cancer Paternal Uncle    Stomach cancer Paternal Uncle     New complaints: Had breast u/s 3 weeks ago. Broke out in rash on back at posterior bra line several days later. The rash itch and then it started hurting.   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  Eyes:  Negative for pain.  Respiratory:  Negative for shortness of breath.   Cardiovascular:  Negative for chest pain, palpitations and leg swelling.  Gastrointestinal:  Negative for abdominal pain.  Endocrine: Negative for polydipsia.  Skin:  Negative for rash.  Neurological:  Negative for dizziness, weakness and headaches.  Hematological:  Does not bruise/bleed easily.  All other systems reviewed and are negative.     Objective:   Physical Exam Vitals and nursing note reviewed.  Constitutional:      General: She is not in acute distress.    Appearance: Normal appearance. She is well-developed.  HENT:      Head: Normocephalic.     Right Ear: Tympanic membrane normal.     Left Ear: Tympanic membrane normal.     Nose: Nose normal.     Mouth/Throat:     Mouth: Mucous membranes are moist.  Eyes:     Pupils: Pupils are equal, round, and reactive to light.  Neck:     Vascular: No carotid bruit or JVD.  Cardiovascular:     Rate and Rhythm: Normal rate and regular rhythm.     Heart sounds: Normal heart sounds.  Pulmonary:     Effort: Pulmonary effort is normal. No respiratory distress.     Breath sounds: Normal breath sounds. No wheezing or rales.  Chest:     Chest wall: No tenderness.  Abdominal:     General: Bowel sounds are normal. There is no distension or abdominal bruit.     Palpations: Abdomen is soft. There is no hepatomegaly, splenomegaly, mass or pulsatile mass.     Tenderness: There is no abdominal tenderness.  Musculoskeletal:        General: Normal range of motion.     Cervical back: Normal range of motion and neck supple.  Lymphadenopathy:     Cervical: No cervical adenopathy.  Skin:    General: Skin is warm and dry.     Comments: Scabbed over lesions in linear pattern right posterior bra line  Neurological:     Mental Status: She is alert and oriented to person, place, and time.     Deep Tendon Reflexes: Reflexes are normal and symmetric.  Psychiatric:        Behavior: Behavior normal.        Thought Content: Thought content normal.        Judgment: Judgment normal.    BP 134/72   Pulse 72   Temp 97.9 F (36.6 C) (Temporal)   Resp 20   Ht 4' 11" (1.499 m)   Wt 164 lb (74.4 kg)   SpO2 97%   BMI 33.12 kg/m        Assessment & Plan:  Bonnie Tanner comes in today with chief complaint of Medical Management of Chronic Issues   Diagnosis and orders addressed:  1. Coronary artery disease due to lipid rich plaque  - verapamil (CALAN) 120 MG tablet; Take 1 tablet (120 mg total) by mouth daily.  Dispense: 90 tablet; Refill: 1  2. Gastroesophageal reflux  disease without esophagitis Avoid spicy foods Do not eat 2 hours prior to bedtime - omeprazole (PRILOSEC) 40 MG capsule; Take 1 capsule (40 mg total) by mouth daily.  Dispense: 90 capsule; Refill: 1  3. Essential hypertension, benign Low sodium diet - lisinopril (ZESTRIL) 40 MG tablet; Take 1 tablet (40 mg total) by mouth daily.  Dispense: 90 tablet; Refill: 1 - CBC with Differential/Platelet - CMP14+EGFR  4. Acquired hypothyroidism Labs pending - levothyroxine (SYNTHROID) 88 MCG tablet; Take 1 tablet (88 mcg total) by mouth daily before breakfast.  Dispense: 90 tablet; Refill: 1 - Thyroid Panel With TSH  5. Hyperlipidemia LDL goal <100 Low fat diet - atorvastatin (LIPITOR) 40 MG tablet; Take 1 tablet (40 mg total) by mouth daily.  Dispense: 90 tablet; Refill: 1 - Lipid panel  6. Need for immunization against influenza - Flu Vaccine QUAD High Dose(Fluad)  7. Malignant neoplasm of right breast in female, estrogen receptor positive, unspecified site of breast ([place]) Continue yearly mammograms  8. BMI 33.0-33.9,adult Discussed diet and exercise for person with BMI >25 Will recheck weight in 3-6 months   9. Herpes zoster without complication Has been 3 weeks- to late to treat Symptomatic treatment   Labs pending Health Maintenance reviewed Diet and exercise encouraged  Follow up plan: 6 months   Show Low, FNP

## 2021-06-30 ENCOUNTER — Ambulatory Visit: Payer: Self-pay | Admitting: Nurse Practitioner

## 2021-07-04 ENCOUNTER — Ambulatory Visit: Payer: Medicare Other | Admitting: Nurse Practitioner

## 2021-07-04 ENCOUNTER — Ambulatory Visit: Payer: Self-pay | Admitting: Nurse Practitioner

## 2021-11-12 ENCOUNTER — Other Ambulatory Visit: Payer: Self-pay | Admitting: Nurse Practitioner

## 2021-11-12 DIAGNOSIS — I10 Essential (primary) hypertension: Secondary | ICD-10-CM

## 2021-11-12 DIAGNOSIS — E039 Hypothyroidism, unspecified: Secondary | ICD-10-CM

## 2021-11-12 DIAGNOSIS — I2510 Atherosclerotic heart disease of native coronary artery without angina pectoris: Secondary | ICD-10-CM

## 2021-11-12 DIAGNOSIS — K219 Gastro-esophageal reflux disease without esophagitis: Secondary | ICD-10-CM

## 2021-11-12 DIAGNOSIS — I2583 Coronary atherosclerosis due to lipid rich plaque: Secondary | ICD-10-CM

## 2021-12-26 ENCOUNTER — Ambulatory Visit: Payer: Medicare Other | Admitting: Nurse Practitioner

## 2022-01-02 ENCOUNTER — Encounter: Payer: Self-pay | Admitting: Nurse Practitioner

## 2022-01-02 ENCOUNTER — Ambulatory Visit (INDEPENDENT_AMBULATORY_CARE_PROVIDER_SITE_OTHER): Payer: Medicare Other | Admitting: Nurse Practitioner

## 2022-01-02 VITALS — BP 134/82 | HR 70 | Temp 98.3°F | Resp 20 | Ht 59.0 in | Wt 158.0 lb

## 2022-01-02 DIAGNOSIS — E785 Hyperlipidemia, unspecified: Secondary | ICD-10-CM | POA: Diagnosis not present

## 2022-01-02 DIAGNOSIS — I2510 Atherosclerotic heart disease of native coronary artery without angina pectoris: Secondary | ICD-10-CM | POA: Diagnosis not present

## 2022-01-02 DIAGNOSIS — Z6832 Body mass index (BMI) 32.0-32.9, adult: Secondary | ICD-10-CM

## 2022-01-02 DIAGNOSIS — E039 Hypothyroidism, unspecified: Secondary | ICD-10-CM

## 2022-01-02 DIAGNOSIS — K219 Gastro-esophageal reflux disease without esophagitis: Secondary | ICD-10-CM

## 2022-01-02 DIAGNOSIS — Z23 Encounter for immunization: Secondary | ICD-10-CM

## 2022-01-02 DIAGNOSIS — I10 Essential (primary) hypertension: Secondary | ICD-10-CM | POA: Diagnosis not present

## 2022-01-02 DIAGNOSIS — I2583 Coronary atherosclerosis due to lipid rich plaque: Secondary | ICD-10-CM

## 2022-01-02 MED ORDER — ATORVASTATIN CALCIUM 40 MG PO TABS: 40.0000 mg | ORAL_TABLET | Freq: Every day | ORAL | 1 refills | Status: DC

## 2022-01-02 MED ORDER — VERAPAMIL HCL 120 MG PO TABS: 120.0000 mg | ORAL_TABLET | Freq: Every day | ORAL | 1 refills | Status: DC

## 2022-01-02 MED ORDER — LISINOPRIL 40 MG PO TABS: 40.0000 mg | ORAL_TABLET | Freq: Every day | ORAL | 1 refills | Status: DC

## 2022-01-02 MED ORDER — LEVOTHYROXINE SODIUM 88 MCG PO TABS: 88.0000 ug | ORAL_TABLET | Freq: Every day | ORAL | 1 refills | Status: DC

## 2022-01-02 MED ORDER — OMEPRAZOLE 40 MG PO CPDR: 40.0000 mg | DELAYED_RELEASE_CAPSULE | Freq: Every day | ORAL | 1 refills | Status: DC

## 2022-01-02 NOTE — Addendum Note (Signed)
Addended by: Rolena Infante on: 01/02/2022 04:34 PM ? ? Modules accepted: Orders ? ?

## 2022-01-02 NOTE — Patient Instructions (Signed)

## 2022-01-02 NOTE — Progress Notes (Signed)
? ?Subjective:  ? ? Patient ID: Bonnie Tanner, female    DOB: Feb 14, 1941, 81 y.o.   MRN: [id number] ? ? ? ?Chief Complaint: Medical Management of Chronic Issues ?  ? ?HPI: ? ?Bonnie Tanner is a 81 y.o. who identifies as a female who was assigned female at birth.  ? 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 ? ?Comes in today for follow up of the following chronic medical issues: ? ?1. Essential hypertension, benign ?No c/o chest pan, sob or headache. Does not check blood pressure at home. ?BP Readings from Last 3 Encounters:  ?01/02/22 (!) 148/78  ?06/27/21 134/72  ?04/15/21 130/62  ? ? ? ?2. Hyperlipidemia LDL goal <100 ?Does not watch diet and does little to no dedicated exercise. ?Lab Results  ?Component Value Date  ? CHOL 165 12/27/2020  ? HDL 58 12/27/2020  ? [place] 82 12/27/2020  ? TRIG 147 12/27/2020  ? CHOLHDL 2.8 12/27/2020  ? ?The 10-year ASCVD risk score (Arnett DK, et al., 2019) is: 36.3% ? ? ?3. Acquired hypothyroidism ?No problems that she is aware of. ?Lab Results  ?Component Value Date  ? TSH 4.000 12/27/2020  ? ? ? ?4. Coronary artery disease due to lipid rich plaque ?Has not seen cardiology in several years ? ?5. Gastroesophageal reflux disease without esophagitis ?Is on omprazole daily and is doing well. ? ?6. BMI 32.0-32.9,adult ?Weight is down 6 lbs ?Wt Readings from Last 3 Encounters:  ?01/02/22 158 lb (71.7 kg)  ?06/27/21 164 lb (74.4 kg)  ?04/15/21 162 lb (73.5 kg)  ? ?BMI Readings from Last 3 Encounters:  ?01/02/22 31.91 kg/m?  ?06/27/21 33.12 kg/m?  ?04/15/21 32.72 kg/m?  ? ? ? ?New complaints: ?None  ? ?Allergies  ?Allergen Reactions  ? Erythromycin Swelling  ? Latex Other (See Comments)  ?  Skin gets real red  ? Nickel   ?  Other reaction(s): Other (See Comments) ?blisters  ? Penicillins Hives  ? Sulfonamide Derivatives Rash  ? ?Outpatient Encounter Medications as of 01/02/2022  ?Medication Sig  ? aspirin 325 MG tablet Take 325 mg by mouth  daily.  ? atorvastatin (LIPITOR) 40 MG tablet Take 1 tablet (40 mg total) by mouth daily.  ? cholecalciferol (VITAMIN D) 1000 UNITS tablet Take 1,000 Units by mouth daily.  ? Glucosamine-Chondroit-Vit C-Mn (GLUCOSAMINE 1500 COMPLEX PO) Take by mouth.  ? levothyroxine (SYNTHROID) 88 MCG tablet TAKE 1 TABLET BY MOUTH  DAILY BEFORE BREAKFAST  ? lisinopril (ZESTRIL) 40 MG tablet TAKE 1 TABLET BY MOUTH  DAILY  ? multivitamin-iron-minerals-folic acid (CENTRUM) chewable tablet Chew 1 tablet by mouth daily.  ? nystatin cream (MYCOSTATIN) Apply 1 application topically 2 (two) times daily.  ? omeprazole (PRILOSEC) 40 MG capsule TAKE 1 CAPSULE BY MOUTH  DAILY  ? verapamil (CALAN) 120 MG tablet TAKE 1 TABLET BY MOUTH  DAILY  ? ?No facility-administered encounter medications on file as of 01/02/2022.  ? ? ?Past Surgical History:  ?Procedure Laterality Date  ? BREAST LUMPECTOMY  04/19/2010  ? radiation therapy right breast  ? CARDIAC CATHETERIZATION  09/2009  ? CATARACT EXTRACTION  2006  ? bilateral  ? EYE SURGERY  2000  ? made her ducts  ? ? ?Family History  ?Problem Relation Age of Onset  ? Heart disease Mother 65  ? Emphysema Father   ? Heart disease Father   ? Throat cancer Brother   ? Stroke Brother   ? Healthy Son   ?  Prostate cancer Paternal Uncle   ? Stomach cancer Paternal Uncle   ? ? ? ? ?Controlled substance contract: n/a ? ? ? ?Review of Systems  ?Constitutional:  Negative for diaphoresis.  ?Eyes:  Negative for pain.  ?Respiratory:  Negative for shortness of breath.   ?Cardiovascular:  Negative for chest pain, palpitations and leg swelling.  ?Gastrointestinal:  Negative for abdominal pain.  ?Endocrine: Negative for polydipsia.  ?Skin:  Negative for rash.  ?Neurological:  Negative for dizziness, weakness and headaches.  ?Hematological:  Does not bruise/bleed easily.  ?All other systems reviewed and are negative. ? ?   ?Objective:  ? Physical Exam ?Vitals and nursing note reviewed.  ?Constitutional:   ?   General: She is  not in acute distress. ?   Appearance: Normal appearance. She is well-developed.  ?HENT:  ?   Head: Normocephalic.  ?   Right Ear: Tympanic membrane normal.  ?   Left Ear: Tympanic membrane normal.  ?   Nose: Nose normal.  ?   Mouth/Throat:  ?   Mouth: Mucous membranes are moist.  ?Eyes:  ?   Pupils: Pupils are equal, round, and reactive to light.  ?Neck:  ?   Vascular: No carotid bruit or JVD.  ?Cardiovascular:  ?   Rate and Rhythm: Normal rate and regular rhythm.  ?   Heart sounds: Normal heart sounds.  ?Pulmonary:  ?   Effort: Pulmonary effort is normal. No respiratory distress.  ?   Breath sounds: Normal breath sounds. No wheezing or rales.  ?Chest:  ?   Chest wall: No tenderness.  ?Abdominal:  ?   General: Bowel sounds are normal. There is no distension or abdominal bruit.  ?   Palpations: Abdomen is soft. There is no hepatomegaly, splenomegaly, mass or pulsatile mass.  ?   Tenderness: There is no abdominal tenderness.  ?Musculoskeletal:     ?   General: Normal range of motion.  ?   Cervical back: Normal range of motion and neck supple.  ?Lymphadenopathy:  ?   Cervical: No cervical adenopathy.  ?Skin: ?   General: Skin is warm and dry.  ?Neurological:  ?   Mental Status: She is alert and oriented to person, place, and time.  ?   Deep Tendon Reflexes: Reflexes are normal and symmetric.  ?Psychiatric:     ?   Behavior: Behavior normal.     ?   Thought Content: Thought content normal.     ?   Judgment: Judgment normal.  ? ? ?BP (!) 148/78   Pulse 70   Temp 98.3 ?F (36.8 ?C) (Temporal)   Resp 20   Ht '4\' 11"'  (1.499 m)   Wt 158 lb (71.7 kg)   SpO2 95%   BMI 31.91 kg/m?  ? ? ? ?   ?Assessment & Plan:  ?Bonnie Tanner comes in today with chief complaint of Medical Management of Chronic Issues ? ? ?Diagnosis and orders addressed: ? ?1. Essential hypertension, benign ?Low sodium diet ?- lisinopril (ZESTRIL) 40 MG tablet; Take 1 tablet (40 mg total) by mouth daily.  Dispense: 90 tablet; Refill: 1 ?- CBC with  Differential/Platelet ?- CMP14+EGFR ? ?2. Hyperlipidemia LDL goal <100 ?Low fat diet ?- atorvastatin (LIPITOR) 40 MG tablet; Take 1 tablet (40 mg total) by mouth daily.  Dispense: 90 tablet; Refill: 1 ?- Lipid panel ? ?3. Acquired hypothyroidism ?Labs pending ?- levothyroxine (SYNTHROID) 88 MCG tablet; Take 1 tablet (88 mcg total) by mouth daily before breakfast.  Dispense: 90 tablet; Refill: 1 ?- Thyroid Panel With TSH ? ?4. Coronary artery disease due to lipid rich plaque ?- verapamil (CALAN) 120 MG tablet; Take 1 tablet (120 mg total) by mouth daily.  Dispense: 90 tablet; Refill: 1 ? ?5. Gastroesophageal reflux disease without esophagitis ?Avoid spicy foods ?Do not eat 2 hours prior to bedtime ?- omeprazole (PRILOSEC) 40 MG capsule; Take 1 capsule (40 mg total) by mouth daily.  Dispense: 90 capsule; Refill: 1 ? ?6. BMI 32.0-32.9,adult ?Discussed diet and exercise for person with BMI >25 ?Will recheck weight in 3-6 months ? ? ? ?Labs pending ?Health Maintenance reviewed ?Diet and exercise encouraged ? ?Follow up plan: ?6 months ? ? ?[name], FNP ? ? ?

## 2022-01-03 LAB — CMP14+EGFR
ALT: 40 IU/L — ABNORMAL HIGH (ref 0–32)
AST: 39 IU/L (ref 0–40)
Albumin/Globulin Ratio: 1.5 (ref 1.2–2.2)
Albumin: 4.1 g/dL (ref 3.7–4.7)
Alkaline Phosphatase: 211 IU/L — ABNORMAL HIGH (ref 44–121)
BUN/Creatinine Ratio: 17 (ref 12–28)
BUN: 15 mg/dL (ref 8–27)
Bilirubin Total: 0.4 mg/dL (ref 0.0–1.2)
CO2: 26 mmol/L (ref 20–29)
Calcium: 10.4 mg/dL — ABNORMAL HIGH (ref 8.7–10.3)
Chloride: 102 mmol/L (ref 96–106)
Creatinine, Ser: 0.86 mg/dL (ref 0.57–1.00)
Globulin, Total: 2.8 g/dL (ref 1.5–4.5)
Glucose: 91 mg/dL (ref 70–99)
Potassium: 4.3 mmol/L (ref 3.5–5.2)
Sodium: 141 mmol/L (ref 134–144)
Total Protein: 6.9 g/dL (ref 6.0–8.5)
eGFR: 69 mL/min/{1.73_m2} (ref >59)

## 2022-01-03 LAB — CBC WITH DIFFERENTIAL/PLATELET
Basophils Absolute: 0.1 10*3/uL (ref 0.0–0.2)
Basos: 1 %
EOS (ABSOLUTE): 0.5 10*3/uL — ABNORMAL HIGH (ref 0.0–0.4)
Eos: 5 %
Hematocrit: 40.5 % (ref 34.0–46.6)
Hemoglobin: 13.7 g/dL (ref 11.1–15.9)
Immature Grans (Abs): 0 10*3/uL (ref 0.0–0.1)
Immature Granulocytes: 0 %
Lymphocytes Absolute: 3 10*3/uL (ref 0.7–3.1)
Lymphs: 31 %
MCH: 33.3 pg — ABNORMAL HIGH (ref 26.6–33.0)
MCHC: 33.8 g/dL (ref 31.5–35.7)
MCV: 99 fL — ABNORMAL HIGH (ref 79–97)
Monocytes Absolute: 0.6 10*3/uL (ref 0.1–0.9)
Monocytes: 6 %
Neutrophils Absolute: 5.4 10*3/uL (ref 1.4–7.0)
Neutrophils: 57 %
Platelets: 270 10*3/uL (ref 150–450)
RBC: 4.11 x10E6/uL (ref 3.77–5.28)
RDW: 11.9 % (ref 11.7–15.4)
WBC: 9.6 10*3/uL (ref 3.4–10.8)

## 2022-01-03 LAB — LIPID PANEL
Chol/HDL Ratio: 2.8 ratio (ref 0.0–4.4)
Cholesterol, Total: 165 mg/dL (ref 100–199)
HDL: 58 mg/dL (ref >39)
LDL Chol Calc (NIH): 80 mg/dL (ref 0–99)
Triglycerides: 158 mg/dL — ABNORMAL HIGH (ref 0–149)
VLDL Cholesterol Cal: 27 mg/dL (ref 5–40)

## 2022-01-03 LAB — THYROID PANEL WITH TSH
Free Thyroxine Index: 2.5 (ref 1.2–4.9)
T3 Uptake Ratio: 26 % (ref 24–39)
T4, Total: 9.5 ug/dL (ref 4.5–12.0)
TSH: 2.48 u[IU]/mL (ref 0.450–4.500)

## 2022-01-04 ENCOUNTER — Telehealth: Payer: Self-pay | Admitting: *Deleted

## 2022-01-04 NOTE — Telephone Encounter (Signed)
Pt called and stated that she got a shingles vaccine on MON and now the area is sore, red and swollen = about the size of a 50 cent pc.  ? ?She is aware that she should use benadryl and tylenol and could also try benadryl cream if she has it.  ? ?Aware to call back if it worsens ?Aware to speak with MMM about this before getting her #2 shingles shot. ? ?

## 2022-03-30 ENCOUNTER — Ambulatory Visit: Payer: Medicare Other

## 2022-05-18 ENCOUNTER — Telehealth: Payer: Self-pay | Admitting: Nurse Practitioner

## 2022-05-18 NOTE — Telephone Encounter (Signed)
Left message for patient to call back and schedule Medicare Annual Wellness Visit (AWV) to be completed by video or phone.   Last AWV:  03/29/2021  Please schedule at anytime with [facility] [place]     45 minute appointment  Any questions, please contact me at [phone]

## 2022-05-28 ENCOUNTER — Other Ambulatory Visit: Payer: Self-pay | Admitting: Nurse Practitioner

## 2022-05-28 DIAGNOSIS — K219 Gastro-esophageal reflux disease without esophagitis: Secondary | ICD-10-CM

## 2022-05-28 DIAGNOSIS — E785 Hyperlipidemia, unspecified: Secondary | ICD-10-CM

## 2022-05-28 DIAGNOSIS — I10 Essential (primary) hypertension: Secondary | ICD-10-CM

## 2022-05-28 DIAGNOSIS — I2510 Atherosclerotic heart disease of native coronary artery without angina pectoris: Secondary | ICD-10-CM

## 2022-05-28 DIAGNOSIS — E039 Hypothyroidism, unspecified: Secondary | ICD-10-CM

## 2022-07-10 ENCOUNTER — Encounter: Payer: Self-pay | Admitting: Nurse Practitioner

## 2022-07-10 ENCOUNTER — Ambulatory Visit (INDEPENDENT_AMBULATORY_CARE_PROVIDER_SITE_OTHER): Payer: Medicare Other | Admitting: Nurse Practitioner

## 2022-07-10 VITALS — BP 144/66 | HR 76 | Temp 98.2°F | Resp 20 | Ht 59.0 in | Wt 155.0 lb

## 2022-07-10 DIAGNOSIS — E785 Hyperlipidemia, unspecified: Secondary | ICD-10-CM | POA: Diagnosis not present

## 2022-07-10 DIAGNOSIS — I2583 Coronary atherosclerosis due to lipid rich plaque: Secondary | ICD-10-CM | POA: Diagnosis not present

## 2022-07-10 DIAGNOSIS — E039 Hypothyroidism, unspecified: Secondary | ICD-10-CM | POA: Diagnosis not present

## 2022-07-10 DIAGNOSIS — C50911 Malignant neoplasm of unspecified site of right female breast: Secondary | ICD-10-CM | POA: Diagnosis not present

## 2022-07-10 DIAGNOSIS — Z170 Estrogen receptor positive status [ER+]: Secondary | ICD-10-CM | POA: Diagnosis not present

## 2022-07-10 DIAGNOSIS — I10 Essential (primary) hypertension: Secondary | ICD-10-CM | POA: Diagnosis not present

## 2022-07-10 DIAGNOSIS — Z23 Encounter for immunization: Secondary | ICD-10-CM | POA: Diagnosis not present

## 2022-07-10 DIAGNOSIS — I2510 Atherosclerotic heart disease of native coronary artery without angina pectoris: Secondary | ICD-10-CM

## 2022-07-10 DIAGNOSIS — K219 Gastro-esophageal reflux disease without esophagitis: Secondary | ICD-10-CM | POA: Diagnosis not present

## 2022-07-10 DIAGNOSIS — Z6832 Body mass index (BMI) 32.0-32.9, adult: Secondary | ICD-10-CM

## 2022-07-10 MED ORDER — OMEPRAZOLE 40 MG PO CPDR: 40.0000 mg | DELAYED_RELEASE_CAPSULE | Freq: Every day | ORAL | 1 refills | Status: DC

## 2022-07-10 MED ORDER — LEVOTHYROXINE SODIUM 88 MCG PO TABS: 88.0000 ug | ORAL_TABLET | Freq: Every day | ORAL | 1 refills | Status: DC

## 2022-07-10 MED ORDER — VERAPAMIL HCL 120 MG PO TABS: 120.0000 mg | ORAL_TABLET | Freq: Every day | ORAL | 1 refills | Status: DC

## 2022-07-10 MED ORDER — LISINOPRIL 40 MG PO TABS: 40.0000 mg | ORAL_TABLET | Freq: Every day | ORAL | 1 refills | Status: DC

## 2022-07-10 MED ORDER — ATORVASTATIN CALCIUM 40 MG PO TABS: 40.0000 mg | ORAL_TABLET | Freq: Every day | ORAL | 1 refills | Status: DC

## 2022-07-10 NOTE — Progress Notes (Signed)
Subjective:    Patient ID: Bonnie Tanner, female    DOB: [birth year], 81 y.o.   MRN: [id number]   Chief Complaint: medical management of chronic issues.   HPI:  Bonnie Tanner is a 81 y.o. who identifies as a female who was assigned female at birth.   Social history: Lives with: husband Work history: Emergency planning/management officer   Comes in today for follow up of the following chronic medical issues:  1. Essential hypertension, benign No c/o chest pain, sob or headache. Does occasionally check BP at home. BP Readings from Last 3 Encounters:  07/10/22 (!) 144/66  01/02/22 134/82  06/27/21 134/72     2. Hyperlipidemia LDL goal <100 Does not watch diet and no dedicated exercise. Lab Results  Component Value Date   CHOL 165 01/02/2022   HDL 58 01/02/2022   LDLCALC 80 01/02/2022   TRIG 158 (H) 01/02/2022   CHOLHDL 2.8 01/02/2022   The 10-year ASCVD risk score (Arnett DK, et al., 2019) is: 30.9%   3. Coronary artery disease due to lipid rich plaque Has not seen cardiology in several years.  4. Acquired hypothyroidism No problems she is aware of. Lab Results  Component Value Date   TSH 2.480 01/02/2022     5. Gastroesophageal reflux disease without esophagitis Doing well on daily omeprazole.  6. Malignant neoplasm of right breast in female, estrogen receptor positive, unspecified site of breast ([place]) Last mammogram was 05/09/21; benign.  7. BMI 32.0-32.9,adult Weight is down 3 lbs since last check. Wt Readings from Last 3 Encounters:  07/10/22 155 lb (70.3 kg)  01/02/22 158 lb (71.7 kg)  06/27/21 164 lb (74.4 kg)    BMI Readings from Last 3 Encounters:  07/10/22 31.31 kg/m  01/02/22 31.91 kg/m  06/27/21 33.12 kg/m      New complaints: None today.  Allergies  Allergen Reactions   Erythromycin Swelling   Latex Other (See Comments)    Skin gets real red   Nickel     Other reaction(s): Other (See Comments) blisters   Penicillins Hives   Sulfonamide  Derivatives Rash   Outpatient Encounter Medications as of 07/10/2022  Medication Sig   aspirin 325 MG tablet Take 325 mg by mouth daily.   atorvastatin (LIPITOR) 40 MG tablet TAKE 1 TABLET BY MOUTH DAILY   cholecalciferol (VITAMIN D) 1000 UNITS tablet Take 1,000 Units by mouth daily.   Glucosamine-Chondroit-Vit C-Mn (GLUCOSAMINE 1500 COMPLEX PO) Take by mouth.   levothyroxine (SYNTHROID) 88 MCG tablet TAKE 1 TABLET BY MOUTH DAILY  BEFORE BREAKFAST   lisinopril (ZESTRIL) 40 MG tablet TAKE 1 TABLET BY MOUTH DAILY   multivitamin-iron-minerals-folic acid (CENTRUM) chewable tablet Chew 1 tablet by mouth daily.   nystatin cream (MYCOSTATIN) Apply 1 application topically 2 (two) times daily.   omeprazole (PRILOSEC) 40 MG capsule TAKE 1 CAPSULE BY MOUTH DAILY   verapamil (CALAN) 120 MG tablet TAKE 1 TABLET BY MOUTH DAILY   No facility-administered encounter medications on file as of 07/10/2022.    Past Surgical History:  Procedure Laterality Date   BREAST LUMPECTOMY  04/19/2010   radiation therapy right breast   CARDIAC CATHETERIZATION  09/2009   CATARACT EXTRACTION  2006   bilateral   EYE SURGERY  2000   made her ducts    Family History  Problem Relation Age of Onset   Heart disease Mother 49   Emphysema Father    Heart disease Father    Throat cancer Brother    Stroke  Brother    Healthy Son    Prostate cancer Paternal Uncle    Stomach cancer Paternal Uncle       Controlled substance contract: n/a     Review of Systems  Constitutional:  Negative for diaphoresis.  Eyes:  Negative for pain.  Respiratory:  Negative for chest tightness and shortness of breath.   Cardiovascular:  Negative for chest pain, palpitations and leg swelling.  Gastrointestinal:  Negative for abdominal pain.  Endocrine: Negative for polydipsia.  Neurological:  Negative for dizziness, weakness and headaches.  Hematological:  Does not bruise/bleed easily.  All other systems reviewed and are  negative.      Objective:   Physical Exam Vitals and nursing note reviewed.  Constitutional:      General: She is not in acute distress.    Appearance: Normal appearance. She is well-developed.  HENT:     Head: Normocephalic and atraumatic.     Right Ear: Tympanic membrane normal.     Left Ear: Tympanic membrane normal.     Nose: Nose normal.     Mouth/Throat:     Mouth: Mucous membranes are moist.     Pharynx: Oropharynx is clear. Uvula midline.  Eyes:     Conjunctiva/sclera: Conjunctivae normal.     Pupils: Pupils are equal, round, and reactive to light.  Neck:     Thyroid: No thyroid mass or thyromegaly.     Vascular: No carotid bruit.  Cardiovascular:     Rate and Rhythm: Normal rate and regular rhythm.     Pulses: Normal pulses.     Heart sounds: Normal heart sounds.  Pulmonary:     Effort: Pulmonary effort is normal.     Breath sounds: Normal breath sounds. No wheezing, rhonchi or rales.  Chest:     Chest wall: No tenderness.  Abdominal:     General: Bowel sounds are normal.     Palpations: Abdomen is soft.     Tenderness: There is no abdominal tenderness.  Musculoskeletal:        General: Normal range of motion.     Cervical back: Normal range of motion.  Lymphadenopathy:     Cervical: No cervical adenopathy.  Skin:    General: Skin is warm and dry.     Capillary Refill: Capillary refill takes less than 2 seconds.     Findings: No rash.  Neurological:     Mental Status: She is alert and oriented to person, place, and time.  Psychiatric:        Mood and Affect: Mood normal.        Behavior: Behavior normal.        Thought Content: Thought content normal.        Judgment: Judgment normal.   BP (!) 144/66   Pulse 76   Temp 98.2 F (36.8 C) (Temporal)   Resp 20   Ht '4\' 11"'  (1.499 m)   Wt 155 lb (70.3 kg)   SpO2 98%   BMI 31.31 kg/m          Assessment & Plan:  Bonnie Tanner comes in today with chief complaint of Medical Management of Chronic  Issues   Diagnosis and orders addressed:  1. Essential hypertension, benign Low sodium diet. - lisinopril (ZESTRIL) 40 MG tablet; Take 1 tablet (40 mg total) by mouth daily.  Dispense: 90 tablet; Refill: 1 - CBC with Differential/Platelet - CMP14+EGFR  2. Hyperlipidemia LDL goal <100 Low fat diet. - atorvastatin (LIPITOR) 40 MG tablet;  Take 1 tablet (40 mg total) by mouth daily.  Dispense: 90 tablet; Refill: 1 - Lipid panel  3. Coronary artery disease due to lipid rich plaque - verapamil (CALAN) 120 MG tablet; Take 1 tablet (120 mg total) by mouth daily.  Dispense: 90 tablet; Refill: 1  4. Acquired hypothyroidism Labs pending. - levothyroxine (SYNTHROID) 88 MCG tablet; Take 1 tablet (88 mcg total) by mouth daily before breakfast.  Dispense: 90 tablet; Refill: 1  5. Gastroesophageal reflux disease without esophagitis Avoid spicy foods. Avoid eating 2 hours before bedtime. - omeprazole (PRILOSEC) 40 MG capsule; Take 1 capsule (40 mg total) by mouth daily.  Dispense: 90 capsule; Refill: 1  6. Malignant neoplasm of right breast in female, estrogen receptor positive, unspecified site of breast ([place]) Keep scheduled mammograms.  7. BMI 32.0-32.9,adult Discussed diet and exercise for person with BMI> 25. Will recheck weight in 3-6 months.   Labs pending Health Maintenance reviewed Diet and exercise encouraged  Follow up plan: 6 months  Collene Leyden, FNP student   [place], [place]

## 2022-07-10 NOTE — Patient Instructions (Signed)

## 2022-07-11 LAB — CBC WITH DIFFERENTIAL/PLATELET
Basophils Absolute: 0 10*3/uL (ref 0.0–0.2)
Basos: 1 %
EOS (ABSOLUTE): 0.3 10*3/uL (ref 0.0–0.4)
Eos: 5 %
Hematocrit: 38.8 % (ref 34.0–46.6)
Hemoglobin: 12.7 g/dL (ref 11.1–15.9)
Immature Grans (Abs): 0 10*3/uL (ref 0.0–0.1)
Immature Granulocytes: 0 %
Lymphocytes Absolute: 2.4 10*3/uL (ref 0.7–3.1)
Lymphs: 39 %
MCH: 33.3 pg — ABNORMAL HIGH (ref 26.6–33.0)
MCHC: 32.7 g/dL (ref 31.5–35.7)
MCV: 102 fL — ABNORMAL HIGH (ref 79–97)
Monocytes Absolute: 0.4 10*3/uL (ref 0.1–0.9)
Monocytes: 7 %
Neutrophils Absolute: 2.9 10*3/uL (ref 1.4–7.0)
Neutrophils: 48 %
Platelets: 232 10*3/uL (ref 150–450)
RBC: 3.81 x10E6/uL (ref 3.77–5.28)
RDW: 12.3 % (ref 11.7–15.4)
WBC: 6 10*3/uL (ref 3.4–10.8)

## 2022-07-11 LAB — LIPID PANEL
Chol/HDL Ratio: 2.3 ratio (ref 0.0–4.4)
Cholesterol, Total: 152 mg/dL (ref 100–199)
HDL: 66 mg/dL (ref >39)
LDL Chol Calc (NIH): 74 mg/dL (ref 0–99)
Triglycerides: 55 mg/dL (ref 0–149)
VLDL Cholesterol Cal: 12 mg/dL (ref 5–40)

## 2022-07-11 LAB — CMP14+EGFR
ALT: 27 IU/L (ref 0–32)
AST: 29 IU/L (ref 0–40)
Albumin/Globulin Ratio: 1.9 (ref 1.2–2.2)
Albumin: 4.4 g/dL (ref 3.8–4.8)
Alkaline Phosphatase: 166 IU/L — ABNORMAL HIGH (ref 44–121)
BUN/Creatinine Ratio: 23 (ref 12–28)
BUN: 18 mg/dL (ref 8–27)
Bilirubin Total: 0.4 mg/dL (ref 0.0–1.2)
CO2: 22 mmol/L (ref 20–29)
Calcium: 10.6 mg/dL — ABNORMAL HIGH (ref 8.7–10.3)
Chloride: 104 mmol/L (ref 96–106)
Creatinine, Ser: 0.8 mg/dL (ref 0.57–1.00)
Globulin, Total: 2.3 g/dL (ref 1.5–4.5)
Glucose: 88 mg/dL (ref 70–99)
Potassium: 4.2 mmol/L (ref 3.5–5.2)
Sodium: 142 mmol/L (ref 134–144)
Total Protein: 6.7 g/dL (ref 6.0–8.5)
eGFR: 75 mL/min/{1.73_m2} (ref >59)

## 2022-07-27 ENCOUNTER — Emergency Department (HOSPITAL_COMMUNITY)
Admission: EM | Admit: 2022-07-27 | Discharge: 2022-07-27 | Disposition: A | Discharge status: Home / Self Care | Payer: Medicare Other | Attending: Emergency Medicine | Admitting: Emergency Medicine

## 2022-07-27 ENCOUNTER — Other Ambulatory Visit: Payer: Self-pay

## 2022-07-27 ENCOUNTER — Emergency Department (HOSPITAL_COMMUNITY): Payer: Medicare Other

## 2022-07-27 ENCOUNTER — Encounter (HOSPITAL_COMMUNITY): Payer: Self-pay

## 2022-07-27 DIAGNOSIS — W109XXA Fall (on) (from) unspecified stairs and steps, initial encounter: Secondary | ICD-10-CM | POA: Insufficient documentation

## 2022-07-27 DIAGNOSIS — M79641 Pain in right hand: Secondary | ICD-10-CM | POA: Diagnosis not present

## 2022-07-27 DIAGNOSIS — W19XXXA Unspecified fall, initial encounter: Secondary | ICD-10-CM

## 2022-07-27 DIAGNOSIS — Z91040 Latex allergy status: Secondary | ICD-10-CM | POA: Insufficient documentation

## 2022-07-27 DIAGNOSIS — Z859 Personal history of malignant neoplasm, unspecified: Secondary | ICD-10-CM | POA: Diagnosis not present

## 2022-07-27 DIAGNOSIS — S0101XA Laceration without foreign body of scalp, initial encounter: Secondary | ICD-10-CM

## 2022-07-27 DIAGNOSIS — I10 Essential (primary) hypertension: Secondary | ICD-10-CM | POA: Diagnosis not present

## 2022-07-27 DIAGNOSIS — I2510 Atherosclerotic heart disease of native coronary artery without angina pectoris: Secondary | ICD-10-CM | POA: Diagnosis not present

## 2022-07-27 DIAGNOSIS — Z23 Encounter for immunization: Secondary | ICD-10-CM | POA: Insufficient documentation

## 2022-07-27 DIAGNOSIS — E039 Hypothyroidism, unspecified: Secondary | ICD-10-CM | POA: Insufficient documentation

## 2022-07-27 DIAGNOSIS — Z7982 Long term (current) use of aspirin: Secondary | ICD-10-CM | POA: Insufficient documentation

## 2022-07-27 DIAGNOSIS — Z79899 Other long term (current) drug therapy: Secondary | ICD-10-CM | POA: Insufficient documentation

## 2022-07-27 DIAGNOSIS — S199XXA Unspecified injury of neck, initial encounter: Secondary | ICD-10-CM | POA: Diagnosis not present

## 2022-07-27 DIAGNOSIS — I6782 Cerebral ischemia: Secondary | ICD-10-CM | POA: Diagnosis not present

## 2022-07-27 MED ORDER — TETANUS-DIPHTH-ACELL PERTUSSIS 5-2.5-18.5 LF-MCG/0.5 IM SUSY
0.5000 mL | PREFILLED_SYRINGE | Freq: Once | INTRAMUSCULAR | Status: AC
  Administered 2022-07-27: 0.5 mL via INTRAMUSCULAR
  Filled 2022-07-27: qty 0.5

## 2022-07-27 NOTE — ED Triage Notes (Signed)
Pt presents to ED after fall down 3-4 steps. Pt tripped and fell. Hit head, take ASA daily. Denies LOC

## 2022-07-27 NOTE — ED Provider Notes (Signed)
[facility] EMERGENCY DEPARTMENT Provider Note   CSN: [id number] Arrival date & time: 07/27/22  1208     History  Chief Complaint  Patient presents with   Bonnie Tanner    Bonnie Tanner is a 81 y.o. female.   Fall   81 year old female presents emergency department after a fall.  Patient states that she tripped down 3-4 steps last night around 8-830 resulting in a fall.  She states that she was carrying a load in her right arm missed a step causing a fall.  She denies loss of consciousness.  She reports she takes aspirin daily but no anticoagulation.  She presents emergency department today because she has had difficulty stopping the bleeding.  She has been cleaning the wound with peroxide since it occurred.  Denies weakness/sensory deficits, slurred speech, facial droop, gait abnormalities.  Patient also endorses right hand pain but denies any MSK pain.  Past medical history significant for malignancy, CAD, GERD, hyperlipidemia, hypertension, hypothyroidism.  Home Medications Prior to Admission medications   Medication Sig Start Date End Date Taking? Authorizing Provider  aspirin 325 MG tablet Take 325 mg by mouth daily.    [provider]  atorvastatin (LIPITOR) 40 MG tablet Take 1 tablet (40 mg total) by mouth daily. 07/10/22   Hassell Done Mary-Margaret, FNP  cholecalciferol (VITAMIN D) 1000 UNITS tablet Take 1,000 Units by mouth daily.    [provider]  Glucosamine-Chondroit-Vit C-Mn (GLUCOSAMINE 1500 COMPLEX PO) Take by mouth.    [provider]  levothyroxine (SYNTHROID) 88 MCG tablet Take 1 tablet (88 mcg total) by mouth daily before breakfast. 07/10/22   Hassell Done, Mary-Margaret, FNP  lisinopril (ZESTRIL) 40 MG tablet Take 1 tablet (40 mg total) by mouth daily. 07/10/22   Chevis Pretty, FNP  multivitamin-iron-minerals-folic acid (CENTRUM) chewable tablet Chew 1 tablet by mouth daily.    [provider]  nystatin cream (MYCOSTATIN) Apply 1  application topically 2 (two) times daily. 04/15/21   Hassell Done, Mary-Margaret, FNP  omeprazole (PRILOSEC) 40 MG capsule Take 1 capsule (40 mg total) by mouth daily. 07/10/22   Hassell Done, Mary-Margaret, FNP  verapamil (CALAN) 120 MG tablet Take 1 tablet (120 mg total) by mouth daily. 07/10/22   Hassell Done Mary-Margaret, FNP      Allergies    Erythromycin, Latex, Nickel, Penicillins, and Sulfonamide derivatives    Review of Systems   Review of Systems  All other systems reviewed and are negative.   Physical Exam Updated Vital Signs BP (!) 148/61 (BP Location: Right Arm)   Pulse 71   Temp 98.1 F (36.7 C) (Oral)   Resp 14   Ht '4\' 11"'$  (1.499 m)   Wt 70.3 kg   SpO2 97%   BMI 31.31 kg/m  Physical Exam Vitals and nursing note reviewed.  Constitutional:      General: She is not in acute distress.    Appearance: She is well-developed.  HENT:     Head: Normocephalic.     Comments: Area of dried blood noted on patient's left scalp.  No active bleeding noted.  Difficult to assess degree of laceration 1.5 to 2 cm linear laceration noted on patient's left crown.  No active bleeding noted.  No evidence of foreign body. Eyes:     Conjunctiva/sclera: Conjunctivae normal.  Cardiovascular:     Rate and Rhythm: Normal rate and regular rhythm.  Pulmonary:     Effort: Pulmonary effort is normal. No respiratory distress.     Breath sounds: Normal breath sounds.  Abdominal:  Palpations: Abdomen is soft.     Tenderness: There is no abdominal tenderness.  Musculoskeletal:        General: No swelling.     Cervical back: Normal range of motion and neck supple. No rigidity or tenderness.     Comments: No tenderness palpation midline of cervical, thoracic, lumbar spine with no obvious deformity.  No anterior posterior wall tenderness.  No tenderness palpation along lower extremities.  No tenderness to palpation of upper extremities.  Patient does have area of ecchymosis noted on the dorsal aspect of patient's  right hand.  Patient able to make okay sign, fist, thumbs up, resist horizontal adduction of digits, extend wrist on the right without difficulty.  Radial and posterior tibialis is full intact bilaterally.  No sensory deficits along major nerve distributions of upper and lower lower extremities.  Skin:    General: Skin is warm and dry.     Capillary Refill: Capillary refill takes less than 2 seconds.  Neurological:     Mental Status: She is alert.     Comments: Alert and oriented to self, place, time and event.   Speech is fluent, clear without dysarthria or dysphasia.   Strength 5/5 in upper/lower extremities   Sensation intact in upper/lower extremities   CN I not tested  CN II not tested CN III, IV, VI PERRLA and EOMs intact bilaterally  CN V Intact sensation to sharp and light touch to the face  CN VII facial movements symmetric  CN VIII not tested  CN IX, X no uvula deviation, symmetric rise of soft palate  CN XI 5/5 SCM and trapezius strength bilaterally  CN XII Midline tongue protrusion, symmetric L/R movements     Psychiatric:        Mood and Affect: Mood normal.     ED Results / Procedures / Treatments   Labs (all labs ordered are listed, but only abnormal results are displayed) Labs Reviewed - No data to display  EKG None  Radiology CT Head Wo Contrast  Result Date: 07/27/2022 CLINICAL DATA:  Trauma/Fall EXAM: CT HEAD WITHOUT CONTRAST CT CERVICAL SPINE WITHOUT CONTRAST TECHNIQUE: Multidetector CT imaging of the head and cervical spine was performed following the standard protocol without intravenous contrast. Multiplanar CT image reconstructions of the cervical spine were also generated. RADIATION DOSE REDUCTION: This exam was performed according to the departmental dose-optimization program which includes automated exposure control, adjustment of the mA and/or kV according to patient size and/or use of iterative reconstruction technique. COMPARISON:  None  Available. FINDINGS: CT HEAD FINDINGS Brain: Sequela of severe chronic microvascular ischemic change with a chronic appearing left lentiform nucleus infarct. No evidence of acute infarction, hemorrhage, hydrocephalus, extra-axial collection or mass lesion/mass effect. There is mild ex vacuo dilatation of the left frontal horn. Vascular: No hyperdense vessel or unexpected calcification. Skull: Normal. Negative for fracture or focal lesion. There is a small soft tissue laceration at the scalp vertex (series 3, image 74). Sinuses/Orbits: Bilateral lens replacement. Other: None CT CERVICAL SPINE FINDINGS Alignment: Normal. Skull base and vertebrae: No acute fracture. No primary bone lesion or focal pathologic process. Soft tissues and spinal canal: No prevertebral fluid or swelling. No visible canal hematoma. Disc levels:  No evidence of high-grade spinal canal stenosis. Upper chest: Negative. Other: None IMPRESSION: 1. Small soft tissue laceration at the scalp vertex. No evidence of calvarial fracture or intracranial injury. 2. No acute cervical spine fracture. 3. Sequela of severe chronic microvascular ischemic change with a chronic  appearing left lentiform nucleus infarct. Electronically Signed   By: Marin Roberts M.D.   On: 07/27/2022 15:56   CT Cervical Spine Wo Contrast  Result Date: 07/27/2022 CLINICAL DATA:  Trauma/Fall EXAM: CT HEAD WITHOUT CONTRAST CT CERVICAL SPINE WITHOUT CONTRAST TECHNIQUE: Multidetector CT imaging of the head and cervical spine was performed following the standard protocol without intravenous contrast. Multiplanar CT image reconstructions of the cervical spine were also generated. RADIATION DOSE REDUCTION: This exam was performed according to the departmental dose-optimization program which includes automated exposure control, adjustment of the mA and/or kV according to patient size and/or use of iterative reconstruction technique. COMPARISON:  None Available. FINDINGS: CT HEAD  FINDINGS Brain: Sequela of severe chronic microvascular ischemic change with a chronic appearing left lentiform nucleus infarct. No evidence of acute infarction, hemorrhage, hydrocephalus, extra-axial collection or mass lesion/mass effect. There is mild ex vacuo dilatation of the left frontal horn. Vascular: No hyperdense vessel or unexpected calcification. Skull: Normal. Negative for fracture or focal lesion. There is a small soft tissue laceration at the scalp vertex (series 3, image 74). Sinuses/Orbits: Bilateral lens replacement. Other: None CT CERVICAL SPINE FINDINGS Alignment: Normal. Skull base and vertebrae: No acute fracture. No primary bone lesion or focal pathologic process. Soft tissues and spinal canal: No prevertebral fluid or swelling. No visible canal hematoma. Disc levels:  No evidence of high-grade spinal canal stenosis. Upper chest: Negative. Other: None IMPRESSION: 1. Small soft tissue laceration at the scalp vertex. No evidence of calvarial fracture or intracranial injury. 2. No acute cervical spine fracture. 3. Sequela of severe chronic microvascular ischemic change with a chronic appearing left lentiform nucleus infarct. Electronically Signed   By: Marin Roberts M.D.   On: 07/27/2022 15:56   DG Hand Complete Right  Result Date: 07/27/2022 CLINICAL DATA:  Chronic right hand pain.  Injury 2 years ago. EXAM: RIGHT HAND - COMPLETE 3 VIEW COMPARISON:  None Available. FINDINGS: Normal alignment no fracture Mild osteoarthritis in the D IP joints and third, fourth, fifth PIP joints. Mild degenerative change base of thumb. IMPRESSION: Osteoarthritis no acute abnormality. Electronically Signed   By: Franchot Gallo M.D.   On: 07/27/2022 15:49    Procedures .Marland KitchenLaceration Repair  Date/Time: 07/27/2022 4:21 PM  Performed by: Wilnette Kales, PA Authorized by: Wilnette Kales, PA   Consent:    Consent obtained:  Verbal   Consent given by:  Patient   Risks, benefits, and alternatives were  discussed: yes     Risks discussed:  Infection, need for additional repair, pain, poor cosmetic result and poor wound healing   Alternatives discussed:  No treatment and delayed treatment Universal protocol:    Procedure explained and questions answered to patient or proxy's satisfaction: yes     Test results available: yes     Imaging studies available: yes     Patient identity confirmed:  Verbally with patient Anesthesia:    Anesthesia method:  None Laceration details:    Location:  Scalp   Scalp location:  Crown   Length (cm):  2 Pre-procedure details:    Preparation:  Imaging obtained to evaluate for foreign bodies and patient was prepped and draped in usual sterile fashion Exploration:    Limited defect created (wound extended): no     Hemostasis achieved with:  Direct pressure   Imaging obtained: x-ray     Imaging outcome: foreign body not noted     Wound exploration: wound explored through full range of motion and entire depth  of wound visualized     Contaminated: no   Treatment:    Area cleansed with:  Saline   Amount of cleaning:  Standard   Irrigation solution:  Sterile saline   Irrigation volume:  200cc   Irrigation method:  Syringe   Visualized foreign bodies/material removed: no     Debridement:  None   Undermining:  None   Scar revision: no   Skin repair:    Repair method:  Staples   Number of staples:  3 Approximation:    Approximation:  Close Post-procedure details:    Dressing:  Open (no dressing)   Procedure completion:  Tolerated well, no immediate complications     Medications Ordered in ED Medications  Tdap (BOOSTRIX) injection 0.5 mL (0.5 mLs Intramuscular Given 07/27/22 1558)    ED Course/ Medical Decision Making/ A&P                           Medical Decision Making Amount and/or Complexity of Data Reviewed Radiology: ordered.  Risk Prescription drug management.   This patient presents to the ED for concern of fall, this involves an  extensive number of treatment options, and is a complaint that carries with it a high risk of complications and morbidity.  The differential diagnosis includes CVA, fracture, strain/sprain, dislocation plan for right retainment   Co morbidities that complicate the patient evaluation  See HPI   Additional history obtained:  Additional history obtained from EMR External records from outside source obtained and reviewed including hospital records   Lab Tests:  N/a   Imaging Studies ordered:  I ordered imaging studies including CT head/C-spine, right hand x-ray I independently visualized and interpreted imaging which showed  CT head/C-spine: Small soft tissue laceration noted to scalp vertex.  No evidence of calvarial fracture or intracranial injury.  No acute cervical spine fracture.  Sequela of severe chronic microischemic changes with chronic appearing left lentiform nucleus infarct. Right hand x-ray: OA with no acute abnormalities. I agree with the radiologist interpretation  Cardiac Monitoring: / EKG:  The patient was maintained on a cardiac monitor.  I personally viewed and interpreted the cardiac monitored which showed an underlying rhythm of: Sinus rhythm   Consultations Obtained:  N/a   Problem List / ED Course / Critical interventions / Medication management  Fall Reevaluation of the patient showed that the patient stayed the same I have reviewed the patients home medicines and have made adjustments as needed   Social Determinants of Health:  Denies tobacco, illicit drug use   Test / Admission - Considered:  Fall/laceration Vitals signs significant for hypertension with blood pressure 148/61.  Recommend close follow-up with PCP regarding elevation of blood pressure.. Otherwise within normal range and stable throughout visit. Imaging studies significant for: See above Patient's laceration repaired while in the emergency department as indicated above.  No  acute findings on imaging studies ordered.  Patient recommended proper laceration care at home with stable removal in 7 to 10 days.  Tetanus was updated.  Treatment plan discussed along with patient and she knowledge understand was agreeable to said plan. Worrisome signs and symptoms were discussed with the patient, and the patient acknowledged understanding to return to the ED if noticed. Patient was stable upon discharge.           Final Clinical Impression(s) / ED Diagnoses Final diagnoses:  Fall, initial encounter  Laceration of scalp, initial encounter    Rx /  DC Orders ED Discharge Orders     None         Wilnette Kales, [place] 07/27/22 [place], Ankit, MD 07/28/22 [phone]

## 2022-07-27 NOTE — ED Notes (Signed)
Patient transported to CT 

## 2022-07-27 NOTE — Discharge Instructions (Addendum)
As your imaging studies ordered today in the emergency department were negative.  Your laceration was repaired using 3 staples.  Recommend staple removal in 7 to 10 days here, at an urgent care or at your primary care.  Please do not hesitate to return to emergency department if the worrisome signs and symptoms we discussed become apparent.

## 2022-08-04 ENCOUNTER — Encounter: Payer: Self-pay | Admitting: Nurse Practitioner

## 2022-08-04 ENCOUNTER — Ambulatory Visit (INDEPENDENT_AMBULATORY_CARE_PROVIDER_SITE_OTHER): Payer: Medicare Other | Admitting: Nurse Practitioner

## 2022-08-04 VITALS — BP 161/59 | HR 63 | Temp 98.0°F | Resp 20 | Ht 59.0 in | Wt 155.0 lb

## 2022-08-04 DIAGNOSIS — Z4802 Encounter for removal of sutures: Secondary | ICD-10-CM

## 2022-08-04 NOTE — Progress Notes (Signed)
   Subjective:    Patient ID: Bonnie Tanner, female    DOB: [birth year], 81 y.o.   MRN: [id number]   Chief Complaint: Suture / Staple Removal   Suture / Staple Removal   Patient fell going down steps and lacerated her scalp. She went to urgent care and 3 staples were inserted. She is here today for staple removal.    Review of Systems  Constitutional:  Negative for diaphoresis.  Eyes:  Negative for pain.  Respiratory:  Negative for shortness of breath.   Cardiovascular:  Negative for chest pain, palpitations and leg swelling.  Gastrointestinal:  Negative for abdominal pain.  Endocrine: Negative for polydipsia.  Skin:  Negative for rash.  Neurological:  Negative for dizziness, weakness and headaches.  Hematological:  Does not bruise/bleed easily.  All other systems reviewed and are negative.      Objective:   Physical Exam Constitutional:      Appearance: Normal appearance.  Cardiovascular:     Rate and Rhythm: Normal rate and regular rhythm.     Heart sounds: Normal heart sounds.  Pulmonary:     Breath sounds: Normal breath sounds.  Skin:    General: Skin is warm.     Comments: Scalp wound skin edges well approximated- no erythema or drainage. 3 staples removed  Neurological:     General: No focal deficit present.     Mental Status: She is alert and oriented to person, place, and time.  Psychiatric:        Mood and Affect: Mood normal.        Behavior: Behavior normal.   BP (!) 161/59   Pulse 63   Temp 98 F (36.7 C) (Temporal)   Resp 20   Ht '4\' 11"'$  (1.499 m)   Wt 155 lb (70.3 kg)   SpO2 97%   BMI 31.31 kg/m          Assessment & Plan:  Bonnie Tanner in today with chief complaint of Suture / Staple Removal   1. Encounter for staple removal No special care needed    The above assessment and management plan was discussed with the patient. The patient verbalized understanding of and has agreed to the management plan. Patient is aware to call the  clinic if symptoms persist or worsen. Patient is aware when to return to the clinic for a follow-up visit. Patient educated on when it is appropriate to go to the emergency department.   [name], FNP

## 2022-08-16 ENCOUNTER — Encounter: Payer: Self-pay | Admitting: Family Medicine

## 2022-08-16 ENCOUNTER — Ambulatory Visit (INDEPENDENT_AMBULATORY_CARE_PROVIDER_SITE_OTHER): Payer: Medicare Other | Admitting: Family Medicine

## 2022-08-16 VITALS — BP 134/68 | HR 72 | Temp 98.3°F | Ht 59.0 in | Wt 152.4 lb

## 2022-08-16 DIAGNOSIS — J329 Chronic sinusitis, unspecified: Secondary | ICD-10-CM

## 2022-08-16 DIAGNOSIS — J40 Bronchitis, not specified as acute or chronic: Secondary | ICD-10-CM

## 2022-08-16 DIAGNOSIS — J069 Acute upper respiratory infection, unspecified: Secondary | ICD-10-CM

## 2022-08-16 MED ORDER — PSEUDOEPHEDRINE-GUAIFENESIN ER 120-1200 MG PO TB12: 1.0000 | ORAL_TABLET | Freq: Two times a day (BID) | ORAL | 99 refills | Status: DC

## 2022-08-16 MED ORDER — BETAMETHASONE SOD PHOS & ACET 6 (3-3) MG/ML IJ SUSP
6.0000 mg | Freq: Once | INTRAMUSCULAR | Status: AC
  Administered 2022-08-16: 6 mg via INTRAMUSCULAR

## 2022-08-16 MED ORDER — CEFPROZIL 500 MG PO TABS: 500.0000 mg | ORAL_TABLET | Freq: Two times a day (BID) | ORAL | 0 refills | Status: DC

## 2022-08-16 NOTE — Progress Notes (Signed)
Chief Complaint  Patient presents with   Nasal Congestion    HPI  Patient presents today for lots of allergies. Four days ago, Tide detergent smell hit her. Started coughing and sneezing. Hoarse since yesterday. Cough is nonproductive. Denies dyspnea. Nose congested. No fever.  PMH: Smoking status noted ROS: Per HPI  Objective: BP 134/68   Pulse 72   Temp 98.3 F (36.8 C)   Ht '4\' 11"'$  (1.499 m)   Wt 152 lb 6.4 oz (69.1 kg)   SpO2 95%   BMI 30.78 kg/m  Gen: NAD, alert, cooperative with exam HEENT: NCAT, EOMI, PERRL CV: RRR, good S1/S2, no murmur Resp: CTABL, no wheezes, non-labored Abd: SNTND, BS present, no guarding or organomegaly Ext: No edema, warm Neuro: Alert and oriented, No gross deficits  Assessment and plan:  1. Upper respiratory infection with cough and congestion   2. Sinobronchitis     Meds ordered this encounter  Medications   Pseudoephedrine-Guaifenesin [phone] MG TB12    Sig: Take 1 tablet by mouth 2 (two) times daily. For congestion    Dispense:  20 tablet    Refill:  PRN   cefPROZIL (CEFZIL) 500 MG tablet    Sig: Take 1 tablet (500 mg total) by mouth 2 (two) times daily. For infection. Take all of this medication.    Dispense:  20 tablet    Refill:  0   betamethasone acetate-betamethasone sodium phosphate (CELESTONE) injection 6 mg    Orders Placed This Encounter  Procedures   COVID-19, Flu A+B and RSV    Order Specific Question:   Previously tested for COVID-19    Answer:   Unknown    Order Specific Question:   Resident in a congregate (group) care setting    Answer:   Unknown    Order Specific Question:   Is the patient student?    Answer:   No    Order Specific Question:   Employed in healthcare setting    Answer:   Unknown    Order Specific Question:   Pregnant    Answer:   Unknown    Order Specific Question:   Has patient completed COVID vaccination(s) (2 doses of Pfizer/Moderna 1 dose of [employer])    Answer:   Unknown     Follow up as needed.  Claretta Fraise, MD

## 2022-08-17 LAB — COVID-19, FLU A+B AND RSV
Influenza A, NAA: NOT DETECTED
Influenza B, NAA: NOT DETECTED
RSV, NAA: NOT DETECTED
SARS-CoV-2, NAA: NOT DETECTED

## 2022-11-07 ENCOUNTER — Telehealth: Payer: Self-pay | Admitting: Nurse Practitioner

## 2022-11-07 DIAGNOSIS — E039 Hypothyroidism, unspecified: Secondary | ICD-10-CM

## 2022-11-07 MED ORDER — LEVOTHYROXINE SODIUM 88 MCG PO TABS: 88.0000 ug | ORAL_TABLET | Freq: Every day | ORAL | 0 refills | Status: DC

## 2022-11-07 NOTE — Telephone Encounter (Signed)
30 day supply sent to [facility] per patients request. Left detailed message on patients voicemail

## 2022-11-27 ENCOUNTER — Telehealth: Payer: Self-pay | Admitting: Nurse Practitioner

## 2022-11-27 NOTE — Telephone Encounter (Signed)
Called patient to schedule Medicare Annual Wellness Visit (AWV). Left message for patient to call back and schedule Medicare Annual Wellness Visit (AWV).  Last date of AWV: 03/29/2021   Please schedule an appointment at any time with [facility].  If any questions, please contact me at [phone].  Thank you,  [place] ??[phone]

## 2022-11-28 ENCOUNTER — Ambulatory Visit (INDEPENDENT_AMBULATORY_CARE_PROVIDER_SITE_OTHER): Payer: Medicare Other | Admitting: Family

## 2022-11-28 ENCOUNTER — Encounter: Payer: Self-pay | Admitting: Family

## 2022-11-28 VITALS — BP 157/76 | HR 66 | Temp 97.0°F | Ht 59.0 in | Wt 154.0 lb

## 2022-11-28 DIAGNOSIS — H109 Unspecified conjunctivitis: Secondary | ICD-10-CM

## 2022-11-28 DIAGNOSIS — H1032 Unspecified acute conjunctivitis, left eye: Secondary | ICD-10-CM

## 2022-11-28 MED ORDER — POLYMYXIN B-TRIMETHOPRIM 10000-0.1 UNIT/ML-% OP SOLN: 1.0000 [drp] | Freq: Four times a day (QID) | OPHTHALMIC | 0 refills | Status: DC

## 2022-11-28 NOTE — Progress Notes (Signed)
   Subjective:    Patient ID: Bonnie Tanner, female    DOB: 03/20/1941, 82 y.o.   MRN: [id number]  Chief Complaint  Patient presents with   Conjunctivitis   Pt presents to the office today with left eye erythemas that mild started last week, however, yesterday she noticed increased redness, purulent discharge. Conjunctivitis  The current episode started 5 to 7 days ago. The onset was sudden. The problem occurs continuously. The problem has been gradually worsening. Associated symptoms include eye itching, photophobia, eye discharge, eye pain and eye redness. Pertinent negatives include no decreased vision and no double vision.      Review of Systems  Eyes:  Positive for photophobia, pain, discharge, redness and itching. Negative for double vision.  All other systems reviewed and are negative.      Objective:   Physical Exam Vitals reviewed.  Constitutional:      General: She is not in acute distress.    Appearance: She is well-developed.  HENT:     Head: Normocephalic and atraumatic.     Right Ear: Tympanic membrane normal.     Left Ear: Tympanic membrane normal.  Eyes:     Conjunctiva/sclera:     Left eye: Left conjunctiva is injected. Exudate present.     Pupils: Pupils are equal, round, and reactive to light.  Neck:     Thyroid: No thyromegaly.  Cardiovascular:     Rate and Rhythm: Normal rate and regular rhythm.     Heart sounds: Normal heart sounds. No murmur heard. Pulmonary:     Effort: Pulmonary effort is normal. No respiratory distress.     Breath sounds: Normal breath sounds. No wheezing.  Abdominal:     General: Bowel sounds are normal. There is no distension.     Palpations: Abdomen is soft.     Tenderness: There is no abdominal tenderness.  Musculoskeletal:        General: No tenderness. Normal range of motion.     Cervical back: Normal range of motion and neck supple.  Skin:    General: Skin is warm and dry.  Neurological:     Mental Status: She is  alert and oriented to person, place, and time.     Cranial Nerves: No cranial nerve deficit.     Deep Tendon Reflexes: Reflexes are normal and symmetric.  Psychiatric:        Behavior: Behavior normal.        Thought Content: Thought content normal.        Judgment: Judgment normal.     BP (!) 157/76   Pulse 66   Temp (!) 97 F (36.1 C) (Temporal)   Ht 4' 11"$  (1.499 m)   Wt 154 lb (69.9 kg)   SpO2 98%   BMI 31.10 kg/m        Assessment & Plan:  [name] comes in today with chief complaint of Conjunctivitis   Diagnosis and orders addressed:  1. Bacterial conjunctivitis Good hand hygiene  Avoid rubbing eye Cool compresses New pillowcase Follow up if symptoms worsen or do not improve  - trimethoprim-polymyxin b (POLYTRIM) ophthalmic solution; Place 1 drop into the left eye every 6 (six) hours.  Dispense: 10 mL; Refill: 0   Evelina Dun, FNP

## 2022-11-28 NOTE — Patient Instructions (Signed)
Bacterial Conjunctivitis, Adult ?Bacterial conjunctivitis is an infection of the clear membrane that covers the white part of the eye and the inner surface of the eyelid (conjunctiva). When the blood vessels in the conjunctiva become inflamed, the eye becomes red or pink. The eye often feels irritated or itchy. Bacterial conjunctivitis spreads easily from person to person (is contagious). It also spreads easily from one eye to the other eye. ?What are the causes? ?This condition is caused by bacteria. You may get the infection if you come into close contact with: ?A person who is infected with the bacteria. ?Items that are contaminated with the bacteria, such as a face towel, contact lens solution, or eye makeup. ?What increases the risk? ?You are more likely to develop this condition if: ?You are exposed to other people who have the infection. ?You wear contact lenses. ?You have a sinus infection. ?You have had a recent eye injury or surgery. ?You have a weak body defense system (immune system). ?You have a medical condition that causes dry eyes. ?What are the signs or symptoms? ?Symptoms of this condition include: ?Thick, yellowish discharge from the eye. This may turn into a crust on the eyelid overnight and cause your eyelids to stick together. ?Tearing or watery eyes. ?Itchy eyes. ?Burning feeling in your eyes. ?Eye redness. ?Swollen eyelids. ?Blurred vision. ?How is this diagnosed? ?This condition is diagnosed based on your symptoms and medical history. Your health care provider may also take a sample of discharge from your eye to find the cause of your infection. ?How is this treated? ?This condition may be treated with: ?Antibiotic eye drops or ointment to clear the infection more quickly and prevent the spread of infection to others. ?Antibiotic medicines taken by mouth (orally) to treat infections that do not respond to drops or ointments or that last longer than 10 days. ?Cool, wet cloths (cool  compresses) placed on the eyes. ?Artificial tears applied 2-6 times a day. ?Follow these instructions at home: ?Medicines ?Take or apply your antibiotic medicine as told by your health care provider. Do not stop using the antibiotic, even if your condition improves, unless directed by your health care provider. ?Take or apply over-the-counter and prescription medicines only as told by your health care provider. ?Be very careful to avoid touching the edge of your eyelid with the eye-drop bottle or the ointment tube when you apply medicines to the affected eye. This will keep you from spreading the infection to your other eye or to other people. ?Managing discomfort ?Gently wipe away any drainage from your eye with a warm, wet washcloth or a cotton ball. ?Apply a clean, cool compress to your eye for 10-20 minutes, 3-4 times a day. ?General instructions ?Do not wear contact lenses until the inflammation is gone and your health care provider says it is safe to wear them again. Ask your health care provider how to sterilize or replace your contact lenses before you use them again. Wear glasses until you can resume wearing contact lenses. ?Avoid wearing eye makeup until the inflammation is gone. Throw away any old eye cosmetics that may be contaminated. ?Change or wash your pillowcase every day. ?Do not share towels or washcloths. This may spread the infection. ?Wash your hands often with soap and water for at least 20 seconds and especially before touching your face or eyes. Use paper towels to dry your hands. ?Avoid touching or rubbing your eyes. ?Do not drive or use heavy machinery if your vision is blurred. ?Contact   a health care provider if: ?You have a fever. ?Your symptoms do not get better after 10 days. ?Get help right away if: ?You have a fever and your symptoms suddenly get worse. ?You have severe pain when you move your eye. ?You have facial pain, redness, or swelling. ?You have a sudden loss of  vision. ?Summary ?Bacterial conjunctivitis is an infection of the clear membrane that covers the white part of the eye and the inner surface of the eyelid (conjunctiva). ?Bacterial conjunctivitis spreads easily from eye to eye and from person to person (is contagious). ?Wash your hands often with soap and water for at least 20 seconds and especially before touching your face or eyes. Use paper towels to dry your hands. ?Take or apply your antibiotic medicine as told by your health care provider. Do not stop using the antibiotic even if your condition improves. ?Contact a health care provider if you have a fever or if your symptoms do not get better after 10 days. Get help right away if you have a sudden loss of vision. ?This information is not intended to replace advice given to you by your health care provider. Make sure you discuss any questions you have with your health care provider. ?Document Revised: 01/05/2021 Document Reviewed: 01/05/2021 ?Elsevier Patient Education ? 2023 Elsevier Inc. ? ?

## 2023-01-09 ENCOUNTER — Ambulatory Visit: Payer: Medicare Other | Admitting: Nurse Practitioner

## 2023-01-12 ENCOUNTER — Telehealth: Payer: Self-pay | Admitting: Nurse Practitioner

## 2023-01-12 ENCOUNTER — Encounter: Payer: Self-pay | Admitting: Nurse Practitioner

## 2023-01-12 NOTE — Telephone Encounter (Signed)
Called patient to schedule Medicare Annual Wellness Visit (AWV). Left message for patient to call back and schedule Medicare Annual Wellness Visit (AWV).  Last date of AWV: 03/29/2021   Please schedule an appointment at any time with either Vernona Rieger or [place], [facility]. .  If any questions, please contact me at [phone].  Thank you,  Judeth Cornfield,  AMB Clinical Support [facility] AWV Program Direct Dial ??[phone]

## 2023-02-12 ENCOUNTER — Ambulatory Visit (INDEPENDENT_AMBULATORY_CARE_PROVIDER_SITE_OTHER): Payer: Medicare Other | Admitting: Nurse Practitioner

## 2023-02-12 ENCOUNTER — Encounter: Payer: Self-pay | Admitting: Nurse Practitioner

## 2023-02-12 VITALS — BP 142/78 | HR 55 | Temp 98.0°F | Resp 20 | Ht 59.0 in | Wt 148.4 lb

## 2023-02-12 DIAGNOSIS — I10 Essential (primary) hypertension: Secondary | ICD-10-CM

## 2023-02-12 DIAGNOSIS — I2510 Atherosclerotic heart disease of native coronary artery without angina pectoris: Secondary | ICD-10-CM | POA: Diagnosis not present

## 2023-02-12 DIAGNOSIS — I2583 Coronary atherosclerosis due to lipid rich plaque: Secondary | ICD-10-CM

## 2023-02-12 DIAGNOSIS — Z23 Encounter for immunization: Secondary | ICD-10-CM

## 2023-02-12 DIAGNOSIS — E039 Hypothyroidism, unspecified: Secondary | ICD-10-CM | POA: Diagnosis not present

## 2023-02-12 DIAGNOSIS — E785 Hyperlipidemia, unspecified: Secondary | ICD-10-CM | POA: Diagnosis not present

## 2023-02-12 DIAGNOSIS — K219 Gastro-esophageal reflux disease without esophagitis: Secondary | ICD-10-CM | POA: Diagnosis not present

## 2023-02-12 DIAGNOSIS — Z6832 Body mass index (BMI) 32.0-32.9, adult: Secondary | ICD-10-CM

## 2023-02-12 MED ORDER — OMEPRAZOLE 40 MG PO CPDR: 40.0000 mg | DELAYED_RELEASE_CAPSULE | Freq: Every day | ORAL | 1 refills | Status: DC

## 2023-02-12 MED ORDER — LISINOPRIL 40 MG PO TABS: 40.0000 mg | ORAL_TABLET | Freq: Every day | ORAL | 1 refills | Status: DC

## 2023-02-12 MED ORDER — ATORVASTATIN CALCIUM 40 MG PO TABS: 40.0000 mg | ORAL_TABLET | Freq: Every day | ORAL | 1 refills | Status: DC

## 2023-02-12 MED ORDER — LEVOTHYROXINE SODIUM 88 MCG PO TABS: 88.0000 ug | ORAL_TABLET | Freq: Every day | ORAL | 1 refills | Status: DC

## 2023-02-12 MED ORDER — VERAPAMIL HCL 120 MG PO TABS: 120.0000 mg | ORAL_TABLET | Freq: Every day | ORAL | 1 refills | Status: DC

## 2023-02-12 NOTE — Progress Notes (Signed)
Subjective:    Patient ID: Bonnie Tanner, female    DOB: [birth year], 82 y.o.   MRN: [id number]   Chief Complaint: Medical Management of Chronic Issues    HPI:  Bonnie Tanner is a 82 y.o. who identifies as a female who was assigned female at birth.   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 ESSENTIAL HYPERTENSION, BENIGN No c/o chest pain, sob or headache. Doe snot check blood pressure at home. BP Readings from Last 3 Encounters:  02/12/23 (!) 159/64  11/28/22 (!) 157/76  08/16/22 134/68     2. Coronary artery disease due to lipid rich plaque Has not seen cardiology  3. Hyperlipidemia LDL goal <100 Does not really watch diet and does no dedicated exercise. Lab Results  Component Value Date   CHOL 152 07/10/2022   HDL 66 07/10/2022   LDLCALC 74 07/10/2022   TRIG 55 07/10/2022   CHOLHDL 2.3 07/10/2022     4. Gastroesophageal reflux disease without esophagitis Is on omeprazole and is doing well  5. Acquired hypothyroidism No issues that she is aware of. Lab Results  Component Value Date   TSH 2.480 01/02/2022     6. BMI 32.0-32.9,adult Weight is down 6lbs Wt Readings from Last 3 Encounters:  02/12/23 148 lb 6.4 oz (67.3 kg)  11/28/22 154 lb (69.9 kg)  08/16/22 152 lb 6.4 oz (69.1 kg)   BMI Readings from Last 3 Encounters:  02/12/23 29.97 kg/m  11/28/22 31.10 kg/m  08/16/22 30.78 kg/m      New complaints: A home health nurse came to house and heart rate was 64. She denis any fatigue or syncopal episodes.  Allergies  Allergen Reactions   Erythromycin Swelling   Latex Other (See Comments)    Skin gets real red   Nickel     Other reaction(s): Other (See Comments) blisters   Penicillins Hives   Sulfonamide Derivatives Rash   Outpatient Encounter Medications as of 02/12/2023   Medication Sig   aspirin 325 MG tablet Take 325 mg by mouth daily.   atorvastatin (LIPITOR) 40 MG tablet Take 1 tablet (40 mg total) by mouth daily.   cholecalciferol (VITAMIN D) 1000 UNITS tablet Take 1,000 Units by mouth daily.   Glucosamine-Chondroit-Vit C-Mn (GLUCOSAMINE 1500 COMPLEX PO) Take by mouth.   levothyroxine (SYNTHROID) 88 MCG tablet Take 1 tablet (88 mcg total) by mouth daily before breakfast.   lisinopril (ZESTRIL) 40 MG tablet Take 1 tablet (40 mg total) by mouth daily.   multivitamin-iron-minerals-folic acid (CENTRUM) chewable tablet Chew 1 tablet by mouth daily.   nystatin cream (MYCOSTATIN) Apply 1 application topically 2 (two) times daily.   omeprazole (PRILOSEC) 40 MG capsule Take 1 capsule (40 mg total) by mouth daily.   trimethoprim-polymyxin b (POLYTRIM) ophthalmic solution Place 1 drop into the left eye every 6 (six) hours.   verapamil (CALAN) 120 MG tablet Take 1 tablet (120 mg total) by mouth daily.   [DISCONTINUED] Pseudoephedrine-Guaifenesin [phone] MG TB12 Take 1 tablet by mouth 2 (two) times daily. For congestion   [DISCONTINUED] cefPROZIL (CEFZIL) 500 MG tablet Take 1 tablet (500 mg total) by mouth 2 (two) times daily. For infection. Take all of this medication.   No facility-administered encounter medications on file as of 02/12/2023.    Past Surgical History:  Procedure Laterality Date  BREAST LUMPECTOMY  04/19/2010   radiation therapy right breast   CARDIAC CATHETERIZATION  09/2009   CATARACT EXTRACTION  2006   bilateral   EYE SURGERY  2000   made her ducts    Family History  Problem Relation Age of Onset   Heart disease Mother 57   Emphysema Father    Heart disease Father    Throat cancer Brother    Stroke Brother    Healthy Son    Prostate cancer Paternal Uncle    Stomach cancer Paternal Uncle       Controlled substance contract: n/a     Review of Systems  Constitutional:  Negative for diaphoresis.  Eyes:  Negative for pain.   Respiratory:  Negative for shortness of breath.   Cardiovascular:  Negative for chest pain, palpitations and leg swelling.  Gastrointestinal:  Negative for abdominal pain.  Endocrine: Negative for polydipsia.  Skin:  Negative for rash.  Neurological:  Negative for dizziness, weakness and headaches.  Hematological:  Does not bruise/bleed easily.  All other systems reviewed and are negative.      Objective:   Physical Exam Vitals and nursing note reviewed.  Constitutional:      General: She is not in acute distress.    Appearance: Normal appearance. She is well-developed.  HENT:     Head: Normocephalic.     Right Ear: Tympanic membrane normal.     Left Ear: Tympanic membrane normal.     Nose: Nose normal.     Mouth/Throat:     Mouth: Mucous membranes are moist.  Eyes:     Pupils: Pupils are equal, round, and reactive to light.  Neck:     Vascular: No carotid bruit or JVD.  Cardiovascular:     Rate and Rhythm: Normal rate and regular rhythm.     Heart sounds: Normal heart sounds.  Pulmonary:     Effort: Pulmonary effort is normal. No respiratory distress.     Breath sounds: Normal breath sounds. No wheezing or rales.  Chest:     Chest wall: No tenderness.  Abdominal:     General: Bowel sounds are normal. There is no distension or abdominal bruit.     Palpations: Abdomen is soft. There is no hepatomegaly, splenomegaly, mass or pulsatile mass.     Tenderness: There is no abdominal tenderness.  Musculoskeletal:        General: Normal range of motion.     Cervical back: Normal range of motion and neck supple.  Lymphadenopathy:     Cervical: No cervical adenopathy.  Skin:    General: Skin is warm and dry.  Neurological:     Mental Status: She is alert and oriented to person, place, and time.     Deep Tendon Reflexes: Reflexes are normal and symmetric.  Psychiatric:        Behavior: Behavior normal.        Thought Content: Thought content normal.        Judgment:  Judgment normal.    BP (!) 142/78   Pulse (!) 55   Temp 98 F (36.7 C) (Temporal)   Resp 20   Ht 4\' 11"  (1.499 m)   Wt 148 lb 6.4 oz (67.3 kg)   SpO2 97%   BMI 29.97 kg/m         Assessment & Plan:  [name] comes in today with chief complaint of Medical Management of Chronic Issues   Diagnosis and orders addressed:  1. ESSENTIAL HYPERTENSION,  BENIGN Low sodium diet - lisinopril (ZESTRIL) 40 MG tablet; Take 1 tablet (40 mg total) by mouth daily.  Dispense: 90 tablet; Refill: 1 - CBC with Differential/Platelet - CMP14+EGFR  2. Coronary artery disease due to lipid rich plaque - verapamil (CALAN) 120 MG tablet; Take 1 tablet (120 mg total) by mouth daily.  Dispense: 90 tablet; Refill: 1  3. Hyperlipidemia LDL goal <100 Low fat diet - atorvastatin (LIPITOR) 40 MG tablet; Take 1 tablet (40 mg total) by mouth daily.  Dispense: 90 tablet; Refill: 1 - Lipid panel  4. Gastroesophageal reflux disease without esophagitis Avoid spicy foods Do not eat 2 hours prior to bedtime - omeprazole (PRILOSEC) 40 MG capsule; Take 1 capsule (40 mg total) by mouth daily.  Dispense: 90 capsule; Refill: 1  5. Acquired hypothyroidism Labs pending - levothyroxine (SYNTHROID) 88 MCG tablet; Take 1 tablet (88 mcg total) by mouth daily before breakfast.  Dispense: 90 tablet; Refill: 1 - Thyroid Panel With TSH  6. BMI 32.0-32.9,adult Discussed diet and exercise for person with BMI >25 Will recheck weight in 3-6 months    Labs pending Health Maintenance reviewed Diet and exercise encouraged  Follow up plan: 6 months   Mary-Margaret Daphine Deutscher, FNP

## 2023-02-12 NOTE — Patient Instructions (Signed)
Fall Prevention in the Home, Adult Falls can cause injuries and can happen to people of all ages. There are many things you can do to make your home safer and to help prevent falls. What actions can I take to prevent falls? General information Use good lighting in all rooms. Make sure to: Replace any light bulbs that burn out. Turn on the lights in dark areas and use night-lights. Keep items that you use often in easy-to-reach places. Lower the shelves around your home if needed. Move furniture so that there are clear paths around it. Do not use throw rugs or other things on the floor that can make you trip. If any of your floors are uneven, fix them. Add color or contrast paint or tape to clearly mark and help you see: Grab bars or handrails. First and last steps of staircases. Where the edge of each step is. If you use a ladder or stepladder: Make sure that it is fully opened. Do not climb a closed ladder. Make sure the sides of the ladder are locked in place. Have someone hold the ladder while you use it. Know where your pets are as you move through your home. What can I do in the bathroom?     Keep the floor dry. Clean up any water on the floor right away. Remove soap buildup in the bathtub or shower. Buildup makes bathtubs and showers slippery. Use non-skid mats or decals on the floor of the bathtub or shower. Attach bath mats securely with double-sided, non-slip rug tape. If you need to sit down in the shower, use a non-slip stool. Install grab bars by the toilet and in the bathtub and shower. Do not use towel bars as grab bars. What can I do in the bedroom? Make sure that you have a light by your bed that is easy to reach. Do not use any sheets or blankets on your bed that hang to the floor. Have a firm chair or bench with side arms that you can use for support when you get dressed. What can I do in the kitchen? Clean up any spills right away. If you need to reach something  above you, use a step stool with a grab bar. Keep electrical cords out of the way. Do not use floor polish or wax that makes floors slippery. What can I do with my stairs? Do not leave anything on the stairs. Make sure that you have a light switch at the top and the bottom of the stairs. Make sure that there are handrails on both sides of the stairs. Fix handrails that are broken or loose. Install non-slip stair treads on all your stairs if they do not have carpet. Avoid having throw rugs at the top or bottom of the stairs. Choose a carpet that does not hide the edge of the steps on the stairs. Make sure that the carpet is firmly attached to the stairs. Fix carpet that is loose or worn. What can I do on the outside of my home? Use bright outdoor lighting. Fix the edges of walkways and driveways and fix any cracks. Clear paths of anything that can make you trip, such as tools or rocks. Add color or contrast paint or tape to clearly mark and help you see anything that might make you trip as you walk through a door, such as a raised step or threshold. Trim any bushes or trees on paths to your home. Check to see if handrails are loose   or broken and that both sides of all steps have handrails. Install guardrails along the edges of any raised decks and porches. Have leaves, snow, or ice cleared regularly. Use sand, salt, or ice melter on paths if you live where there is ice and snow during the winter. Clean up any spills in your garage right away. This includes grease or oil spills. What other actions can I take? Review your medicines with your doctor. Some medicines can cause dizziness or changes in blood pressure, which increase your risk of falling. Wear shoes that: Have a low heel. Do not wear high heels. Have rubber bottoms and are closed at the toe. Feel good on your feet and fit well. Use tools that help you move around if needed. These include: Canes. Walkers. Scooters. Crutches. Ask  your doctor what else you can do to help prevent falls. This may include seeing a physical therapist to learn to do exercises to move better and get stronger. Where to find more information Centers for Disease Control and Prevention, [employer]: cdc.gov National Institute on Aging: nia.nih.gov National Institute on Aging: nia.nih.gov Contact a doctor if: You are afraid of falling at home. You feel weak, drowsy, or dizzy at home. You fall at home. Get help right away if you: Lose consciousness or have trouble moving after a fall. Have a fall that causes a head injury. These symptoms may be an emergency. Get help right away. Call 911. Do not wait to see if the symptoms will go away. Do not drive yourself to the hospital. This information is not intended to replace advice given to you by your health care provider. Make sure you discuss any questions you have with your health care provider. Document Revised: 05/29/2022 Document Reviewed: 05/29/2022 Elsevier Patient Education  2023 Elsevier Inc.  

## 2023-02-13 LAB — CMP14+EGFR
ALT: 22 IU/L (ref 0–32)
AST: 24 IU/L (ref 0–40)
Albumin/Globulin Ratio: 1.8 (ref 1.2–2.2)
Albumin: 4.4 g/dL (ref 3.8–4.8)
Alkaline Phosphatase: 135 IU/L — ABNORMAL HIGH (ref 44–121)
BUN/Creatinine Ratio: 18 (ref 12–28)
BUN: 14 mg/dL (ref 8–27)
Bilirubin Total: 0.4 mg/dL (ref 0.0–1.2)
CO2: 25 mmol/L (ref 20–29)
Calcium: 10.3 mg/dL (ref 8.7–10.3)
Chloride: 104 mmol/L (ref 96–106)
Creatinine, Ser: 0.78 mg/dL (ref 0.57–1.00)
Globulin, Total: 2.4 g/dL (ref 1.5–4.5)
Glucose: 96 mg/dL (ref 70–99)
Potassium: 4.1 mmol/L (ref 3.5–5.2)
Sodium: 143 mmol/L (ref 134–144)
Total Protein: 6.8 g/dL (ref 6.0–8.5)
eGFR: 77 mL/min/{1.73_m2} (ref >59)

## 2023-02-13 LAB — CBC WITH DIFFERENTIAL/PLATELET
Basophils Absolute: 0 10*3/uL (ref 0.0–0.2)
Basos: 1 %
EOS (ABSOLUTE): 0.2 10*3/uL (ref 0.0–0.4)
Eos: 4 %
Hematocrit: 40.3 % (ref 34.0–46.6)
Hemoglobin: 13.4 g/dL (ref 11.1–15.9)
Immature Grans (Abs): 0.1 10*3/uL (ref 0.0–0.1)
Immature Granulocytes: 1 %
Lymphocytes Absolute: 2.2 10*3/uL (ref 0.7–3.1)
Lymphs: 41 %
MCH: 33.3 pg — ABNORMAL HIGH (ref 26.6–33.0)
MCHC: 33.3 g/dL (ref 31.5–35.7)
MCV: 100 fL — ABNORMAL HIGH (ref 79–97)
Monocytes Absolute: 0.4 10*3/uL (ref 0.1–0.9)
Monocytes: 8 %
Neutrophils Absolute: 2.4 10*3/uL (ref 1.4–7.0)
Neutrophils: 45 %
Platelets: 244 10*3/uL (ref 150–450)
RBC: 4.02 x10E6/uL (ref 3.77–5.28)
RDW: 12.1 % (ref 11.7–15.4)
WBC: 5.3 10*3/uL (ref 3.4–10.8)

## 2023-02-13 LAB — LIPID PANEL
Chol/HDL Ratio: 2.5 ratio (ref 0.0–4.4)
Cholesterol, Total: 152 mg/dL (ref 100–199)
HDL: 62 mg/dL (ref >39)
LDL Chol Calc (NIH): 73 mg/dL (ref 0–99)
Triglycerides: 92 mg/dL (ref 0–149)
VLDL Cholesterol Cal: 17 mg/dL (ref 5–40)

## 2023-02-13 LAB — THYROID PANEL WITH TSH
Free Thyroxine Index: 2.8 (ref 1.2–4.9)
T3 Uptake Ratio: 27 % (ref 24–39)
T4, Total: 10.5 ug/dL (ref 4.5–12.0)
TSH: 3.1 u[IU]/mL (ref 0.450–4.500)

## 2023-02-13 NOTE — Addendum Note (Signed)
Addended by: Cleda Daub on: 02/13/2023 09:30 AM   Modules accepted: Orders

## 2023-02-22 ENCOUNTER — Ambulatory Visit (INDEPENDENT_AMBULATORY_CARE_PROVIDER_SITE_OTHER): Payer: Medicare Other

## 2023-02-22 VITALS — Ht 59.0 in | Wt 148.0 lb

## 2023-02-22 DIAGNOSIS — Z0000 Encounter for general adult medical examination without abnormal findings: Secondary | ICD-10-CM | POA: Diagnosis not present

## 2023-02-22 NOTE — Progress Notes (Signed)
Subjective:   LATANJA MASLEN is a 82 y.o. female who presents for Medicare Annual (Subsequent) preventive examination. I connected with  [name] on 02/22/23 by a audio enabled telemedicine application and verified that I am speaking with the correct person using two identifiers.  Patient Location: Home  Provider Location: Home Office  I discussed the limitations of evaluation and management by telemedicine. The patient expressed understanding and agreed to proceed.  Review of Systems     Cardiac Risk Factors include: advanced age (>3men, >16 women);hypertension;dyslipidemia     Objective:    Today's Vitals   02/22/23 1445  Weight: 148 lb (67.1 kg)  Height: 4\' 11"  (1.499 m)   Body mass index is 29.89 kg/m.     02/22/2023    2:49 PM 07/27/2022   12:17 PM 03/29/2021    3:50 PM 02/02/2015    2:36 PM  Advanced Directives  Does Patient Have a Medical Advance Directive? No No Yes No  Type of Best boy of [place];Living will   Copy of Healthcare Power of Attorney in Chart?   No - copy requested   Would patient like information on creating a medical advance directive? No - Patient declined   Yes - Educational materials given    Current Medications (verified) Outpatient Encounter Medications as of 02/22/2023  Medication Sig   aspirin 325 MG tablet Take 325 mg by mouth daily.   atorvastatin (LIPITOR) 40 MG tablet Take 1 tablet (40 mg total) by mouth daily.   cholecalciferol (VITAMIN D) 1000 UNITS tablet Take 1,000 Units by mouth daily.   Glucosamine-Chondroit-Vit C-Mn (GLUCOSAMINE 1500 COMPLEX PO) Take by mouth.   levothyroxine (SYNTHROID) 88 MCG tablet Take 1 tablet (88 mcg total) by mouth daily before breakfast.   lisinopril (ZESTRIL) 40 MG tablet Take 1 tablet (40 mg total) by mouth daily.   multivitamin-iron-minerals-folic acid (CENTRUM) chewable tablet Chew 1 tablet by mouth daily.   nystatin cream (MYCOSTATIN) Apply 1 application topically 2  (two) times daily.   omeprazole (PRILOSEC) 40 MG capsule Take 1 capsule (40 mg total) by mouth daily.   trimethoprim-polymyxin b (POLYTRIM) ophthalmic solution Place 1 drop into the left eye every 6 (six) hours.   verapamil (CALAN) 120 MG tablet Take 1 tablet (120 mg total) by mouth daily.   No facility-administered encounter medications on file as of 02/22/2023.    Allergies (verified) Erythromycin, Latex, Nickel, Penicillins, and Sulfonamide derivatives   History: Past Medical History:  Diagnosis Date   Arthritis    CAD (coronary artery disease)    Non obstructive   Cancer (HCC) 2011   breast/radiation Tx/Lumpectomy   GERD (gastroesophageal reflux disease)    Heart murmur    Hyperlipidemia    Hypertension    Hypothyroidism    Past Surgical History:  Procedure Laterality Date   BREAST LUMPECTOMY  04/19/2010   radiation therapy right breast   CARDIAC CATHETERIZATION  09/2009   CATARACT EXTRACTION  2006   bilateral   EYE SURGERY  2000   made her ducts   Family History  Problem Relation Age of Onset   Heart disease Mother 79   Emphysema Father    Heart disease Father    Throat cancer Brother    Stroke Brother    Healthy Son    Prostate cancer Paternal Uncle    Stomach cancer Paternal Uncle    Social History   Socioeconomic History   Marital status: Married    Spouse  name: Not on file   Number of children: 1   Years of education: 14   Highest education level: Some college, no degree  Occupational History   Occupation: Beautician  Tobacco Use   Smoking status: Never   Smokeless tobacco: Never  Vaping Use   Vaping Use: Never used  Substance and Sexual Activity   Alcohol use: No   Drug use: No   Sexual activity: Yes  Other Topics Concern   Not on file  Social History Narrative   Lives in [place] with husband. 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   Social Determinants of Health   Financial  Resource Strain: Low Risk  (02/22/2023)   Overall Financial Resource Strain (CARDIA)    Difficulty of Paying Living Expenses: Not hard at all  Food Insecurity: No Food Insecurity (02/22/2023)   Hunger Vital Sign    Worried About Running Out of Food in the Last Year: Never true    Ran Out of Food in the Last Year: Never true  Transportation Needs: No Transportation Needs (02/22/2023)   PRAPARE - Administrator, Civil Service (Medical): No    Lack of Transportation (Non-Medical): No  Physical Activity: Insufficiently Active (02/22/2023)   Exercise Vital Sign    Days of Exercise per Week: 3 days    Minutes of Exercise per Session: 30 min  Stress: No Stress Concern Present (02/22/2023)   [employer] of Occupational Health - Occupational Stress Questionnaire    Feeling of Stress : Not at all  Social Connections: Moderately Integrated (02/22/2023)   Social Connection and Isolation Panel [NHANES]    Frequency of Communication with Friends and Family: More than three times a week    Frequency of Social Gatherings with Friends and Family: More than three times a week    Attends Religious Services: More than 4 times per year    Active Member of [employer] or Organizations: Yes    Attends Banker Meetings: More than 4 times per year    Marital Status: Widowed    Tobacco Counseling Counseling given: Not Answered   Clinical Intake:  Pre-visit preparation completed: Yes  Pain : No/denies pain     Nutritional Risks: None Diabetes: No  How often do you need to have someone help you when you read instructions, pamphlets, or other written materials from your doctor or pharmacy?: 1 - Never  Diabetic?no   Interpreter Needed?: No  Information entered by :: Renie Ora, LPN   Activities of Daily Living    02/22/2023    2:48 PM  In your present state of health, do you have any difficulty performing the following activities:  Hearing? 0  Vision? 0  Difficulty  concentrating or making decisions? 0  Walking or climbing stairs? 0  Dressing or bathing? 0  Doing errands, shopping? 0  Preparing Food and eating ? N  Using the Toilet? N  In the past six months, have you accidently leaked urine? N  Do you have problems with loss of bowel control? N  Managing your Medications? N  Managing your Finances? N  Housekeeping or managing your Housekeeping? N    Patient Care Team: Bennie Pierini, FNP as PCP - General (Nurse Practitioner) Rhea Belton Carie Caddy, MD as Consulting Physician (Gastroenterology)  Indicate any recent Medical Services you may have received from other than [facility] providers in the past year (date may be approximate).  Assessment:   This is a routine wellness examination for Mehan.  Hearing/Vision screen Vision Screening - Comments:: Wears rx glasses - up to date with routine eye exams with  Dr.Johnson   Dietary issues and exercise activities discussed: Current Exercise Habits: Home exercise routine, Type of exercise: walking, Time (Minutes): 30, Frequency (Times/Week): 3, Weekly Exercise (Minutes/Week): 90, Intensity: Mild, Exercise limited by: None identified   Goals Addressed             This Visit's Progress    Exercise 3x per week (30 min per time)   On track    Walk for 30 minutes 3 times per week       Depression Screen    02/22/2023    2:47 PM 11/28/2022    1:50 PM 08/16/2022    2:13 PM 07/10/2022    8:18 AM 01/02/2022    2:49 PM 06/27/2021   10:34 AM 04/15/2021    2:55 PM  PHQ 2/9 Scores  PHQ - 2 Score 0 0 0 0 0 0 0  PHQ- 9 Score    0 0 0 0    Fall Risk    02/22/2023    2:46 PM 11/28/2022    1:49 PM 08/16/2022    2:13 PM 07/10/2022    8:18 AM 01/02/2022    2:49 PM  Fall Risk   Falls in the past year? 0 0 1 0 0  Number falls in past yr: 0  0    Injury with Fall? 0 0 0    Risk for fall due to : No Fall Risks Impaired mobility;No Fall Risks History of fall(s)    Follow up Falls prevention discussed Falls  evaluation completed Falls evaluation completed      FALL RISK PREVENTION PERTAINING TO THE HOME:  Any stairs in or around the home? Yes  If so, are there any without handrails? No  Home free of loose throw rugs in walkways, pet beds, electrical cords, etc? Yes  Adequate lighting in your home to reduce risk of falls? Yes   ASSISTIVE DEVICES UTILIZED TO PREVENT FALLS:  Life alert? No  Use of a cane, walker or w/c? No  Grab bars in the bathroom? No  Shower chair or bench in shower? No  Elevated toilet seat or a handicapped toilet? No       06/24/2018    8:55 AM 02/02/2015    2:45 PM  MMSE - Mini Mental State Exam  Not completed:  Refused  Orientation to time 5 5  Orientation to Place 5 5  Registration 3 3  Attention/ Calculation 5 5  Recall 3 3  Language- name 2 objects 2 2  Language- repeat 1 1  Language- follow 3 step command 3 3  Language- read & follow direction 1 1  Write a sentence 1 1  Copy design 1 1  Total score 30 30        02/22/2023    2:48 PM 03/29/2021    3:45 PM  6CIT Screen  What Year? 0 points 0 points  What month? 0 points 0 points  What time? 0 points 0 points  Count back from 20 0 points 0 points  Months in reverse 0 points 0 points  Repeat phrase 0 points 0 points  Total Score 0 points 0 points    Immunizations Immunization History  Administered Date(s) Administered   Fluad Quad(high Dose 65+) 07/21/2019, 11/15/2020, 06/27/2021, 07/10/2022   Influenza Whole 07/09/2010   Influenza, High  Dose Seasonal PF 09/05/2016, 08/06/2017, 08/20/2018   Influenza,inj,Quad PF,6+ Mos 10/06/2013, 08/29/2014, 08/23/2015   Moderna SARS-COV2 Booster Vaccination 08/09/2020   Moderna Sars-Covid-2 Vaccination 10/14/2019, 11/11/2019   Pneumococcal Conjugate-13 02/02/2015   Pneumococcal Polysaccharide-23 10/21/2012   Td 07/09/2009   Tdap 07/27/2022   Zoster Recombinat (Shingrix) 01/02/2022, 02/12/2023    TDAP status: Up to date  Flu Vaccine status: Up to  date  Pneumococcal vaccine status: Up to date  Covid-19 vaccine status: Completed vaccines  Qualifies for Shingles Vaccine? Yes   Zostavax completed Yes   Shingrix Completed?: Yes  Screening Tests Health Maintenance  Topic Date Due   Hepatitis C Screening  Never done   COVID-19 Vaccine (3 - Moderna risk series) 02/28/2023 (Originally 09/06/2020)   MAMMOGRAM  08/17/2023 (Originally 05/09/2022)   DEXA SCAN  04/27/2023   INFLUENZA VACCINE  05/10/2023   Medicare Annual Wellness (AWV)  02/22/2024   DTaP/Tdap/Td (3 - Td or Tdap) 07/27/2032   Pneumonia Vaccine 35+ Years old  Completed   Zoster Vaccines- Shingrix  Completed   HPV VACCINES  Aged Out   Fecal DNA (Cologuard)  Discontinued    Health Maintenance  Health Maintenance Due  Topic Date Due   Hepatitis C Screening  Never done    Colorectal cancer screening: No longer required.   Mammogram status: No longer required due to age .  Bone Density status: Completed 04/26/2021. Results reflect: Bone density results: OSTEOPOROSIS. Repeat every 2 years.  Lung Cancer Screening: (Low Dose CT Chest recommended if Age 38-80 years, 30 pack-year currently smoking OR have quit w/in 15years.) does not qualify.   Lung Cancer Screening Referral: n/a  Additional Screening:  Hepatitis C Screening: does not qualify;   Vision Screening: Recommended annual ophthalmology exams for early detection of glaucoma and other disorders of the eye. Is the patient up to date with their annual eye exam?  Yes  Who is the provider or what is the name of the office in which the patient attends annual eye exams? Dr.johnson  If pt is not established with a provider, would they like to be referred to a provider to establish care? No .   Dental Screening: Recommended annual dental exams for proper oral hygiene  Community Resource Referral / Chronic Care Management: CRR required this visit?  No   CCM required this visit?  No      Plan:     I have  personally reviewed and noted the following in the patient's chart:   Medical and social history Use of alcohol, tobacco or illicit drugs  Current medications and supplements including opioid prescriptions. Patient is not currently taking opioid prescriptions. Functional ability and status Nutritional status Physical activity Advanced directives List of other physicians Hospitalizations, surgeries, and ER visits in previous 12 months Vitals Screenings to include cognitive, depression, and falls Referrals and appointments  In addition, I have reviewed and discussed with patient certain preventive protocols, quality metrics, and best practice recommendations. A written personalized care plan for preventive services as well as general preventive health recommendations were provided to patient.     Lorrene Reid, LPN   [id number]/[id number]   Nurse Notes: none

## 2023-02-22 NOTE — Patient Instructions (Signed)
Bonnie Tanner , Thank you for taking time to come for your Medicare Wellness Visit. I appreciate your ongoing commitment to your health goals. Please review the following plan we discussed and let me know if I can assist you in the future.   These are the goals we discussed:  Goals      Exercise 3x per week (30 min per time)     Walk for 30 minutes 3 times per week        This is a list of the screening recommended for you and due dates:  Health Maintenance  Topic Date Due   Hepatitis C Screening: USPSTF Recommendation to screen - Ages 29-79 yo.  Never done   COVID-19 Vaccine (3 - Moderna risk series) 02/28/2023*   Mammogram  08/17/2023*   DEXA scan (bone density measurement)  04/27/2023   Flu Shot  05/10/2023   Medicare Annual Wellness Visit  02/22/2024   DTaP/Tdap/Td vaccine (3 - Td or Tdap) 07/27/2032   Pneumonia Vaccine  Completed   Zoster (Shingles) Vaccine  Completed   HPV Vaccine  Aged Out   Cologuard (Stool DNA test)  Discontinued  *Topic was postponed. The date shown is not the original due date.    Advanced directives: Advance directive discussed with you today. I have provided a copy for you to complete at home and have notarized. Once this is complete please bring a copy in to our office so we can scan it into your chart.   Conditions/risks identified: Aim for 30 minutes of exercise or brisk walking, 6-8 glasses of water, and 5 servings of fruits and vegetables each day.   Next appointment: Follow up in one year for your annual wellness visit    Preventive Care 65 Years and Older, Female Preventive care refers to lifestyle choices and visits with your health care provider that can promote health and wellness. What does preventive care include? A yearly physical exam. This is also called an annual well check. Dental exams once or twice a year. Routine eye exams. Ask your health care provider how often you should have your eyes checked. Personal lifestyle choices,  including: Daily care of your teeth and gums. Regular physical activity. Eating a healthy diet. Avoiding tobacco and drug use. Limiting alcohol use. Practicing safe sex. Taking low-dose aspirin every day. Taking vitamin and mineral supplements as recommended by your health care provider. What happens during an annual well check? The services and screenings done by your health care provider during your annual well check will depend on your age, overall health, lifestyle risk factors, and family history of disease. Counseling  Your health care provider may ask you questions about your: Alcohol use. Tobacco use. Drug use. Emotional well-being. Home and relationship well-being. Sexual activity. Eating habits. History of falls. Memory and ability to understand (cognition). Work and work Astronomer. Reproductive health. Screening  You may have the following tests or measurements: Height, weight, and BMI. Blood pressure. Lipid and cholesterol levels. These may be checked every 5 years, or more frequently if you are over 52 years old. Skin check. Lung cancer screening. You may have this screening every year starting at age 41 if you have a 30-pack-year history of smoking and currently smoke or have quit within the past 15 years. Fecal occult blood test (FOBT) of the stool. You may have this test every year starting at age 24. Flexible sigmoidoscopy or colonoscopy. You may have a sigmoidoscopy every 5 years or a colonoscopy every 10 years  starting at age 66. Hepatitis C blood test. Hepatitis B blood test. Sexually transmitted disease (STD) testing. Diabetes screening. This is done by checking your blood sugar (glucose) after you have not eaten for a while (fasting). You may have this done every 1-3 years. Bone density scan. This is done to screen for osteoporosis. You may have this done starting at age 55. Mammogram. This may be done every 1-2 years. Talk to your health care provider  about how often you should have regular mammograms. Talk with your health care provider about your test results, treatment options, and if necessary, the need for more tests. Vaccines  Your health care provider may recommend certain vaccines, such as: Influenza vaccine. This is recommended every year. Tetanus, diphtheria, and acellular pertussis (Tdap, Td) vaccine. You may need a Td booster every 10 years. Zoster vaccine. You may need this after age 80. Pneumococcal 13-valent conjugate (PCV13) vaccine. One dose is recommended after age 69. Pneumococcal polysaccharide (PPSV23) vaccine. One dose is recommended after age 55. Talk to your health care provider about which screenings and vaccines you need and how often you need them. This information is not intended to replace advice given to you by your health care provider. Make sure you discuss any questions you have with your health care provider. Document Released: 10/22/2015 Document Revised: 06/14/2016 Document Reviewed: 07/27/2015 Elsevier Interactive Patient Education  2017 [place] Prevention in the Home Falls can cause injuries. They can happen to people of all ages. There are many things you can do to make your home safe and to help prevent falls. What can I do on the outside of my home? Regularly fix the edges of walkways and driveways and fix any cracks. Remove anything that might make you trip as you walk through a door, such as a raised step or threshold. Trim any bushes or trees on the path to your home. Use bright outdoor lighting. Clear any walking paths of anything that might make someone trip, such as rocks or tools. Regularly check to see if handrails are loose or broken. Make sure that both sides of any steps have handrails. Any raised decks and porches should have guardrails on the edges. Have any leaves, snow, or ice cleared regularly. Use sand or salt on walking paths during winter. Clean up any spills in  your garage right away. This includes oil or grease spills. What can I do in the bathroom? Use night lights. Install grab bars by the toilet and in the tub and shower. Do not use towel bars as grab bars. Use non-skid mats or decals in the tub or shower. If you need to sit down in the shower, use a plastic, non-slip stool. Keep the floor dry. Clean up any water that spills on the floor as soon as it happens. Remove soap buildup in the tub or shower regularly. Attach bath mats securely with double-sided non-slip rug tape. Do not have throw rugs and other things on the floor that can make you trip. What can I do in the bedroom? Use night lights. Make sure that you have a light by your bed that is easy to reach. Do not use any sheets or blankets that are too big for your bed. They should not hang down onto the floor. Have a firm chair that has side arms. You can use this for support while you get dressed. Do not have throw rugs and other things on the floor that can make you trip. What  can I do in the kitchen? Clean up any spills right away. Avoid walking on wet floors. Keep items that you use a lot in easy-to-reach places. If you need to reach something above you, use a strong step stool that has a grab bar. Keep electrical cords out of the way. Do not use floor polish or wax that makes floors slippery. If you must use wax, use non-skid floor wax. Do not have throw rugs and other things on the floor that can make you trip. What can I do with my stairs? Do not leave any items on the stairs. Make sure that there are handrails on both sides of the stairs and use them. Fix handrails that are broken or loose. Make sure that handrails are as long as the stairways. Check any carpeting to make sure that it is firmly attached to the stairs. Fix any carpet that is loose or worn. Avoid having throw rugs at the top or bottom of the stairs. If you do have throw rugs, attach them to the floor with carpet  tape. Make sure that you have a light switch at the top of the stairs and the bottom of the stairs. If you do not have them, ask someone to add them for you. What else can I do to help prevent falls? Wear shoes that: Do not have high heels. Have rubber bottoms. Are comfortable and fit you well. Are closed at the toe. Do not wear sandals. If you use a stepladder: Make sure that it is fully opened. Do not climb a closed stepladder. Make sure that both sides of the stepladder are locked into place. Ask someone to hold it for you, if possible. Clearly mark and make sure that you can see: Any grab bars or handrails. First and last steps. Where the edge of each step is. Use tools that help you move around (mobility aids) if they are needed. These include: Canes. Walkers. Scooters. Crutches. Turn on the lights when you go into a dark area. Replace any light bulbs as soon as they burn out. Set up your furniture so you have a clear path. Avoid moving your furniture around. If any of your floors are uneven, fix them. If there are any pets around you, be aware of where they are. Review your medicines with your doctor. Some medicines can make you feel dizzy. This can increase your chance of falling. Ask your doctor what other things that you can do to help prevent falls. This information is not intended to replace advice given to you by your health care provider. Make sure you discuss any questions you have with your health care provider. Document Released: 07/22/2009 Document Revised: 03/02/2016 Document Reviewed: 10/30/2014 Elsevier Interactive Patient Education  2017 [employer].

## 2023-04-13 ENCOUNTER — Ambulatory Visit (INDEPENDENT_AMBULATORY_CARE_PROVIDER_SITE_OTHER): Payer: Medicare Other | Admitting: Family

## 2023-04-13 ENCOUNTER — Encounter: Payer: Self-pay | Admitting: Family

## 2023-04-13 VITALS — BP 138/62 | HR 58 | Temp 97.8°F | Ht 59.0 in | Wt 145.8 lb

## 2023-04-13 DIAGNOSIS — J208 Acute bronchitis due to other specified organisms: Secondary | ICD-10-CM | POA: Diagnosis not present

## 2023-04-13 DIAGNOSIS — B9689 Other specified bacterial agents as the cause of diseases classified elsewhere: Secondary | ICD-10-CM

## 2023-04-13 MED ORDER — DOXYCYCLINE HYCLATE 100 MG PO TABS: 100.0000 mg | ORAL_TABLET | Freq: Two times a day (BID) | ORAL | 0 refills | Status: DC

## 2023-04-13 MED ORDER — BENZONATATE 200 MG PO CAPS: 200.0000 mg | ORAL_CAPSULE | Freq: Three times a day (TID) | ORAL | 1 refills | Status: DC | PRN

## 2023-04-13 NOTE — Progress Notes (Signed)
Subjective:    Patient ID: Bonnie Tanner, female    DOB: [birth year], 82 y.o.   MRN: [id number]  Chief Complaint  Patient presents with   Cough    3 WEEKS PRODUCTIVE    PT presents to the office today with a cough that started three weeks ago.  Cough This is a new problem. The current episode started 1 to 4 weeks ago. The problem has been waxing and waning. The problem occurs every few minutes. The cough is Productive of sputum. Associated symptoms include nasal congestion, postnasal drip, rhinorrhea and wheezing. Pertinent negatives include no chills, ear congestion, ear pain, fever, headaches, sore throat or shortness of breath. She has tried rest and OTC cough suppressant for the symptoms. The treatment provided mild relief.      Review of Systems  Constitutional:  Negative for chills and fever.  HENT:  Positive for postnasal drip and rhinorrhea. Negative for ear pain and sore throat.   Respiratory:  Positive for cough and wheezing. Negative for shortness of breath.   Neurological:  Negative for headaches.  All other systems reviewed and are negative.      Objective:   Physical Exam Vitals reviewed.  Constitutional:      General: She is not in acute distress.    Appearance: She is well-developed.  HENT:     Head: Normocephalic and atraumatic.  Eyes:     Pupils: Pupils are equal, round, and reactive to light.  Neck:     Thyroid: No thyromegaly.  Cardiovascular:     Rate and Rhythm: Normal rate and regular rhythm.     Heart sounds: Normal heart sounds. No murmur heard. Pulmonary:     Effort: Pulmonary effort is normal. No respiratory distress.     Breath sounds: Rhonchi present. No wheezing.     Comments: Nonproductive coarse cough Abdominal:     General: Bowel sounds are normal. There is no distension.     Palpations: Abdomen is soft.     Tenderness: There is no abdominal tenderness.  Musculoskeletal:        General: No tenderness. Normal range of motion.      Cervical back: Normal range of motion and neck supple.  Skin:    General: Skin is warm and dry.  Neurological:     Mental Status: She is alert and oriented to person, place, and time.     Cranial Nerves: No cranial nerve deficit.     Deep Tendon Reflexes: Reflexes are normal and symmetric.  Psychiatric:        Behavior: Behavior normal.        Thought Content: Thought content normal.        Judgment: Judgment normal.     BP 138/62   Pulse (!) 58   Temp 97.8 F (36.6 C) (Temporal)   Ht 4\' 11"  (1.499 m)   Wt 145 lb 12.8 oz (66.1 kg)   SpO2 95%   BMI 29.45 kg/m        Assessment & Plan:  Bonnie Tanner comes in today with chief complaint of Cough (3 WEEKS PRODUCTIVE )   Diagnosis and orders addressed:  1. Acute bacterial bronchitis - Take meds as prescribed - Use a cool mist humidifier  -Use saline nose sprays frequently -Force fluids -For any cough or congestion  Use plain Mucinex- regular strength or max strength is fine -For fever or aces or pains- take tylenol or ibuprofen. -Throat lozenges if help -Follow up if symptoms worsen  or do not improve  - doxycycline (VIBRA-TABS) 100 MG tablet; Take 1 tablet (100 mg total) by mouth 2 (two) times daily.  Dispense: 20 tablet; Refill: 0 - benzonatate (TESSALON) 200 MG capsule; Take 1 capsule (200 mg total) by mouth 3 (three) times daily as needed.  Dispense: 30 capsule; Refill: 1  Bonnie Rodney, FNP

## 2023-04-13 NOTE — Patient Instructions (Signed)

## 2023-07-09 DIAGNOSIS — H35372 Puckering of macula, left eye: Secondary | ICD-10-CM | POA: Diagnosis not present

## 2023-07-09 DIAGNOSIS — H35033 Hypertensive retinopathy, bilateral: Secondary | ICD-10-CM | POA: Diagnosis not present

## 2023-08-06 ENCOUNTER — Telehealth: Payer: Self-pay | Admitting: Nurse Practitioner

## 2023-08-06 NOTE — Telephone Encounter (Signed)
VO given to change manufacturer's

## 2023-08-13 ENCOUNTER — Ambulatory Visit (INDEPENDENT_AMBULATORY_CARE_PROVIDER_SITE_OTHER): Payer: Medicare Other | Admitting: Nurse Practitioner

## 2023-08-13 ENCOUNTER — Encounter: Payer: Self-pay | Admitting: Nurse Practitioner

## 2023-08-13 VITALS — BP 179/73 | HR 57 | Temp 98.5°F | Resp 20 | Ht 59.0 in | Wt 145.0 lb

## 2023-08-13 DIAGNOSIS — K219 Gastro-esophageal reflux disease without esophagitis: Secondary | ICD-10-CM | POA: Diagnosis not present

## 2023-08-13 DIAGNOSIS — I2583 Coronary atherosclerosis due to lipid rich plaque: Secondary | ICD-10-CM | POA: Diagnosis not present

## 2023-08-13 DIAGNOSIS — Z170 Estrogen receptor positive status [ER+]: Secondary | ICD-10-CM

## 2023-08-13 DIAGNOSIS — I2510 Atherosclerotic heart disease of native coronary artery without angina pectoris: Secondary | ICD-10-CM | POA: Diagnosis not present

## 2023-08-13 DIAGNOSIS — C50911 Malignant neoplasm of unspecified site of right female breast: Secondary | ICD-10-CM

## 2023-08-13 DIAGNOSIS — Z23 Encounter for immunization: Secondary | ICD-10-CM

## 2023-08-13 DIAGNOSIS — I10 Essential (primary) hypertension: Secondary | ICD-10-CM

## 2023-08-13 DIAGNOSIS — E039 Hypothyroidism, unspecified: Secondary | ICD-10-CM

## 2023-08-13 DIAGNOSIS — E785 Hyperlipidemia, unspecified: Secondary | ICD-10-CM

## 2023-08-13 DIAGNOSIS — Z6832 Body mass index (BMI) 32.0-32.9, adult: Secondary | ICD-10-CM

## 2023-08-13 MED ORDER — OMEPRAZOLE 40 MG PO CPDR: 40.0000 mg | DELAYED_RELEASE_CAPSULE | Freq: Every day | ORAL | 1 refills | Status: DC

## 2023-08-13 MED ORDER — VERAPAMIL HCL 120 MG PO TABS: 120.0000 mg | ORAL_TABLET | Freq: Every day | ORAL | 1 refills | Status: DC

## 2023-08-13 MED ORDER — LISINOPRIL 40 MG PO TABS: 40.0000 mg | ORAL_TABLET | Freq: Every day | ORAL | 1 refills | Status: DC

## 2023-08-13 MED ORDER — LEVOTHYROXINE SODIUM 88 MCG PO TABS: 88.0000 ug | ORAL_TABLET | Freq: Every day | ORAL | 1 refills | Status: DC

## 2023-08-13 MED ORDER — ATORVASTATIN CALCIUM 40 MG PO TABS: 40.0000 mg | ORAL_TABLET | Freq: Every day | ORAL | 1 refills | Status: DC

## 2023-08-13 MED ORDER — AMLODIPINE BESYLATE 5 MG PO TABS: 5.0000 mg | ORAL_TABLET | Freq: Every day | ORAL | 1 refills | Status: DC

## 2023-08-13 MED ORDER — CLONIDINE HCL 0.1 MG PO TABS: 0.1000 mg | ORAL_TABLET | Freq: Two times a day (BID) | ORAL | 1 refills | Status: DC

## 2023-08-13 NOTE — Progress Notes (Addendum)
Subjective:    Patient ID: Bonnie Tanner, female    DOB: 05/06/1941, 82 y.o.   MRN: [id number]   Chief Complaint: medical management of chronic issues     HPI:  Bonnie Tanner is a 82 y.o. who identifies as a female who was assigned female at birth.   Social history: Lives with: husband Work history: hairdresser   Comes in today for follow up of the following chronic medical issues:  1. ESSENTIAL HYPERTENSION, BENIGN No c/o chest pain, sob or headache. Does not check blood pressure at home. BP Readings from Last 3 Encounters:  04/13/23 138/62  02/12/23 (!) 142/78  11/28/22 (!) 157/76     2. Hyperlipidemia LDL goal <100 Does try to watch diet but does no dedicated exercise. Lab Results  Component Value Date   CHOL 152 02/12/2023   HDL 62 02/12/2023   LDLCALC 73 02/12/2023   TRIG 92 02/12/2023   CHOLHDL 2.5 02/12/2023     3. Acquired hypothyroidism No issues  that she is aware of. Lab Results  Component Value Date   TSH 3.100 02/12/2023     4. Coronary artery disease due to lipid rich plaque Does not see cardiology.  5. Gastroesophageal reflux disease without esophagitis Is on omerpazole and is doing well.   6. Malignant neoplasm of right breast in female, estrogen receptor positive, unspecified site of breast (HCC) Had breast cancer in 2012. Has had no reoccurrence.  7. BMI 32.0-32.9,adult No recent weight changes. Wt Readings from Last 3 Encounters:  08/13/23 145 lb (65.8 kg)  04/13/23 145 lb 12.8 oz (66.1 kg)  02/22/23 148 lb (67.1 kg)   BMI Readings from Last 3 Encounters:  08/13/23 29.29 kg/m  04/13/23 29.45 kg/m  02/22/23 29.89 kg/m      New complaints: None  today  Allergies  Allergen Reactions   Erythromycin Swelling   Latex Other (See Comments)    Skin gets real red   Nickel     Other reaction(s): Other (See Comments) blisters   Penicillins Hives   Sulfonamide Derivatives Rash   Outpatient Encounter Medications as of  08/13/2023  Medication Sig   aspirin 325 MG tablet Take 325 mg by mouth daily.   atorvastatin (LIPITOR) 40 MG tablet Take 1 tablet (40 mg total) by mouth daily.   benzonatate (TESSALON) 200 MG capsule Take 1 capsule (200 mg total) by mouth 3 (three) times daily as needed.   cholecalciferol (VITAMIN D) 1000 UNITS tablet Take 1,000 Units by mouth daily.   doxycycline (VIBRA-TABS) 100 MG tablet Take 1 tablet (100 mg total) by mouth 2 (two) times daily.   Glucosamine-Chondroit-Vit C-Mn (GLUCOSAMINE 1500 COMPLEX PO) Take by mouth.   levothyroxine (SYNTHROID) 88 MCG tablet Take 1 tablet (88 mcg total) by mouth daily before breakfast.   lisinopril (ZESTRIL) 40 MG tablet Take 1 tablet (40 mg total) by mouth daily.   multivitamin-iron-minerals-folic acid (CENTRUM) chewable tablet Chew 1 tablet by mouth daily.   nystatin cream (MYCOSTATIN) Apply 1 application topically 2 (two) times daily.   omeprazole (PRILOSEC) 40 MG capsule Take 1 capsule (40 mg total) by mouth daily.   trimethoprim-polymyxin b (POLYTRIM) ophthalmic solution Place 1 drop into the left eye every 6 (six) hours.   verapamil (CALAN) 120 MG tablet Take 1 tablet (120 mg total) by mouth daily.   No facility-administered encounter medications on file as of 08/13/2023.    Past Surgical History:  Procedure Laterality Date   BREAST LUMPECTOMY  04/19/2010  radiation therapy right breast   CARDIAC CATHETERIZATION  09/2009   CATARACT EXTRACTION  2006   bilateral   EYE SURGERY  2000   made her ducts    Family History  Problem Relation Age of Onset   Heart disease Mother 44   Emphysema Father    Heart disease Father    Throat cancer Brother    Stroke Brother    Healthy Son    Prostate cancer Paternal Uncle    Stomach cancer Paternal Uncle       Controlled substance contract: n/a     Review of Systems  Constitutional:  Negative for diaphoresis.  Eyes:  Negative for pain.  Respiratory:  Negative for shortness of breath.    Cardiovascular:  Negative for chest pain, palpitations and leg swelling.  Gastrointestinal:  Negative for abdominal pain.  Endocrine: Negative for polydipsia.  Skin:  Negative for rash.  Neurological:  Negative for dizziness, weakness and headaches.  Hematological:  Does not bruise/bleed easily.  All other systems reviewed and are negative.      Objective:   Physical Exam Vitals and nursing note reviewed.  Constitutional:      General: She is not in acute distress.    Appearance: Normal appearance. She is well-developed.  HENT:     Head: Normocephalic.     Right Ear: Tympanic membrane normal.     Left Ear: Tympanic membrane normal.     Nose: Nose normal.     Mouth/Throat:     Mouth: Mucous membranes are moist.  Eyes:     Pupils: Pupils are equal, round, and reactive to light.  Neck:     Vascular: No carotid bruit or JVD.  Cardiovascular:     Rate and Rhythm: Normal rate and regular rhythm.     Heart sounds: Normal heart sounds.  Pulmonary:     Effort: Pulmonary effort is normal. No respiratory distress.     Breath sounds: Normal breath sounds. No wheezing or rales.  Chest:     Chest wall: No tenderness.  Abdominal:     General: Bowel sounds are normal. There is no distension or abdominal bruit.     Palpations: Abdomen is soft. There is no hepatomegaly, splenomegaly, mass or pulsatile mass.     Tenderness: There is no abdominal tenderness.  Musculoskeletal:        General: Normal range of motion.     Cervical back: Normal range of motion and neck supple.  Lymphadenopathy:     Cervical: No cervical adenopathy.  Skin:    General: Skin is warm and dry.  Neurological:     Mental Status: She is alert and oriented to person, place, and time.     Deep Tendon Reflexes: Reflexes are normal and symmetric.  Psychiatric:        Behavior: Behavior normal.        Thought Content: Thought content normal.        Judgment: Judgment normal.     BP (!) 179/73   Pulse (!) 57    Temp 98.5 F (36.9 C) (Temporal)   Resp 20   Ht 4\' 11"  (1.499 m)   Wt 145 lb (65.8 kg)   SpO2 97%   BMI 29.29 kg/m         Assessment & Plan:   Bonnie Tanner comes in today with chief complaint of Medical Management of Chronic Issues   Diagnosis and orders addressed:  1. ESSENTIAL HYPERTENSION, BENIGN Low sodium diet Added clonidine  0.1mg  bid - lisinopril (ZESTRIL) 40 MG tablet; Take 1 tablet (40 mg total) by mouth daily.  Dispense: 90 tablet; Refill: 1 - CBC with Differential/Platelet - CMP14+EGFR  2. Hyperlipidemia LDL goal <100 Low fat diet - atorvastatin (LIPITOR) 40 MG tablet; Take 1 tablet (40 mg total) by mouth daily.  Dispense: 90 tablet; Refill: 1 - Lipid panel  3. Acquired hypothyroidism Labs pending - levothyroxine (SYNTHROID) 88 MCG tablet; Take 1 tablet (88 mcg total) by mouth daily before breakfast.  Dispense: 90 tablet; Refill: 1 - Thyroid Panel With TSH  4. Coronary artery disease due to lipid rich plaque - verapamil (CALAN) 120 MG tablet; Take 1 tablet (120 mg total) by mouth daily.  Dispense: 90 tablet; Refill: 1  5. Gastroesophageal reflux disease without esophagitis Avoid spicy foods Do not eat 2 hours prior to bedtime - omeprazole (PRILOSEC) 40 MG capsule; Take 1 capsule (40 mg total) by mouth daily.  Dispense: 90 capsule; Refill: 1  6. Malignant neoplasm of right breast in female, estrogen receptor positive, unspecified site of breast (HCC)   7. BMI 32.0-32.9,adult Discussed diet and exercise for person with BMI >25 Will recheck weight in 3-6 months    Labs pending Health Maintenance reviewed Diet and exercise encouraged  Follow up plan: 6 months   Mary-Margaret Daphine Deutscher, FNP

## 2023-08-13 NOTE — Patient Instructions (Signed)
Fall Prevention in the Home, Adult Falls can cause injuries and can happen to people of all ages. There are many things you can do to make your home safer and to help prevent falls. What actions can I take to prevent falls? General information Use good lighting in all rooms. Make sure to: Replace any light bulbs that burn out. Turn on the lights in dark areas and use night-lights. Keep items that you use often in easy-to-reach places. Lower the shelves around your home if needed. Move furniture so that there are clear paths around it. Do not use throw rugs or other things on the floor that can make you trip. If any of your floors are uneven, fix them. Add color or contrast paint or tape to clearly mark and help you see: Grab bars or handrails. First and last steps of staircases. Where the edge of each step is. If you use a ladder or stepladder: Make sure that it is fully opened. Do not climb a closed ladder. Make sure the sides of the ladder are locked in place. Have someone hold the ladder while you use it. Know where your pets are as you move through your home. What can I do in the bathroom?     Keep the floor dry. Clean up any water on the floor right away. Remove soap buildup in the bathtub or shower. Buildup makes bathtubs and showers slippery. Use non-skid mats or decals on the floor of the bathtub or shower. Attach bath mats securely with double-sided, non-slip rug tape. If you need to sit down in the shower, use a non-slip stool. Install grab bars by the toilet and in the bathtub and shower. Do not use towel bars as grab bars. What can I do in the bedroom? Make sure that you have a light by your bed that is easy to reach. Do not use any sheets or blankets on your bed that hang to the floor. Have a firm chair or bench with side arms that you can use for support when you get dressed. What can I do in the kitchen? Clean up any spills right away. If you need to reach something  above you, use a step stool with a grab bar. Keep electrical cords out of the way. Do not use floor polish or wax that makes floors slippery. What can I do with my stairs? Do not leave anything on the stairs. Make sure that you have a light switch at the top and the bottom of the stairs. Make sure that there are handrails on both sides of the stairs. Fix handrails that are broken or loose. Install non-slip stair treads on all your stairs if they do not have carpet. Avoid having throw rugs at the top or bottom of the stairs. Choose a carpet that does not hide the edge of the steps on the stairs. Make sure that the carpet is firmly attached to the stairs. Fix carpet that is loose or worn. What can I do on the outside of my home? Use bright outdoor lighting. Fix the edges of walkways and driveways and fix any cracks. Clear paths of anything that can make you trip, such as tools or rocks. Add color or contrast paint or tape to clearly mark and help you see anything that might make you trip as you walk through a door, such as a raised step or threshold. Trim any bushes or trees on paths to your home. Check to see if handrails are loose   or broken and that both sides of all steps have handrails. Install guardrails along the edges of any raised decks and porches. Have leaves, snow, or ice cleared regularly. Use sand, salt, or ice melter on paths if you live where there is ice and snow during the winter. Clean up any spills in your garage right away. This includes grease or oil spills. What other actions can I take? Review your medicines with your doctor. Some medicines can cause dizziness or changes in blood pressure, which increase your risk of falling. Wear shoes that: Have a low heel. Do not wear high heels. Have rubber bottoms and are closed at the toe. Feel good on your feet and fit well. Use tools that help you move around if needed. These include: Canes. Walkers. Scooters. Crutches. Ask  your doctor what else you can do to help prevent falls. This may include seeing a physical therapist to learn to do exercises to move better and get stronger. Where to find more information Centers for Disease Control and Prevention, [employer]: cdc.gov National Institute on Aging: nia.nih.gov National Institute on Aging: nia.nih.gov Contact a doctor if: You are afraid of falling at home. You feel weak, drowsy, or dizzy at home. You fall at home. Get help right away if you: Lose consciousness or have trouble moving after a fall. Have a fall that causes a head injury. These symptoms may be an emergency. Get help right away. Call 911. Do not wait to see if the symptoms will go away. Do not drive yourself to the hospital. This information is not intended to replace advice given to you by your health care provider. Make sure you discuss any questions you have with your health care provider. Document Revised: 05/29/2022 Document Reviewed: 05/29/2022 Elsevier Patient Education  2024 [employer]  

## 2023-08-14 LAB — CBC WITH DIFFERENTIAL/PLATELET
Basophils Absolute: 0.1 10*3/uL (ref 0.0–0.2)
Basos: 1 %
EOS (ABSOLUTE): 0.2 10*3/uL (ref 0.0–0.4)
Eos: 3 %
Hematocrit: 41.1 % (ref 34.0–46.6)
Hemoglobin: 13.7 g/dL (ref 11.1–15.9)
Immature Grans (Abs): 0 10*3/uL (ref 0.0–0.1)
Immature Granulocytes: 0 %
Lymphocytes Absolute: 2.3 10*3/uL (ref 0.7–3.1)
Lymphs: 37 %
MCH: 34.4 pg — ABNORMAL HIGH (ref 26.6–33.0)
MCHC: 33.3 g/dL (ref 31.5–35.7)
MCV: 103 fL — ABNORMAL HIGH (ref 79–97)
Monocytes Absolute: 0.4 10*3/uL (ref 0.1–0.9)
Monocytes: 7 %
Neutrophils Absolute: 3.2 10*3/uL (ref 1.4–7.0)
Neutrophils: 52 %
Platelets: 239 10*3/uL (ref 150–450)
RBC: 3.98 x10E6/uL (ref 3.77–5.28)
RDW: 12 % (ref 11.7–15.4)
WBC: 6.2 10*3/uL (ref 3.4–10.8)

## 2023-08-14 LAB — CMP14+EGFR
ALT: 35 [IU]/L — ABNORMAL HIGH (ref 0–32)
AST: 32 [IU]/L (ref 0–40)
Albumin: 4.4 g/dL (ref 3.8–4.8)
Alkaline Phosphatase: 163 [IU]/L — ABNORMAL HIGH (ref 44–121)
BUN/Creatinine Ratio: 21 (ref 12–28)
BUN: 14 mg/dL (ref 8–27)
Bilirubin Total: 0.3 mg/dL (ref 0.0–1.2)
CO2: 24 mmol/L (ref 20–29)
Calcium: 10.6 mg/dL — ABNORMAL HIGH (ref 8.7–10.3)
Chloride: 103 mmol/L (ref 96–106)
Creatinine, Ser: 0.68 mg/dL (ref 0.57–1.00)
Globulin, Total: 2.5 g/dL (ref 1.5–4.5)
Glucose: 89 mg/dL (ref 70–99)
Potassium: 4.6 mmol/L (ref 3.5–5.2)
Sodium: 143 mmol/L (ref 134–144)
Total Protein: 6.9 g/dL (ref 6.0–8.5)
eGFR: 88 mL/min/{1.73_m2} (ref >59)

## 2023-08-14 LAB — LIPID PANEL
Chol/HDL Ratio: 2.3 ratio (ref 0.0–4.4)
Cholesterol, Total: 167 mg/dL (ref 100–199)
HDL: 72 mg/dL (ref >39)
LDL Chol Calc (NIH): 78 mg/dL (ref 0–99)
Triglycerides: 97 mg/dL (ref 0–149)
VLDL Cholesterol Cal: 17 mg/dL (ref 5–40)

## 2023-08-14 LAB — THYROID PANEL WITH TSH
Free Thyroxine Index: 3 (ref 1.2–4.9)
T3 Uptake Ratio: 29 % (ref 24–39)
T4, Total: 10.5 ug/dL (ref 4.5–12.0)
TSH: 3.08 u[IU]/mL (ref 0.450–4.500)

## 2023-08-27 ENCOUNTER — Encounter: Payer: Self-pay | Admitting: Nurse Practitioner

## 2023-08-27 ENCOUNTER — Ambulatory Visit: Payer: Medicare Other | Admitting: Nurse Practitioner

## 2023-08-27 VITALS — BP 173/63 | HR 82 | Temp 98.7°F | Ht 59.0 in | Wt 148.0 lb

## 2023-08-27 DIAGNOSIS — M545 Low back pain, unspecified: Secondary | ICD-10-CM

## 2023-08-27 DIAGNOSIS — I10 Essential (primary) hypertension: Secondary | ICD-10-CM | POA: Diagnosis not present

## 2023-08-27 MED ORDER — AMLODIPINE BESYLATE 5 MG PO TABS: 5.0000 mg | ORAL_TABLET | Freq: Every day | ORAL | 1 refills | Status: DC

## 2023-08-27 MED ORDER — METHYLPREDNISOLONE ACETATE 80 MG/ML IJ SUSP
80.0000 mg | Freq: Once | INTRAMUSCULAR | Status: AC
  Administered 2023-08-27: 80 mg via INTRAMUSCULAR

## 2023-08-27 MED ORDER — KETOROLAC TROMETHAMINE 60 MG/2ML IM SOLN
60.0000 mg | Freq: Once | INTRAMUSCULAR | Status: AC
  Administered 2023-08-27: 60 mg via INTRAMUSCULAR

## 2023-08-27 NOTE — Patient Instructions (Signed)
Acute Back Pain, Adult Acute back pain is sudden and usually short-lived. It is often caused by an injury to the muscles and tissues in the back. The injury may result from: A muscle, tendon, or ligament getting overstretched or torn. Ligaments are tissues that connect bones to each other. Lifting something improperly can cause a back strain. Wear and tear (degeneration) of the spinal disks. Spinal disks are circular tissue that provide cushioning between the bones of the spine (vertebrae). Twisting motions, such as while playing sports or doing yard work. A hit to the back. Arthritis. You may have a physical exam, lab tests, and imaging tests to find the cause of your pain. Acute back pain usually goes away with rest and home care. Follow these instructions at home: Managing pain, stiffness, and swelling Take over-the-counter and prescription medicines only as told by your health care provider. Treatment may include medicines for pain and inflammation that are taken by mouth or applied to the skin, or muscle relaxants. Your health care provider may recommend applying ice during the first 24-48 hours after your pain starts. To do this: Put ice in a plastic bag. Place a towel between your skin and the bag. Leave the ice on for 20 minutes, 2-3 times a day. Remove the ice if your skin turns bright red. This is very important. If you cannot feel pain, heat, or cold, you have a greater risk of damage to the area. If directed, apply heat to the affected area as often as told by your health care provider. Use the heat source that your health care provider recommends, such as a moist heat pack or a heating pad. Place a towel between your skin and the heat source. Leave the heat on for 20-30 minutes. Remove the heat if your skin turns bright red. This is especially important if you are unable to feel pain, heat, or cold. You have a greater risk of getting burned. Activity  Do not stay in bed. Staying in  bed for more than 1-2 days can delay your recovery. Sit up and stand up straight. Avoid leaning forward when you sit or hunching over when you stand. If you work at a desk, sit close to it so you do not need to lean over. Keep your chin tucked in. Keep your neck drawn back, and keep your elbows bent at a 90-degree angle (right angle). Sit high and close to the steering wheel when you drive. Add lower back (lumbar) support to your car seat, if needed. Take short walks on even surfaces as soon as you are able. Try to increase the length of time you walk each day. Do not sit, drive, or stand in one place for more than 30 minutes at a time. Sitting or standing for long periods of time can put stress on your back. Do not drive or use heavy machinery while taking prescription pain medicine. Use proper lifting techniques. When you bend and lift, use positions that put less stress on your back: Bend your knees. Keep the load close to your body. Avoid twisting. Exercise regularly as told by your health care provider. Exercising helps your back heal faster and helps prevent back injuries by keeping muscles strong and flexible. Work with a physical therapist to make a safe exercise program, as recommended by your health care provider. Do any exercises as told by your physical therapist. Lifestyle Maintain a healthy weight. Extra weight puts stress on your back and makes it difficult to have good   posture. Avoid activities or situations that make you feel anxious or stressed. Stress and anxiety increase muscle tension and can make back pain worse. Learn ways to manage anxiety and stress, such as through exercise. General instructions Sleep on a firm mattress in a comfortable position. Try lying on your side with your knees slightly bent. If you lie on your back, put a pillow under your knees. Keep your head and neck in a straight line with your spine (neutral position) when using electronic equipment like  smartphones or pads. To do this: Raise your smartphone or pad to look at it instead of bending your head or neck to look down. Put the smartphone or pad at the level of your face while looking at the screen. Follow your treatment plan as told by your health care provider. This may include: Cognitive or behavioral therapy. Acupuncture or massage therapy. Meditation or yoga. Contact a health care provider if: You have pain that is not relieved with rest or medicine. You have increasing pain going down into your legs or buttocks. Your pain does not improve after 2 weeks. You have pain at night. You lose weight without trying. You have a fever or chills. You develop nausea or vomiting. You develop abdominal pain. Get help right away if: You develop new bowel or bladder control problems. You have unusual weakness or numbness in your arms or legs. You feel faint. These symptoms may represent a serious problem that is an emergency. Do not wait to see if the symptoms will go away. Get medical help right away. Call your local emergency services (911 in the [place] Do not drive yourself to the hospital. Summary Acute back pain is sudden and usually short-lived. Use proper lifting techniques. When you bend and lift, use positions that put less stress on your back. Take over-the-counter and prescription medicines only as told by your health care provider, and apply heat or ice as told. This information is not intended to replace advice given to you by your health care provider. Make sure you discuss any questions you have with your health care provider. Document Revised: 12/17/2020 Document Reviewed: 12/17/2020 Elsevier Patient Education  2024 [employer]  

## 2023-08-27 NOTE — Progress Notes (Signed)
Subjective:    Patient ID: Bonnie Tanner, female    DOB: Jul 20, 1941, 82 y.o.   MRN: [id number]   Chief Complaint: back pain  Back Pain This is a new problem. The current episode started more than 1 year ago. The problem occurs intermittently. The problem has been waxing and waning since onset. The pain is present in the lumbar spine. The quality of the pain is described as aching and shooting. The pain does not radiate. The pain is at a severity of 9/10. The pain is moderate. The pain is The same all the time. The symptoms are aggravated by standing, twisting, position and bending. Stiffness is present All day. Pertinent negatives include no abdominal pain, chest pain, headaches, perianal numbness, tingling or weakness. She has tried NSAIDs for the symptoms. The treatment provided mild relief.     Patient Active Problem List   Diagnosis Date Noted   BMI 32.0-32.9,adult 05/31/2015   GERD (gastroesophageal reflux disease) 07/13/2014   Breast cancer (HCC) 12/24/2010   Hyperlipidemia LDL goal <100 12/08/2009   ESSENTIAL HYPERTENSION, BENIGN 12/08/2009   CAD (coronary artery disease) 12/08/2009   Hypothyroidism 12/07/2009      Review of Systems  Constitutional:  Negative for diaphoresis.  Eyes:  Negative for pain.  Respiratory:  Negative for shortness of breath.   Cardiovascular:  Negative for chest pain, palpitations and leg swelling.  Gastrointestinal:  Negative for abdominal pain.  Endocrine: Negative for polydipsia.  Musculoskeletal:  Positive for back pain.  Skin:  Negative for rash.  Neurological:  Negative for dizziness, tingling, weakness and headaches.  Hematological:  Does not bruise/bleed easily.  All other systems reviewed and are negative.      Objective:   Physical Exam Constitutional:      Appearance: Normal appearance.  Cardiovascular:     Rate and Rhythm: Normal rate and regular rhythm.     Heart sounds: Normal heart sounds.  Pulmonary:     Breath  sounds: Normal breath sounds.  Skin:    General: Skin is warm.  Neurological:     General: No focal deficit present.     Mental Status: She is alert and oriented to person, place, and time.  Psychiatric:        Mood and Affect: Mood normal.        Behavior: Behavior normal.     BP (!) 173/63   Pulse 82   Temp 98.7 F (37.1 C)   Ht 4\' 11"  (1.499 m)   Wt 148 lb (67.1 kg)   SpO2 92%   BMI 29.89 kg/m        Assessment & Plan:   [name] in today with chief complaint of Back Pain (Pain no better )   1. Acute midline low back pain without sciatica Moist heat 'rest - ketorolac (TORADOL) injection 60 mg - methylPREDNISolone acetate (DEPO-MEDROL) injection 80 mg  2. Essential hypertension, benign Added norvasc Report any swelling Keep diary of blood pressure at home - amLODipine (NORVASC) 5 MG tablet; Take 1 tablet (5 mg total) by mouth daily.  Dispense: 90 tablet; Refill: 1    The above assessment and management plan was discussed with the patient. The patient verbalized understanding of and has agreed to the management plan. Patient is aware to call the clinic if symptoms persist or worsen. Patient is aware when to return to the clinic for a follow-up visit. Patient educated on when it is appropriate to go to the emergency department.  Mary-Margaret Daphine Deutscher, FNP

## 2023-09-11 ENCOUNTER — Ambulatory Visit: Payer: Self-pay | Admitting: Nurse Practitioner

## 2023-09-11 ENCOUNTER — Telehealth: Payer: Self-pay | Admitting: Nurse Practitioner

## 2023-09-11 NOTE — Telephone Encounter (Signed)
Copied from CRM [id number]. Topic: Clinical - Red Word Triage >> Sep 11, 2023  8:48 AM Dondra Prader A wrote: Red Word that prompted transfer to Nurse Triage: Pt stated she had tingling in both arms this morning and she felt like her heart was fluttering.

## 2023-09-11 NOTE — Telephone Encounter (Addendum)
Copied from CRM [id number]. Topic: Clinical - Red Word Triage >> Sep 11, 2023  8:48 AM Dondra Prader A wrote: Red Word that prompted transfer to Nurse Triage: Pt stated she had tingling in both arms this morning and she felt like her heart was fluttering.  Chief Complaint: numbness and tingling to bilateral arms, heart fluttering Symptoms: bilateral numbness and tingling that radiated to hands Frequency: happened one time at 1 am today Pertinent Negatives: Patient denies dizziness, weakness, fatigue, headache Disposition: [] ED /[] Urgent Care (no appt availability in office) / [x] Appointment(In office/virtual)/ []  [facility]/ [] Home Care/ [] Refused Recommended Disposition /[] [facility]/ []  Follow-up with PCP Additional Notes: Patient called today with concerns of numbness and tingling to bilateral arms.  Started with the left arm and radiated to hand then started in right arm and did the same.  Denies having this before.  Denies laying on arms differently during sleep and denies injuries. Apt. Made for see pcp tomorrow.  Instructed patient to go to er or uc if becomes worse or happens again.  Reason for Disposition  [1] Numbness or tingling on both sides of body AND [2] is a new symptom present > 24 hours  Answer Assessment - Initial Assessment Questions 1. SYMPTOM: "What is the main symptom you are concerned about?" (e.g., weakness, numbness)     Numbness and tingling to bilateral arms 2. ONSET: "When did this start?" (minutes, hours, days; while sleeping)     1 am this morning 3. LAST NORMAL: "When was the last time you (the patient) were normal (no symptoms)?"     It stayed for a bit and went away 4. PATTERN "Does this come and go, or has it been constant since it started?"  "Is it present now?"     Only happened once 5. CARDIAC SYMPTOMS: "Have you had any of the following symptoms: chest pain, difficulty breathing, palpitations?"     denies 6. NEUROLOGIC SYMPTOMS: "Have  you had any of the following symptoms: headache, dizziness, vision loss, double vision, changes in speech, unsteady on your feet?"     denies 7. OTHER SYMPTOMS: "Do you have any other symptoms?"     denies  Protocols used: Neurologic Deficit-A-AH

## 2023-09-12 ENCOUNTER — Ambulatory Visit (INDEPENDENT_AMBULATORY_CARE_PROVIDER_SITE_OTHER): Payer: Medicare Other | Admitting: Family Medicine

## 2023-09-12 ENCOUNTER — Encounter: Payer: Self-pay | Admitting: Family Medicine

## 2023-09-12 VITALS — BP 136/63 | HR 52 | Temp 97.5°F | Ht 59.0 in | Wt 140.8 lb

## 2023-09-12 DIAGNOSIS — I2583 Coronary atherosclerosis due to lipid rich plaque: Secondary | ICD-10-CM | POA: Diagnosis not present

## 2023-09-12 DIAGNOSIS — E785 Hyperlipidemia, unspecified: Secondary | ICD-10-CM

## 2023-09-12 DIAGNOSIS — I209 Angina pectoris, unspecified: Secondary | ICD-10-CM

## 2023-09-12 DIAGNOSIS — I10 Essential (primary) hypertension: Secondary | ICD-10-CM

## 2023-09-12 DIAGNOSIS — I2510 Atherosclerotic heart disease of native coronary artery without angina pectoris: Secondary | ICD-10-CM

## 2023-09-12 NOTE — Progress Notes (Signed)
Subjective:  Patient ID: ZAMARA QUIROGA, female    DOB: April 01, 1941  Age: 82 y.o. MRN: [id number]  CC: No chief complaint on file.   HPI VIRA BILSON presents for left arm tingling, then right arm did the same. Lasted 15 min. Two nights ago. Heart was fluttering. Symptoms described as mild. Self-limited. No recurrence. Not affected by activity     09/12/2023   11:21 AM 08/27/2023   10:03 AM 08/13/2023   10:30 AM  Depression screen PHQ 2/9  Decreased Interest 0 0 0  Down, Depressed, Hopeless 0 0 0  PHQ - 2 Score 0 0 0  Altered sleeping  0 1  Tired, decreased energy  0 0  Change in appetite  0 0  Feeling bad or failure about yourself   0 0  Trouble concentrating  0 0  Moving slowly or fidgety/restless  0 0  Suicidal thoughts  0 0  PHQ-9 Score  0 1  Difficult doing work/chores  Not difficult at all Not difficult at all    History Michalene has a past medical history of Arthritis, CAD (coronary artery disease), Cancer (HCC) (2011), GERD (gastroesophageal reflux disease), Heart murmur, Hyperlipidemia, Hypertension, and Hypothyroidism.   She has a past surgical history that includes Cardiac catheterization (09/2009); Cataract extraction (2006); Breast lumpectomy (04/19/2010); and Eye surgery (2000).   Her family history includes Emphysema in her father; Healthy in her son; Heart disease in her father; Heart disease (age of onset: 5) in her mother; Prostate cancer in her paternal uncle; Stomach cancer in her paternal uncle; Stroke in her brother; Throat cancer in her brother.She reports that she has never smoked. She has never used smokeless tobacco. She reports that she does not drink alcohol and does not use drugs.    ROS Review of Systems  Constitutional: Negative.   HENT: Negative.    Eyes:  Negative for visual disturbance.  Respiratory:  Negative for shortness of breath.   Cardiovascular:  Negative for chest pain.  Gastrointestinal:  Negative for abdominal pain.   Musculoskeletal:  Negative for arthralgias.    Objective:  BP 136/63   Pulse (!) 52   Temp (!) 97.5 F (36.4 C)   Ht 4\' 11"  (1.499 m)   Wt 140 lb 12.8 oz (63.9 kg)   SpO2 97%   BMI 28.44 kg/m   BP Readings from Last 3 Encounters:  09/12/23 136/63  08/27/23 (!) 173/63  08/13/23 (!) 179/73    Wt Readings from Last 3 Encounters:  09/12/23 140 lb 12.8 oz (63.9 kg)  08/27/23 148 lb (67.1 kg)  08/13/23 145 lb (65.8 kg)     Physical Exam Constitutional:      General: She is not in acute distress.    Appearance: She is well-developed.  HENT:     Head: Normocephalic and atraumatic.  Eyes:     Conjunctiva/sclera: Conjunctivae normal.     Pupils: Pupils are equal, round, and reactive to light.  Neck:     Thyroid: No thyromegaly.  Cardiovascular:     Rate and Rhythm: Normal rate and regular rhythm.     Heart sounds: Normal heart sounds. No murmur heard. Pulmonary:     Effort: Pulmonary effort is normal. No respiratory distress.     Breath sounds: Normal breath sounds. No wheezing or rales.  Abdominal:     General: Bowel sounds are normal. There is no distension.     Palpations: Abdomen is soft.     Tenderness: There is  no abdominal tenderness.  Musculoskeletal:        General: Normal range of motion.     Cervical back: Normal range of motion and neck supple.  Lymphadenopathy:     Cervical: No cervical adenopathy.  Skin:    General: Skin is warm and dry.  Neurological:     Mental Status: She is alert and oriented to person, place, and time.  Psychiatric:        Behavior: Behavior normal.        Thought Content: Thought content normal.        Judgment: Judgment normal.     EKG: sinus arrhythmia. No ischemic changes  Assessment & Plan:   Diagnoses and all orders for this visit:  ESSENTIAL HYPERTENSION, BENIGN -     EKG 12-Lead  Coronary artery disease due to lipid rich plaque -     EKG 12-Lead  Hyperlipidemia LDL goal <100 -     EKG 12-Lead  Angina  pectoris (HCC) -     EKG 12-Lead       I am having Steward Drone R. Dansby maintain her aspirin, cholecalciferol, multivitamin-iron-minerals-folic acid, Glucosamine-Chondroit-Vit C-Mn (GLUCOSAMINE 1500 COMPLEX PO), nystatin cream, trimethoprim-polymyxin b, lisinopril, omeprazole, atorvastatin, levothyroxine, verapamil, cloNIDine, and amLODipine.  Allergies as of 09/12/2023       Reactions   Erythromycin Swelling   Latex Other (See Comments)   Skin gets real red   Nickel    Other reaction(s): Other (See Comments) blisters   Penicillins Hives   Sulfonamide Derivatives Rash        Medication List        Accurate as of September 12, 2023 12:09 PM. If you have any questions, ask your nurse or doctor.          amLODipine 5 MG tablet Commonly known as: NORVASC Take 1 tablet (5 mg total) by mouth daily.   aspirin 325 MG tablet Take 325 mg by mouth daily.   atorvastatin 40 MG tablet Commonly known as: LIPITOR Take 1 tablet (40 mg total) by mouth daily.   cholecalciferol 1000 units tablet Commonly known as: VITAMIN D Take 1,000 Units by mouth daily.   cloNIDine 0.1 MG tablet Commonly known as: CATAPRES Take 1 tablet (0.1 mg total) by mouth 2 (two) times daily.   GLUCOSAMINE 1500 COMPLEX PO Take by mouth.   levothyroxine 88 MCG tablet Commonly known as: SYNTHROID Take 1 tablet (88 mcg total) by mouth daily before breakfast.   lisinopril 40 MG tablet Commonly known as: ZESTRIL Take 1 tablet (40 mg total) by mouth daily.   multivitamin-iron-minerals-folic acid chewable tablet Chew 1 tablet by mouth daily.   nystatin cream Commonly known as: MYCOSTATIN Apply 1 application topically 2 (two) times daily.   omeprazole 40 MG capsule Commonly known as: PRILOSEC Take 1 capsule (40 mg total) by mouth daily.   trimethoprim-polymyxin b ophthalmic solution Commonly known as: Polytrim Place 1 drop into the left eye every 6 (six) hours.   verapamil 120 MG tablet Commonly  known as: CALAN Take 1 tablet (120 mg total) by mouth daily.         Follow-up: No follow-ups on file.  Mechele Claude, M.D.

## 2023-09-18 ENCOUNTER — Ambulatory Visit: Payer: Self-pay | Admitting: Nurse Practitioner

## 2023-09-18 NOTE — Telephone Encounter (Signed)
Copied from CRM [id number]. Topic: Clinical - Red Word Triage >> Sep 18, 2023  3:40 PM Suzette B wrote: [facility] that prompted transfer to Nurse Triage: experiencing shakiness, wobbly, and anxiety from new blood pressure medicine very uncomfortable never experienced this   Chief Complaint: Dizziness and Chest Pain Symptoms: Dizziness, chest pain, high BP, walking unsteadily "feeling wobbly," anxiety Frequency: Constant chest pain and dizziness "all day long" Pertinent Negatives: Patient denies SOB, room spinning/tilting, cough, vomiting Disposition: [x] ED /[] Urgent Care (no appt availability in office) / [] Appointment(In office/virtual)/ []  [facility]/ [] Home Care/ [] Refused Recommended Disposition /[] [facility]/ []  Follow-up with PCP Additional Notes: Pt called reporting that she's been experiencing dizziness, "feeling wobbly," can walk but unsteady, "best way to describe it is 'lightheaded,'" and chest pain. Pt reporting that she has recently started new medications including clonidine 1 mg and amlodipine 5 mg, pt is reporting concern about amlodipine as the one that's giving her symptoms as it was more recently prescribed. Pt reporting that she took her morning dose of meds today and still has the evening dose to take. Pt reporting that at work before lunch she took her BP on their BP cuff and reading was "164/60-something." Pt reporting that with the dizziness and chest pain was "solid, nothing made it better or worse" and it was "pretty constant all day long," but "gotten better this afternoon "since resting, it's quit hurting, no pain right now." Pt reporting that she has experienced this chest pain "long time ago when had to go to [place] and got all these medications for high BP." Advised pt go to ED, confirmed pt has family member (her son) who can take her to ED. Pt stated that she'll "have to think on it," but verbalized understanding of nurse advice. Informed pt that  nurse would call back tomorrow to ensure pt doing okay, pt verbalized understanding. Routing to PCP office, arranging for call back from E2[facility] to ensure pt went to ED/feeling okay.   Reason for Disposition  [1] Chest pain lasts > 5 minutes AND [2] occurred in past 3 days (72 hours) (Exception: Feels exactly the same as previously diagnosed heartburn and has accompanying sour taste in mouth.)  Answer Assessment - Initial Assessment Questions 1. LOCATION: "Where does it hurt?"       Pt reporting she had pain in chest today, "felt like indigestion" 2. RADIATION: "Does the pain go anywhere else?" (e.g., into neck, jaw, arms, back)     Pt has no c/o pain elsewhere, but stated that it "felt like indigestion" 3. ONSET: "When did the chest pain begin?" (Minutes, hours or days)      Unclear, pt reporting that chest pain started at lunch time and also was "pretty constant all day long." 4. PATTERN: "Does the pain come and go, or has it been constant since it started?"  "Does it get worse with exertion?"      Pt reporting that the pain in her chest has been "constant since lunch," but "since resting, it's quit hurting, no pain right now." 5. DURATION: "How long does it last" (e.g., seconds, minutes, hours)     Pt had chest pain for hours between lunch and now, finally relieved by resting 6. SEVERITY: "How bad is the pain?"  (e.g., Scale 1-10; mild, moderate, or severe)    - MILD (1-3): doesn't interfere with normal activities     - MODERATE (4-7): interferes with normal activities or awakens from sleep    -  SEVERE (8-10): excruciating pain, unable to do any normal activities       Pt reporting that her chest pain "felt like indigestion," was able to work but accompanying dizziness had her feeling unsteady and that the pain has been "solid, nothing made it better or worse" all day until resting this afternoon. 7. CARDIAC RISK FACTORS: "Do you have any history of heart problems or risk factors for heart  disease?" (e.g., angina, prior heart attack; diabetes, high blood pressure, high cholesterol, smoker, or strong family history of heart disease)     Pt reporting hx of hypertension, pt's chart also notes coronary artery disease. 8. PULMONARY RISK FACTORS: "Do you have any history of lung disease?"  (e.g., blood clots in lung, asthma, emphysema, birth control pills)     N/A 9. CAUSE: "What do you think is causing the chest pain?"     Pt is thinking that she is having bad effects from her new medication. 10. OTHER SYMPTOMS: "Do you have any other symptoms?" (e.g., dizziness, nausea, vomiting, sweating, fever, difficulty breathing, cough)       Pt reporting dizziness and high BP. Pt denies SOB, room spinning/tilting, cough, vomiting  Answer Assessment - Initial Assessment Questions 1. DESCRIPTION: "Describe your dizziness."     Pt reporting that she feels "wobbly," can walk but unsteady, confirms "lightheaded." 2. LIGHTHEADED: "Do you feel lightheaded?" (e.g., somewhat faint, woozy, weak upon standing)     Yes 3. VERTIGO: "Do you feel like either you or the room is spinning or tilting?" (i.e. vertigo)     No 4. SEVERITY: "How bad is it?"  "Do you feel like you are going to faint?" "Can you stand and walk?"   - MILD: Feels slightly dizzy, but walking normally.   - MODERATE: Feels unsteady when walking, but not falling; interferes with normal activities (e.g., school, work).   - SEVERE: Unable to walk without falling, or requires assistance to walk without falling; feels like passing out now.      Pt reporting she feels lightheaded while walking and active, not falling over. 5. ONSET:  "When did the dizziness begin?"     Unclear, pt reporting that the symptoms have been "pretty constant all day long." 6. AGGRAVATING FACTORS: "Does anything make it worse?" (e.g., standing, change in head position)     Pt denies aggravating factors 7. HEART RATE: "Can you tell me your heart rate?" "How many beats  in 15 seconds?"  (Note: not all patients can do this)       Pt unsure of HR at this time 8. CAUSE: "What do you think is causing the dizziness?"     Pt is thinking that her new medication amlodipine is causing her symptoms. 9. RECURRENT SYMPTOM: "Have you had dizziness before?" If Yes, ask: "When was the last time?" "What happened that time?"     Unclear answer, confirms experienced chest pain previously though 10. OTHER SYMPTOMS: "Do you have any other symptoms?" (e.g., fever, chest pain, vomiting, diarrhea, bleeding)       Pt reporting chest pain, confirms anxiety. Pt denies vomiting, SOB, or other symptoms at this time.  Protocols used: Dizziness - Lightheadedness-A-AH, Chest Pain-A-AH

## 2023-09-18 NOTE — Telephone Encounter (Signed)
Called the patient's son, and the patient's son informed me that he was unaware the patient was supposed to go to the ER. Advised that the patient be taken to the ER due to the triage encounter and the patient's symptoms, states the patient is adamantly refusing the disposition.

## 2023-09-19 NOTE — Telephone Encounter (Addendum)
Called pt back to assess how feeling. Pt reporting that she guesses that she has "to get used to it, the medication," because today she confirms no dizziness, no chest pain, no wobbly feelings, no other symptoms. Pt reporting she will go to the ED "if needed," "will call" [place] if she has return of symptoms. Advised pt follow up with PCP to ensure doing okay. Scheduled for 2 days from now per pt preference and work schedule. Advised pt to go to ED straight away if return of chest pain, dizziness, wobbly feelings, or otherwise, call if any questions or concerns. Pt verbalized understanding.

## 2023-09-21 ENCOUNTER — Ambulatory Visit (INDEPENDENT_AMBULATORY_CARE_PROVIDER_SITE_OTHER): Payer: Medicare Other | Admitting: Nurse Practitioner

## 2023-09-21 ENCOUNTER — Encounter: Payer: Self-pay | Admitting: Nurse Practitioner

## 2023-09-21 VITALS — BP 129/68 | HR 73 | Temp 96.8°F | Resp 20 | Ht 59.0 in | Wt 141.6 lb

## 2023-09-21 DIAGNOSIS — Z87898 Personal history of other specified conditions: Secondary | ICD-10-CM

## 2023-09-21 NOTE — Progress Notes (Signed)
   Subjective:    Patient ID: Bonnie Tanner, female    DOB: Jan 25, 1941, 82 y.o.   MRN: [id number]   Chief Complaint: Follow-up and Chest Pain   HPI  Patient was seen on 09/12/23 with chest pain. Heart checked out fine. Was sent home with no change in plan of care. She has been fine since she was seen. No recurrent episodes. Patient Active Problem List   Diagnosis Date Noted   BMI 32.0-32.9,adult 05/31/2015   GERD (gastroesophageal reflux disease) 07/13/2014   Breast cancer (HCC) 12/24/2010   Hyperlipidemia LDL goal <100 12/08/2009   ESSENTIAL HYPERTENSION, BENIGN 12/08/2009   CAD (coronary artery disease) 12/08/2009   Hypothyroidism 12/07/2009       Review of Systems  All other systems reviewed and are negative.      Objective:   Physical Exam Vitals and nursing note reviewed.  Constitutional:      General: She is not in acute distress.    Appearance: Normal appearance. She is well-developed.  Neck:     Vascular: No carotid bruit or JVD.  Cardiovascular:     Rate and Rhythm: Regular rhythm.     Heart sounds: Normal heart sounds.  Pulmonary:     Effort: Pulmonary effort is normal. No respiratory distress.     Breath sounds: Normal breath sounds. No wheezing or rales.  Chest:     Chest wall: No tenderness.  Abdominal:     General: Bowel sounds are normal. There is no distension or abdominal bruit.     Palpations: Abdomen is soft. There is no hepatomegaly, splenomegaly, mass or pulsatile mass.     Tenderness: There is no abdominal tenderness.  Musculoskeletal:        General: Normal range of motion.     Cervical back: Normal range of motion and neck supple.  Lymphadenopathy:     Cervical: No cervical adenopathy.  Skin:    General: Skin is warm and dry.  Neurological:     Mental Status: She is alert and oriented to person, place, and time.     Deep Tendon Reflexes: Reflexes are normal and symmetric.  Psychiatric:        Behavior: Behavior normal.        Thought  Content: Thought content normal.        Judgment: Judgment normal.     BP 129/68 (BP Location: Left Arm)   Pulse 73   Temp (!) 96.8 F (36 C) (Temporal)   Resp 20   Ht 4\' 11"  (1.499 m)   Wt 141 lb 9.6 oz (64.2 kg)   SpO2 96%   BMI 28.60 kg/m        Assessment & Plan:   Bonnie Tanner in today with chief complaint of Follow-up and Chest Pain   1. Hx of chest pain (Primary) Keep diary  of any new episodes Last office note removed RTO prn    The above assessment and management plan was discussed with the patient. The patient verbalized understanding of and has agreed to the management plan. Patient is aware to call the clinic if symptoms persist or worsen. Patient is aware when to return to the clinic for a follow-up visit. Patient educated on when it is appropriate to go to the emergency department.   Mary-Margaret Daphine Deutscher, FNP

## 2023-10-23 ENCOUNTER — Encounter: Payer: Self-pay | Admitting: Family

## 2023-10-23 ENCOUNTER — Ambulatory Visit (INDEPENDENT_AMBULATORY_CARE_PROVIDER_SITE_OTHER): Payer: Medicare Other | Admitting: Family

## 2023-10-23 VITALS — BP 140/60 | HR 56 | Temp 97.5°F | Ht 59.0 in | Wt 142.4 lb

## 2023-10-23 DIAGNOSIS — H00011 Hordeolum externum right upper eyelid: Secondary | ICD-10-CM

## 2023-10-23 MED ORDER — BACITRACIN-POLYMYXIN B 500-10000 UNIT/GM OP OINT: 1.0000 | TOPICAL_OINTMENT | Freq: Three times a day (TID) | OPHTHALMIC | 0 refills | Status: DC

## 2023-10-23 NOTE — Progress Notes (Signed)
 Subjective:    Patient ID: Bonnie Tanner, female    DOB: Feb 23, 1941, 83 y.o.   MRN: [id number]  Chief Complaint  Patient presents with   Eye Problem    Right eye lid swelling   PT presents to the office today with right eyelid swelling that started 3-4 days ago. Reports she has been using warm compresses with mild relief.  Eye Problem  The right eye is affected. This is a new problem. The current episode started in the past 7 days. The problem occurs constantly. The problem has been gradually improving. The injury mechanism was a foreign body. The pain is at a severity of 2/10. The pain is mild. Associated symptoms include photophobia and a recent URI. Pertinent negatives include no eye discharge, double vision, eye redness, fever or foreign body sensation. She has tried commercial eye wash for the symptoms. The treatment provided mild relief.      Review of Systems  Constitutional:  Negative for fever.  Eyes:  Positive for photophobia. Negative for double vision, discharge and redness.  All other systems reviewed and are negative.      Objective:   Physical Exam Vitals reviewed.  Constitutional:      General: She is not in acute distress.    Appearance: She is well-developed.  HENT:     Head: Normocephalic and atraumatic.  Eyes:     General:        Right eye: Hordeolum present.     Pupils: Pupils are equal, round, and reactive to light.     Comments: Right eyelid erythemas, swollen   Neck:     Thyroid : No thyromegaly.  Cardiovascular:     Rate and Rhythm: Normal rate and regular rhythm.     Heart sounds: Normal heart sounds. No murmur heard. Pulmonary:     Effort: Pulmonary effort is normal. No respiratory distress.     Breath sounds: Normal breath sounds. No wheezing.  Abdominal:     General: Bowel sounds are normal. There is no distension.     Palpations: Abdomen is soft.     Tenderness: There is no abdominal tenderness.  Musculoskeletal:        General: No  tenderness. Normal range of motion.     Cervical back: Normal range of motion and neck supple.  Skin:    General: Skin is warm and dry.  Neurological:     Mental Status: She is alert and oriented to person, place, and time.     Cranial Nerves: No cranial nerve deficit.     Deep Tendon Reflexes: Reflexes are normal and symmetric.  Psychiatric:        Behavior: Behavior normal.        Thought Content: Thought content normal.        Judgment: Judgment normal.       BP (!) 140/60 Comment: at home  Pulse (!) 56   Temp (!) 97.5 F (36.4 C) (Temporal)   Ht 4' 11 (1.499 m)   Wt 142 lb 6.4 oz (64.6 kg)   SpO2 99%   BMI 28.76 kg/m      Assessment & Plan:  Bonnie Tanner comes in today with chief complaint of Eye Problem (Right eye lid swelling)   Diagnosis and orders addressed:  1. Hordeolum externum of right upper eyelid (Primary) Warm compresses Avoid rubbing eye Good hand hygiene  Start polysporin  TID  - bacitracin -polymyxin b  (POLYSPORIN ) ophthalmic ointment; Place 1 Application into the right eye 3 (three)  times daily. Dispense: 7 g; Refill: 0  Bari Learn, FNP

## 2023-10-23 NOTE — Patient Instructions (Signed)
 Stye A stye, also known as a hordeolum, is a bump that forms on an eyelid. It may look like a pimple next to the eyelash. A stye can form inside the eyelid (internal stye) or outside the eyelid (external stye). A stye can cause redness, swelling, and pain on the eyelid. Styes are very common. Anyone can get them at any age. They usually occur in just one eye at a time, but you may have more than one in either eye. What are the causes? A stye is caused by an infection. The infection is almost always caused by bacteria called Staphylococcus aureus. This is a common type of bacteria that lives on the skin. An internal stye may result from an infected oil-producing gland inside the eyelid. An external stye may be caused by an infection at the base of the eyelash (hair follicle). What increases the risk? You are more likely to develop a stye if: You have had a stye before. You have any of these conditions: Red, itchy, inflamed eyelids (blepharitis). A skin condition such as seborrheic dermatitis or rosacea. High fat levels in your blood (lipids). Dry eyes. What are the signs or symptoms? The most common symptom of a stye is eyelid pain. Internal styes are more painful than external styes. Other symptoms may include: Painful swelling of your eyelid. A scratchy feeling in your eye. Tearing and redness of your eye. A pimple-like bump on the edge of the eyelid. Pus draining from the stye. How is this diagnosed? Your health care provider may be able to diagnose a stye just by examining your eye. The health care provider may also check to make sure: You do not have a fever or other signs of a more serious infection. The infection has not spread to other parts of your eye or areas around your eye. How is this treated? Most styes will clear up in a few days without treatment or with warm compresses applied to the area. You may need to use antibiotic drops or ointment to treat an infection. Sometimes,  steroid drops or ointment are used in addition to antibiotics. In some cases, your health care provider may give you a small steroid injection in the eyelid. If your stye does not heal with routine treatment, your health care provider may drain pus from the stye using a thin blade or needle. This may be done if the stye is large, causing a lot of pain, or affecting your vision. Follow these instructions at home: Take over-the-counter and prescription medicines only as told by your health care provider. This includes eye drops or ointments. If you were prescribed an antibiotic medicine, steroid medicine, or both, apply or use them as told by your health care provider. Do not stop using the medicine even if your condition improves. Apply a warm, wet cloth (warm compress) to your eye for 5-10 minutes, 4 to 6 times a day. Clean the affected eyelid as directed by your health care provider. Do not wear contact lenses or eye makeup until your stye has healed and your health care provider says that it is safe. Do not try to pop or drain the stye. Do not rub your eye. Contact a health care provider if: You have chills or a fever. Your stye does not go away after several days. Your stye affects your vision. Your eyeball becomes swollen, red, or painful. Get help right away if: You have pain when moving your eye around. Summary A stye is a bump that forms  on an eyelid. It may look like a pimple next to the eyelash. A stye can form inside the eyelid (internal stye) or outside the eyelid (external stye). A stye can cause redness, swelling, and pain on the eyelid. Your health care provider may be able to diagnose a stye just by examining your eye. Apply a warm, wet cloth (warm compress) to your eye for 5-10 minutes, 4 to 6 times a day. This information is not intended to replace advice given to you by your health care provider. Make sure you discuss any questions you have with your health care  provider. Document Revised: 12/01/2020 Document Reviewed: 12/01/2020 Elsevier Patient Education  2024 [employer].

## 2023-12-21 ENCOUNTER — Encounter: Payer: Self-pay | Admitting: Nurse Practitioner

## 2023-12-21 ENCOUNTER — Ambulatory Visit (INDEPENDENT_AMBULATORY_CARE_PROVIDER_SITE_OTHER): Admitting: Nurse Practitioner

## 2023-12-21 VITALS — BP 127/60 | HR 66 | Temp 97.5°F | Ht 59.0 in | Wt 145.0 lb

## 2023-12-21 DIAGNOSIS — R600 Localized edema: Secondary | ICD-10-CM | POA: Diagnosis not present

## 2023-12-21 MED ORDER — FUROSEMIDE 20 MG PO TABS: 20.0000 mg | ORAL_TABLET | Freq: Every day | ORAL | 3 refills | Status: DC

## 2023-12-21 NOTE — Progress Notes (Signed)
   Subjective:    Patient ID: Bonnie Tanner, female    DOB: [birth year], 83 y.o.   MRN: [id number]  Chief Complaint: Bilateral feet swelling (For 3 weeks/)   HPI  Patient in c/o peripheral edema daily for the last 3 weeks. 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 Have not been going down much at night.  Patient Active Problem List   Diagnosis Date Noted   BMI 32.0-32.9,adult 05/31/2015   GERD (gastroesophageal reflux disease) 07/13/2014   Breast cancer (HCC) 12/24/2010   Hyperlipidemia LDL goal <100 12/08/2009   ESSENTIAL HYPERTENSION, BENIGN 12/08/2009   CAD (coronary artery disease) 12/08/2009   Hypothyroidism 12/07/2009       Review of Systems  Respiratory:  Negative for shortness of breath.   Cardiovascular:  Positive for leg swelling. Negative for chest pain and palpitations.       Objective:   Physical Exam Constitutional:      Appearance: Normal appearance. She is obese.  Cardiovascular:     Rate and Rhythm: Normal rate and regular rhythm.     Heart sounds: Normal heart sounds.  Pulmonary:     Effort: Pulmonary effort is normal.     Breath sounds: Normal breath sounds.  Skin:    General: Skin is warm.  Neurological:     General: No focal deficit present.     Mental Status: She is alert and oriented to person, place, and time.  Psychiatric:        Mood and Affect: Mood normal.        Behavior: Behavior normal.    BP 127/60   Pulse 66   Temp (!) 97.5 F (36.4 C) (Temporal)   Ht 4\' 11"  (1.499 m)   Wt 145 lb (65.8 kg)   SpO2 100%   BMI 29.29 kg/m         Assessment & Plan:   Bonnie Tanner in today with chief complaint of Bilateral feet swelling (For 3 weeks/)   1. Peripheral edema (Primary) Elevate legs when sitting Compression socks especially when on feet all day - furosemide (LASIX) 20 MG tablet; Take 1 tablet (20 mg total) by mouth daily.  Dispense: 30 tablet; Refill: 3 - BMP8+EGFR - Brain  natriuretic peptide    The above assessment and management plan was discussed with the patient. The patient verbalized understanding of and has agreed to the management plan. Patient is aware to call the clinic if symptoms persist or worsen. Patient is aware when to return to the clinic for a follow-up visit. Patient educated on when it is appropriate to go to the emergency department.   Bonnie Daphine Deutscher, FNP

## 2023-12-21 NOTE — Patient Instructions (Signed)
 Peripheral Edema  Peripheral edema is swelling that is caused by a buildup of fluid. Peripheral edema most often affects the lower legs, ankles, and feet. It can also develop in the arms, hands, and face. The area of the body that has peripheral edema will look swollen. It may also feel heavy or warm. Your clothes may start to feel tight. Pressing on the area may make a temporary dent in your skin (pitting edema). You may not be able to move your swollen arm or leg as much as usual. There are many causes of peripheral edema. It can happen because of a complication of other conditions such as heart failure, kidney disease, or a problem with your circulation. It also can be a side effect of certain medicines or happen because of an infection. It often happens to women during pregnancy. Sometimes, the cause is not known. Follow these instructions at home: Managing pain, stiffness, and swelling  Raise (elevate) your legs while you are sitting or lying down. Move around often to prevent stiffness and to reduce swelling. Do not sit or stand for long periods of time. Do not wear tight clothing. Do not wear garters on your upper legs. Exercise your legs to get your circulation going. This helps to move the fluid back into your blood vessels, and it may help the swelling go down. Wear compression stockings as told by your health care provider. These stockings help to prevent blood clots and reduce swelling in your legs. It is important that these are the correct size. These stockings should be prescribed by your doctor to prevent possible injuries. If elastic bandages or wraps are recommended, use them as told by your health care provider. Medicines Take over-the-counter and prescription medicines only as told by your health care provider. Your health care provider may prescribe medicine to help your body get rid of excess water (diuretic). Take this medicine if you are told to take it. General  instructions Eat a low-salt (low-sodium) diet as told by your health care provider. Sometimes, eating less salt may reduce swelling. Pay attention to any changes in your symptoms. Moisturize your skin daily to help prevent skin from cracking and draining. Keep all follow-up visits. This is important. Contact a health care provider if: You have a fever. You have swelling in only one leg. You have increased swelling, redness, or pain in one or both of your legs. You have drainage or sores at the area where you have edema. Get help right away if: You have edema that starts suddenly or is getting worse, especially if you are pregnant or have a medical condition. You develop shortness of breath, especially when you are lying down. You have pain in your chest or abdomen. You feel weak. You feel like you will faint. These symptoms may be an emergency. Get help right away. Call 911. Do not wait to see if the symptoms will go away. Do not drive yourself to the hospital. Summary Peripheral edema is swelling that is caused by a buildup of fluid. Peripheral edema most often affects the lower legs, ankles, and feet. Move around often to prevent stiffness and to reduce swelling. Do not sit or stand for long periods of time. Pay attention to any changes in your symptoms. Contact a health care provider if you have edema that starts suddenly or is getting worse, especially if you are pregnant or have a medical condition. Get help right away if you develop shortness of breath, especially when lying down.  This information is not intended to replace advice given to you by your health care provider. Make sure you discuss any questions you have with your health care provider. Document Revised: 05/30/2021 Document Reviewed: 05/30/2021 Elsevier Patient Education  2024 [employer].

## 2023-12-22 LAB — BMP8+EGFR
BUN/Creatinine Ratio: 23 (ref 12–28)
BUN: 19 mg/dL (ref 8–27)
CO2: 24 mmol/L (ref 20–29)
Calcium: 10.1 mg/dL (ref 8.7–10.3)
Chloride: 104 mmol/L (ref 96–106)
Creatinine, Ser: 0.82 mg/dL (ref 0.57–1.00)
Glucose: 96 mg/dL (ref 70–99)
Potassium: 4.3 mmol/L (ref 3.5–5.2)
Sodium: 141 mmol/L (ref 134–144)
eGFR: 72 mL/min/{1.73_m2} (ref >59)

## 2023-12-22 LAB — BRAIN NATRIURETIC PEPTIDE: BNP: 28.7 pg/mL (ref 0.0–100.0)

## 2024-01-06 ENCOUNTER — Other Ambulatory Visit: Payer: Self-pay | Admitting: Nurse Practitioner

## 2024-01-08 ENCOUNTER — Telehealth: Payer: Self-pay | Admitting: Family Medicine

## 2024-01-08 NOTE — Telephone Encounter (Signed)
 Copied from CRM [id number]. Topic: Clinical - Medical Advice >> Jan 08, 2024  8:28 AM Patsy Lager T wrote: Reason for CRM: patient called stated she is having lower back pain and is requesting a pain medication. Please f/u with patient

## 2024-01-08 NOTE — Telephone Encounter (Signed)
 Lmtcb to schedule an apt

## 2024-01-14 ENCOUNTER — Ambulatory Visit: Admitting: Nurse Practitioner

## 2024-01-14 ENCOUNTER — Encounter: Payer: Self-pay | Admitting: Nurse Practitioner

## 2024-01-14 VITALS — BP 119/58 | HR 61 | Temp 97.2°F | Ht 59.0 in | Wt 147.0 lb

## 2024-01-14 DIAGNOSIS — M549 Dorsalgia, unspecified: Secondary | ICD-10-CM

## 2024-01-14 DIAGNOSIS — N3000 Acute cystitis without hematuria: Secondary | ICD-10-CM

## 2024-01-14 LAB — MICROSCOPIC EXAMINATION
Epithelial Cells (non renal): NONE SEEN /HPF (ref 0–10)
RBC, Urine: NONE SEEN /HPF (ref 0–2)
Renal Epithel, UA: NONE SEEN /HPF
Yeast, UA: NONE SEEN

## 2024-01-14 LAB — URINALYSIS, ROUTINE W REFLEX MICROSCOPIC
Bilirubin, UA: NEGATIVE
Glucose, UA: NEGATIVE
Ketones, UA: NEGATIVE
Nitrite, UA: NEGATIVE
Protein,UA: NEGATIVE
RBC, UA: NEGATIVE
Specific Gravity, UA: <1.005 — ABNORMAL LOW (ref 1.005–1.030)
Urobilinogen, Ur: 0.2 mg/dL (ref 0.2–1.0)
pH, UA: 5.5 (ref 5.0–7.5)

## 2024-01-14 MED ORDER — DOXYCYCLINE HYCLATE 100 MG PO TABS: 100.0000 mg | ORAL_TABLET | Freq: Two times a day (BID) | ORAL | 0 refills | Status: DC

## 2024-01-14 MED ORDER — METHYLPREDNISOLONE ACETATE 80 MG/ML IJ SUSP
80.0000 mg | Freq: Once | INTRAMUSCULAR | Status: AC
  Administered 2024-01-14: 80 mg via INTRAMUSCULAR

## 2024-01-14 MED ORDER — KETOROLAC TROMETHAMINE 60 MG/2ML IM SOLN
60.0000 mg | Freq: Once | INTRAMUSCULAR | Status: AC
  Administered 2024-01-14: 60 mg via INTRAMUSCULAR

## 2024-01-14 NOTE — Patient Instructions (Signed)
 Acute Back Pain, Adult Acute back pain is sudden and usually short-lived. It is often caused by an injury to the muscles and tissues in the back. The injury may result from: A muscle, tendon, or ligament getting overstretched or torn. Ligaments are tissues that connect bones to each other. Lifting something improperly can cause a back strain. Wear and tear (degeneration) of the spinal disks. Spinal disks are circular tissue that provide cushioning between the bones of the spine (vertebrae). Twisting motions, such as while playing sports or doing yard work. A hit to the back. Arthritis. You may have a physical exam, lab tests, and imaging tests to find the cause of your pain. Acute back pain usually goes away with rest and home care. Follow these instructions at home: Managing pain, stiffness, and swelling Take over-the-counter and prescription medicines only as told by your health care provider. Treatment may include medicines for pain and inflammation that are taken by mouth or applied to the skin, or muscle relaxants. Your health care provider may recommend applying ice during the first 24-48 hours after your pain starts. To do this: Put ice in a plastic bag. Place a towel between your skin and the bag. Leave the ice on for 20 minutes, 2-3 times a day. Remove the ice if your skin turns bright red. This is very important. If you cannot feel pain, heat, or cold, you have a greater risk of damage to the area. If directed, apply heat to the affected area as often as told by your health care provider. Use the heat source that your health care provider recommends, such as a moist heat pack or a heating pad. Place a towel between your skin and the heat source. Leave the heat on for 20-30 minutes. Remove the heat if your skin turns bright red. This is especially important if you are unable to feel pain, heat, or cold. You have a greater risk of getting burned. Activity  Do not stay in bed. Staying in  bed for more than 1-2 days can delay your recovery. Sit up and stand up straight. Avoid leaning forward when you sit or hunching over when you stand. If you work at a desk, sit close to it so you do not need to lean over. Keep your chin tucked in. Keep your neck drawn back, and keep your elbows bent at a 90-degree angle (right angle). Sit high and close to the steering wheel when you drive. Add lower back (lumbar) support to your car seat, if needed. Take short walks on even surfaces as soon as you are able. Try to increase the length of time you walk each day. Do not sit, drive, or stand in one place for more than 30 minutes at a time. Sitting or standing for long periods of time can put stress on your back. Do not drive or use heavy machinery while taking prescription pain medicine. Use proper lifting techniques. When you bend and lift, use positions that put less stress on your back: [place] your knees. Keep the load close to your body. Avoid twisting. Exercise regularly as told by your health care provider. Exercising helps your back heal faster and helps prevent back injuries by keeping muscles strong and flexible. Work with a physical therapist to make a safe exercise program, as recommended by your health care provider. Do any exercises as told by your physical therapist. Lifestyle Maintain a healthy weight. Extra weight puts stress on your back and makes it difficult to have good  posture. Avoid activities or situations that make you feel anxious or stressed. Stress and anxiety increase muscle tension and can make back pain worse. Learn ways to manage anxiety and stress, such as through exercise. General instructions Sleep on a firm mattress in a comfortable position. Try lying on your side with your knees slightly bent. If you lie on your back, put a pillow under your knees. Keep your head and neck in a straight line with your spine (neutral position) when using electronic equipment like  smartphones or pads. To do this: Raise your smartphone or pad to look at it instead of bending your head or neck to look down. Put the smartphone or pad at the level of your face while looking at the screen. Follow your treatment plan as told by your health care provider. This may include: Cognitive or behavioral therapy. Acupuncture or massage therapy. Meditation or yoga. Contact a health care provider if: You have pain that is not relieved with rest or medicine. You have increasing pain going down into your legs or buttocks. Your pain does not improve after 2 weeks. You have pain at night. You lose weight without trying. You have a fever or chills. You develop nausea or vomiting. You develop abdominal pain. Get help right away if: You develop new bowel or bladder control problems. You have unusual weakness or numbness in your arms or legs. You feel faint. These symptoms may represent a serious problem that is an emergency. Do not wait to see if the symptoms will go away. Get medical help right away. Call your local emergency services (911 in the [place] Do not drive yourself to the hospital. Summary Acute back pain is sudden and usually short-lived. Use proper lifting techniques. When you bend and lift, use positions that put less stress on your back. Take over-the-counter and prescription medicines only as told by your health care provider, and apply heat or ice as told. This information is not intended to replace advice given to you by your health care provider. Make sure you discuss any questions you have with your health care provider. Document Revised: 12/17/2020 Document Reviewed: 12/17/2020 Elsevier Patient Education  2024 [employer].

## 2024-01-14 NOTE — Progress Notes (Signed)
   Subjective:    Patient ID: Bonnie Tanner, female    DOB: [birth year], 83 y.o.   MRN: [id number]   Chief Complaint: Back Pain   Back Pain This is a new problem. The current episode started in the past 7 days. The problem occurs intermittently. The problem has been waxing and waning since onset. The pain is present in the lumbar spine. The pain does not radiate. The pain is at a severity of 7/10. The symptoms are aggravated by bending and standing. Pertinent negatives include no dysuria. She has tried analgesics for the symptoms. The treatment provided mild relief.    Patient Active Problem List   Diagnosis Date Noted   BMI 32.0-32.9,adult 05/31/2015   GERD (gastroesophageal reflux disease) 07/13/2014   Breast cancer (HCC) 12/24/2010   Hyperlipidemia LDL goal <100 12/08/2009   ESSENTIAL HYPERTENSION, BENIGN 12/08/2009   CAD (coronary artery disease) 12/08/2009   Hypothyroidism 12/07/2009        Review of Systems  Genitourinary:  Negative for dysuria.  Musculoskeletal:  Positive for back pain.       Objective:   Physical Exam Constitutional:      Appearance: Normal appearance. She is obese.  Cardiovascular:     Rate and Rhythm: Normal rate and regular rhythm.     Heart sounds: Normal heart sounds.  Pulmonary:     Effort: Pulmonary effort is normal.     Breath sounds: Normal breath sounds.  Skin:    General: Skin is warm.  Neurological:     General: No focal deficit present.     Mental Status: She is alert and oriented to person, place, and time.  Psychiatric:        Mood and Affect: Mood normal.        Behavior: Behavior normal.    BP (!) 119/58   Pulse 61   Temp (!) 97.2 F (36.2 C) (Temporal)   Ht 4\' 11"  (1.499 m)   Wt 147 lb (66.7 kg)   SpO2 99%   BMI 29.69 kg/m         Assessment & Plan:   [name] in today with chief complaint of Back Pain   1. Acute back pain, unspecified back location, unspecified back pain laterality (Primary) Moist  heat Rest  - Urinalysis, Routine w reflex microscopic - Urine Culture - methylPREDNISolone acetate (DEPO-MEDROL) injection 80 mg - ketorolac (TORADOL) injection 60 mg  2. Acute cystitis without hematuria Take medication as prescribe Cotton underwear Take shower not bath Cranberry juice, yogurt Force fluids AZO over the counter X2 days Culture pending RTO prn  - doxycycline (VIBRA-TABS) 100 MG tablet; Take 1 tablet (100 mg total) by mouth 2 (two) times daily. 1 po bid  Dispense: 20 tablet; Refill: 0    The above assessment and management plan was discussed with the patient. The patient verbalized understanding of and has agreed to the management plan. Patient is aware to call the clinic if symptoms persist or worsen. Patient is aware when to return to the clinic for a follow-up visit. Patient educated on when it is appropriate to go to the emergency department.   Mary-Margaret Daphine Deutscher, FNP

## 2024-01-16 LAB — URINE CULTURE

## 2024-01-17 ENCOUNTER — Telehealth: Payer: Self-pay

## 2024-01-17 MED ORDER — CIPROFLOXACIN HCL 500 MG PO TABS: 500.0000 mg | ORAL_TABLET | Freq: Two times a day (BID) | ORAL | 0 refills | Status: DC

## 2024-01-17 NOTE — Addendum Note (Signed)
 Addended by: Bennie Pierini on: 01/17/2024 08:59 AM   Modules accepted: Orders

## 2024-01-17 NOTE — Telephone Encounter (Signed)
 Copied from CRM [id number]. Topic: Clinical - Lab/Test Results >> Jan 17, 2024  9:01 AM Fuller Mandril wrote: Reason for CRM: Patient returned missed call. Call was for labs. Read note as written by provider.  Let her know Cipro was sent to [facility]. Patient understood. No Further questions at this time Thank You.

## 2024-01-17 NOTE — Telephone Encounter (Signed)
 Noted.

## 2024-01-22 ENCOUNTER — Other Ambulatory Visit: Payer: Self-pay | Admitting: Nurse Practitioner

## 2024-01-22 NOTE — Telephone Encounter (Unsigned)
 Copied from CRM [id number]. Topic: Clinical - Prescription Issue >> Jan 22, 2024  4:31 PM Felizardo Hotter wrote: Reason for CRM: Pt called stated pharmacy, [facility] [address] [place] -  [address], [place] [address] Phone: [phone]  Fax: [phone],  have not received prescription of ciprofloxacin (CIPRO) 500 MG tablet. Please resend and call pt at [phone].

## 2024-01-23 NOTE — Telephone Encounter (Signed)
 [facility] pharmacy did receive script and it was picked up, if pt is requesting refill, we do not do RFs on abx, she will ntbs if she is still having issues it maybe that this abx will not cover her sxs.

## 2024-02-03 ENCOUNTER — Other Ambulatory Visit: Payer: Self-pay | Admitting: Nurse Practitioner

## 2024-02-03 DIAGNOSIS — E039 Hypothyroidism, unspecified: Secondary | ICD-10-CM

## 2024-02-03 DIAGNOSIS — I10 Essential (primary) hypertension: Secondary | ICD-10-CM

## 2024-02-03 DIAGNOSIS — I2510 Atherosclerotic heart disease of native coronary artery without angina pectoris: Secondary | ICD-10-CM

## 2024-02-03 DIAGNOSIS — K219 Gastro-esophageal reflux disease without esophagitis: Secondary | ICD-10-CM

## 2024-02-11 ENCOUNTER — Ambulatory Visit: Payer: Medicare Other | Admitting: Nurse Practitioner

## 2024-02-25 ENCOUNTER — Ambulatory Visit: Payer: Medicare Other

## 2024-02-25 VITALS — BP 155/54 | HR 67 | Ht 59.0 in | Wt 145.0 lb

## 2024-02-25 DIAGNOSIS — Z0000 Encounter for general adult medical examination without abnormal findings: Secondary | ICD-10-CM | POA: Diagnosis not present

## 2024-02-25 DIAGNOSIS — Z1231 Encounter for screening mammogram for malignant neoplasm of breast: Secondary | ICD-10-CM

## 2024-02-25 NOTE — Patient Instructions (Signed)
 Bonnie Tanner , Thank you for taking time out of your busy schedule to complete your Annual Wellness Visit with me. I enjoyed our conversation and look forward to speaking with you again next year. I, as well as your care team,  appreciate your ongoing commitment to your health goals. Please review the following plan we discussed and let me know if I can assist you in the future. Your Game plan/ To Do List   Follow up Visits: Next Medicare AWV with our clinical staff: Wed., 02/25/25 at 1:10p.m.   Next Office Visit with your provider: 03/06/24 at 9:45a.m.  Clinician Recommendations:  Aim for 30 minutes of exercise or brisk walking, 6-8 glasses of water, and 5 servings of fruits and vegetables each day. Please remember to schedule your mammogram appointment. If you have any questions please contact our office [phone].      This is a list of the screening recommended for you and due dates:  Health Maintenance  Topic Date Due   Mammogram  05/09/2022   DEXA scan (bone density measurement)  08/12/2024*   COVID-19 Vaccine (3 - Moderna risk series) 03/12/2025*   Flu Shot  05/09/2024   Medicare Annual Wellness Visit  02/24/2025   DTaP/Tdap/Td vaccine (3 - Td or Tdap) 07/27/2032   Pneumonia Vaccine  Completed   Zoster (Shingles) Vaccine  Completed   HPV Vaccine  Aged Out   Meningitis B Vaccine  Aged Out   Cologuard (Stool DNA test)  Discontinued  *Topic was postponed. The date shown is not the original due date.    Advanced directives: (Declined) Advance directive discussed with you today. Even though you declined this today, please call our office should you change your mind, and we can give you the proper paperwork for you to fill out. Advance Care Planning is important because it:  [x]  Makes sure you receive the medical care that is consistent with your values, goals, and preferences  [x]  It provides guidance to your family and loved ones and reduces their decisional burden about whether or  not they are making the right decisions based on your wishes.  Follow the link provided in your after visit summary or read over the paperwork we have mailed to you to help you started getting your Advance Directives in place. If you need assistance in completing these, please reach out to us  so that we can help you!  See attachments for Preventive Care and Fall Prevention Tips.

## 2024-02-25 NOTE — Progress Notes (Signed)
 Subjective:   Bonnie Tanner is a 83 y.o. who presents for a Medicare Wellness preventive visit.  As a reminder, Annual Wellness Visits don't include a physical exam, and some assessments may be limited, especially if this visit is performed virtually. We may recommend an in-person follow-up visit with your provider if needed.  Visit Complete: Virtual I connected with  Bonnie Tanner on 02/25/24 by a audio enabled telemedicine application and verified that I am speaking with the correct person using two identifiers.  Patient Location: Home  Provider Location: Home Office  I discussed the limitations of evaluation and management by telemedicine. The patient expressed understanding and agreed to proceed.  Vital Signs: Because this visit was a virtual/telehealth visit, some criteria may be missing or patient reported. Any vitals not documented were not able to be obtained and vitals that have been documented are patient reported.  VideoDeclined- This patient declined Librarian, academic. Therefore the visit was completed with audio only.  Persons Participating in Visit: Patient.  AWV Questionnaire: No: Patient Medicare AWV questionnaire was not completed prior to this visit.  Cardiac Risk Factors include: advanced age (>52men, >10 women);dyslipidemia;hypertension     Objective:     Today's Vitals   02/25/24 1443  BP: (!) 155/54  Pulse: 67  Weight: 145 lb (65.8 kg)  Height: 4\' 11"  (1.499 m)   Body mass index is 29.29 kg/m.     02/25/2024    3:03 PM 02/22/2023    2:49 PM 07/27/2022   12:17 PM 03/29/2021    3:50 PM 02/02/2015    2:36 PM  Advanced Directives  Does Patient Have a Medical Advance Directive? No No No Yes No  Type of Aeronautical engineer of [place];Living will   Copy of Healthcare Power of Attorney in Chart?    No - copy requested   Would patient like information on creating a medical advance directive?  No - Patient  declined   Yes - Educational materials given    Current Medications (verified) Outpatient Encounter Medications as of 02/25/2024  Medication Sig   aspirin 325 MG tablet Take 325 mg by mouth daily.   atorvastatin  (LIPITOR) 40 MG tablet Take 1 tablet (40 mg total) by mouth daily.   cholecalciferol (VITAMIN D) 1000 UNITS tablet Take 1,000 Units by mouth daily.   cloNIDine  (CATAPRES ) 0.1 MG tablet TAKE 1 TABLET BY MOUTH TWICE  DAILY   furosemide  (LASIX ) 20 MG tablet Take 1 tablet (20 mg total) by mouth daily.   Glucosamine-Chondroit-Vit C-Mn (GLUCOSAMINE 1500 COMPLEX PO) Take by mouth.   levothyroxine  (SYNTHROID ) 88 MCG tablet TAKE 1 TABLET BY MOUTH DAILY  BEFORE BREAKFAST   lisinopril  (ZESTRIL ) 40 MG tablet TAKE 1 TABLET BY MOUTH DAILY   multivitamin-iron-minerals-folic acid (CENTRUM) chewable tablet Chew 1 tablet by mouth daily.   nystatin  cream (MYCOSTATIN ) Apply 1 application topically 2 (two) times daily.   omeprazole  (PRILOSEC) 40 MG capsule TAKE 1 CAPSULE BY MOUTH DAILY   trimethoprim -polymyxin b  (POLYTRIM ) ophthalmic solution Place 1 drop into the left eye every 6 (six) hours.   verapamil  (CALAN ) 120 MG tablet TAKE 1 TABLET BY MOUTH DAILY   amLODipine  (NORVASC ) 5 MG tablet Take 1 tablet (5 mg total) by mouth daily. (Patient not taking: Reported on 02/25/2024)   bacitracin -polymyxin b  (POLYSPORIN ) ophthalmic ointment Place 1 Application into the right eye 3 (three) times daily. (Patient not taking: Reported on 02/25/2024)   ciprofloxacin  (CIPRO ) 500 MG tablet Take  1 tablet (500 mg total) by mouth 2 (two) times daily. (Patient not taking: Reported on 02/25/2024)   doxycycline  (VIBRA -TABS) 100 MG tablet Take 1 tablet (100 mg total) by mouth 2 (two) times daily. 1 po bid (Patient not taking: Reported on 02/25/2024)   No facility-administered encounter medications on file as of 02/25/2024.    Allergies (verified) Erythromycin , Latex, Nickel, Penicillins, and Sulfonamide derivatives    History: Past Medical History:  Diagnosis Date   Arthritis    CAD (coronary artery disease)    Non obstructive   Cancer (HCC) 2011   breast/radiation Tx/Lumpectomy   GERD (gastroesophageal reflux disease)    Heart murmur    Hyperlipidemia    Hypertension    Hypothyroidism    Past Surgical History:  Procedure Laterality Date   BREAST LUMPECTOMY  04/19/2010   radiation therapy right breast   CARDIAC CATHETERIZATION  09/2009   CATARACT EXTRACTION  2006   bilateral   EYE SURGERY  2000   made her ducts   Family History  Problem Relation Age of Onset   Heart disease Mother 10   Emphysema Father    Heart disease Father    Throat cancer Brother    Stroke Brother    Healthy Son    Prostate cancer Paternal Uncle    Stomach cancer Paternal Uncle    Social History   Socioeconomic History   Marital status: Married    Spouse name: Not on file   Number of children: 1   Years of education: 14   Highest education level: Some college, no degree  Occupational History   Occupation: Beautician  Tobacco Use   Smoking status: Never   Smokeless tobacco: Never  Vaping Use   Vaping status: Never Used  Substance and Sexual Activity   Alcohol use: No   Drug use: No   Sexual activity: Yes  Other Topics Concern   Not on file  Social History Narrative   Lives in [place] with husband. 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   Social Drivers of Health   Financial Resource Strain: Low Risk  (02/25/2024)   Overall Financial Resource Strain (CARDIA)    Difficulty of Paying Living Expenses: Not hard at all  Food Insecurity: No Food Insecurity (02/25/2024)   Hunger Vital Sign    Worried About Running Out of Food in the Last Year: Never true    Ran Out of Food in the Last Year: Never true  Transportation Needs: No Transportation Needs (02/25/2024)   PRAPARE - Administrator, Civil Service (Medical): No    Lack of  Transportation (Non-Medical): No  Physical Activity: Insufficiently Active (02/22/2023)   Exercise Vital Sign    Days of Exercise per Week: 3 days    Minutes of Exercise per Session: 30 min  Stress: No Stress Concern Present (02/25/2024)   [employer] of Occupational Health - Occupational Stress Questionnaire    Feeling of Stress : Not at all  Social Connections: Moderately Integrated (02/25/2024)   Social Connection and Isolation Panel [NHANES]    Frequency of Communication with Friends and Family: More than three times a week    Frequency of Social Gatherings with Friends and Family: More than three times a week    Attends Religious Services: More than 4 times per year    Active Member of [employer] or Organizations: Yes    Attends Banker Meetings: More  than 4 times per year    Marital Status: Widowed    Tobacco Counseling Counseling given: Yes    Clinical Intake:  Pre-visit preparation completed: Yes  Pain : No/denies pain     BMI - recorded: 29.29 Nutritional Status: BMI 25 -29 Overweight Nutritional Risks: None Diabetes: No  No results found for: "HGBA1C"   How often do you need to have someone help you when you read instructions, pamphlets, or other written materials from your doctor or pharmacy?: 1 - Never  Interpreter Needed?: No  Information entered by :: Alia t/cma   Activities of Daily Living     02/25/2024    2:47 PM  In your present state of health, do you have any difficulty performing the following activities:  Hearing? 0  Vision? 0  Difficulty concentrating or making decisions? 0  Walking or climbing stairs? 0  Dressing or bathing? 0  Doing errands, shopping? 0  Preparing Food and eating ? N  Using the Toilet? N  In the past six months, have you accidently leaked urine? N  Do you have problems with loss of bowel control? N  Managing your Medications? N  Managing your Finances? N  Housekeeping or managing your Housekeeping? N     Patient Care Team: Delfina Feller, FNP as PCP - General (Nurse Practitioner) Bridgett Camps Amber Bail, MD as Consulting Physician (Gastroenterology)  Indicate any recent Medical Services you may have received from other than [facility] providers in the past year (date may be approximate).     Assessment:    This is a routine wellness examination for Elycia.  Hearing/Vision screen Hearing Screening - Comments:: Pt has a harder time hearing from the L-ear Vision Screening - Comments:: pt wear glasses/pt goes to [facility] in [facility]   Goals Addressed             This Visit's Progress    Exercise 3x per week (30 min per time)   On track    Walk for 30 minutes 3 times per week       Depression Screen     02/25/2024    3:06 PM 01/14/2024    9:29 AM 12/21/2023    8:10 AM 10/23/2023   11:05 AM 09/21/2023    2:49 PM 09/12/2023   11:21 AM 08/27/2023   10:03 AM  PHQ 2/9 Scores  PHQ - 2 Score 0 0 0 0 0 0 0  PHQ- 9 Score 0    0  0    Fall Risk     02/25/2024    3:00 PM 01/14/2024    9:29 AM 12/21/2023    8:10 AM 10/23/2023   11:05 AM 09/21/2023    2:49 PM  Fall Risk   Falls in the past year? 0 0 0 0 0  Number falls in past yr: 0   0 0  Injury with Fall? 0   0 0  Risk for fall due to : No Fall Risks    No Fall Risks  Follow up Falls prevention discussed;Falls evaluation completed    Falls evaluation completed    MEDICARE RISK AT HOME:  Medicare Risk at Home Any stairs in or around the home?: Yes If so, are there any without handrails?: Yes Home free of loose throw rugs in walkways, pet beds, electrical cords, etc?: Yes Adequate lighting in your home to reduce risk of falls?: Yes Life alert?: No Use of a cane, walker or w/c?: No Grab bars in the bathroom?: No  Shower chair or bench in shower?: No Elevated toilet seat or a handicapped toilet?: No  TIMED UP AND GO:  Was the test performed?  no  Cognitive Function: 6CIT completed    06/24/2018    8:55 AM 02/02/2015     2:45 PM  MMSE - Mini Mental State Exam  Not completed:  Refused  Orientation to time 5 5  Orientation to Place 5 5  Registration 3 3  Attention/ Calculation 5 5  Recall 3 3  Language- name 2 objects 2 2  Language- repeat 1 1  Language- follow 3 step command 3 3  Language- read & follow direction 1 1  Write a sentence 1 1  Copy design 1 1  Total score 30 30        02/25/2024    3:08 PM 02/22/2023    2:48 PM 03/29/2021    3:45 PM  6CIT Screen  What Year? 0 points 0 points 0 points  What month? 0 points 0 points 0 points  What time? 0 points 0 points 0 points  Count back from 20 0 points 0 points 0 points  Months in reverse 0 points 0 points 0 points  Repeat phrase 4 points 0 points 0 points  Total Score 4 points 0 points 0 points    Immunizations Immunization History  Administered Date(s) Administered   Fluad Quad(high Dose 65+) 07/21/2019, 11/15/2020, 06/27/2021, 07/10/2022   Fluad Trivalent(High Dose 65+) 08/13/2023   Influenza Whole 07/09/2010   Influenza, High Dose Seasonal PF 09/05/2016, 08/06/2017, 08/20/2018   Influenza,inj,Quad PF,6+ Mos 10/06/2013, 08/29/2014, 08/23/2015   Moderna SARS-COV2 Booster Vaccination 08/09/2020   Moderna Sars-Covid-2 Vaccination 10/14/2019, 11/11/2019   Novel Infuenza-h1n1-09 07/20/2010   Pneumococcal Conjugate-13 02/02/2015   Pneumococcal Polysaccharide-23 10/21/2012   Td 07/09/2009   Tdap 07/27/2022   Zoster Recombinant(Shingrix ) 01/02/2022, 02/12/2023    Screening Tests Health Maintenance  Topic Date Due   MAMMOGRAM  05/09/2022   DEXA SCAN  08/12/2024 (Originally 04/27/2023)   COVID-19 Vaccine (3 - Moderna risk series) 03/12/2025 (Originally 09/06/2020)   INFLUENZA VACCINE  05/09/2024   Medicare Annual Wellness (AWV)  02/24/2025   DTaP/Tdap/Td (3 - Td or Tdap) 07/27/2032   Pneumonia Vaccine 7+ Years old  Completed   Zoster Vaccines- Shingrix   Completed   HPV VACCINES  Aged Out   Meningococcal B Vaccine  Aged Out   Fecal  DNA (Cologuard)  Discontinued    Health Maintenance  Health Maintenance Due  Topic Date Due   MAMMOGRAM  05/09/2022   Health Maintenance Items Addressed: See Nurse Notes  Additional Screening:  Vision Screening: Recommended annual ophthalmology exams for early detection of glaucoma and other disorders of the eye.  Dental Screening: Recommended annual dental exams for proper oral hygiene  Community Resource Referral / Chronic Care Management: CRR required this visit?  No   CCM required this visit?  No   Plan:    I have personally reviewed and noted the following in the patient's chart:   Medical and social history Use of alcohol, tobacco or illicit drugs  Current medications and supplements including opioid prescriptions. Patient is not currently taking opioid prescriptions. Functional ability and status Nutritional status Physical activity Advanced directives List of other physicians Hospitalizations, surgeries, and ER visits in previous 12 months Vitals Screenings to include cognitive, depression, and falls Referrals and appointments  In addition, I have reviewed and discussed with patient certain preventive protocols, quality metrics, and best practice recommendations. A written personalized care plan for  preventive services as well as general preventive health recommendations were provided to patient.   Michaelle Adolphus, CMA   02/25/2024   After Visit Summary: (MyChart) Due to this being a telephonic visit, the after visit summary with patients personalized plan was offered to patient via MyChart   Notes: Please remember to schedule your mammogram appointment.

## 2024-03-06 ENCOUNTER — Encounter: Payer: Self-pay | Admitting: Nurse Practitioner

## 2024-03-06 ENCOUNTER — Ambulatory Visit (INDEPENDENT_AMBULATORY_CARE_PROVIDER_SITE_OTHER): Admitting: Nurse Practitioner

## 2024-03-06 VITALS — BP 145/66 | HR 56 | Temp 98.1°F | Ht 59.0 in | Wt 145.0 lb

## 2024-03-06 DIAGNOSIS — I2583 Coronary atherosclerosis due to lipid rich plaque: Secondary | ICD-10-CM | POA: Diagnosis not present

## 2024-03-06 DIAGNOSIS — I2510 Atherosclerotic heart disease of native coronary artery without angina pectoris: Secondary | ICD-10-CM

## 2024-03-06 DIAGNOSIS — R600 Localized edema: Secondary | ICD-10-CM | POA: Diagnosis not present

## 2024-03-06 DIAGNOSIS — C50911 Malignant neoplasm of unspecified site of right female breast: Secondary | ICD-10-CM | POA: Diagnosis not present

## 2024-03-06 DIAGNOSIS — K219 Gastro-esophageal reflux disease without esophagitis: Secondary | ICD-10-CM | POA: Diagnosis not present

## 2024-03-06 DIAGNOSIS — E785 Hyperlipidemia, unspecified: Secondary | ICD-10-CM | POA: Diagnosis not present

## 2024-03-06 DIAGNOSIS — E039 Hypothyroidism, unspecified: Secondary | ICD-10-CM | POA: Diagnosis not present

## 2024-03-06 DIAGNOSIS — Z170 Estrogen receptor positive status [ER+]: Secondary | ICD-10-CM | POA: Diagnosis not present

## 2024-03-06 DIAGNOSIS — I10 Essential (primary) hypertension: Secondary | ICD-10-CM | POA: Diagnosis not present

## 2024-03-06 DIAGNOSIS — Z6832 Body mass index (BMI) 32.0-32.9, adult: Secondary | ICD-10-CM

## 2024-03-06 LAB — LIPID PANEL

## 2024-03-06 MED ORDER — FUROSEMIDE 20 MG PO TABS: 20.0000 mg | ORAL_TABLET | Freq: Every day | ORAL | 1 refills | Status: DC

## 2024-03-06 MED ORDER — CLONIDINE HCL 0.1 MG PO TABS: 0.1000 mg | ORAL_TABLET | Freq: Two times a day (BID) | ORAL | 1 refills | Status: DC

## 2024-03-06 NOTE — Progress Notes (Signed)
 Subjective:    Patient ID: Bonnie Tanner, female    DOB: November 06, 1940, 83 y.o.   MRN: [id number]   Chief Complaint: medical management of chronic issues     HPI:  Bonnie Tanner is a 83 y.o. who identifies as a female who was assigned female at birth.   Social history: Lives with: husband Work history: hairdresser   Comes in today for follow up of the following chronic medical issues:  1. ESSENTIAL HYPERTENSION, BENIGN No c/o chest pain, sob or headache. Does not check blood pressure at home. She stopped her amlodipine  because of peripheral edema.blood pressure at home is normal when she doe snot eat any extra salt. BP Readings from Last 3 Encounters:  02/25/24 (!) 155/54  01/14/24 (!) 119/58  12/21/23 127/60     2. Hyperlipidemia LDL goal <100 Does try to watch diet but does no dedicated exercise. Lab Results  Component Value Date   CHOL 167 08/13/2023   HDL 72 08/13/2023   LDLCALC 78 08/13/2023   TRIG 97 08/13/2023   CHOLHDL 2.3 08/13/2023     3. Acquired hypothyroidism No issues  that she is aware of. Lab Results  Component Value Date   TSH 3.080 08/13/2023     4. Coronary artery disease due to lipid rich plaque Does not see cardiology.  5. Gastroesophageal reflux disease without esophagitis Is on omerpazole and is doing well.   6. Malignant neoplasm of right breast in female, estrogen receptor positive, unspecified site of breast (HCC) Had breast cancer in 2012. Has had no reoccurrence.  7. BMI 32.0-32.9,adult No recent weight changes.  Wt Readings from Last 3 Encounters:  03/06/24 145 lb (65.8 kg)  02/25/24 145 lb (65.8 kg)  01/14/24 147 lb (66.7 kg)   BMI Readings from Last 3 Encounters:  03/06/24 29.29 kg/m  02/25/24 29.29 kg/m  01/14/24 29.69 kg/m       New complaints: None  today  Allergies  Allergen Reactions   Erythromycin  Swelling   Latex Other (See Comments)    Skin gets real red   Nickel     Other reaction(s): Other  (See Comments) blisters   Penicillins Hives   Sulfonamide Derivatives Rash   Outpatient Encounter Medications as of 03/06/2024  Medication Sig   amLODipine  (NORVASC ) 5 MG tablet Take 1 tablet (5 mg total) by mouth daily. (Patient not taking: Reported on 02/25/2024)   aspirin 325 MG tablet Take 325 mg by mouth daily.   atorvastatin  (LIPITOR) 40 MG tablet Take 1 tablet (40 mg total) by mouth daily.   bacitracin -polymyxin b  (POLYSPORIN ) ophthalmic ointment Place 1 Application into the right eye 3 (three) times daily. (Patient not taking: Reported on 02/25/2024)   cholecalciferol (VITAMIN D) 1000 UNITS tablet Take 1,000 Units by mouth daily.   ciprofloxacin  (CIPRO ) 500 MG tablet Take 1 tablet (500 mg total) by mouth 2 (two) times daily. (Patient not taking: Reported on 02/25/2024)   cloNIDine  (CATAPRES ) 0.1 MG tablet TAKE 1 TABLET BY MOUTH TWICE  DAILY   doxycycline  (VIBRA -TABS) 100 MG tablet Take 1 tablet (100 mg total) by mouth 2 (two) times daily. 1 po bid (Patient not taking: Reported on 02/25/2024)   furosemide  (LASIX ) 20 MG tablet Take 1 tablet (20 mg total) by mouth daily.   Glucosamine-Chondroit-Vit C-Mn (GLUCOSAMINE 1500 COMPLEX PO) Take by mouth.   levothyroxine  (SYNTHROID ) 88 MCG tablet TAKE 1 TABLET BY MOUTH DAILY  BEFORE BREAKFAST   lisinopril  (ZESTRIL ) 40 MG tablet TAKE 1 TABLET BY  MOUTH DAILY   multivitamin-iron-minerals-folic acid (CENTRUM) chewable tablet Chew 1 tablet by mouth daily.   nystatin  cream (MYCOSTATIN ) Apply 1 application topically 2 (two) times daily.   omeprazole  (PRILOSEC) 40 MG capsule TAKE 1 CAPSULE BY MOUTH DAILY   trimethoprim -polymyxin b  (POLYTRIM ) ophthalmic solution Place 1 drop into the left eye every 6 (six) hours.   verapamil  (CALAN ) 120 MG tablet TAKE 1 TABLET BY MOUTH DAILY   No facility-administered encounter medications on file as of 03/06/2024.    Past Surgical History:  Procedure Laterality Date   BREAST LUMPECTOMY  04/19/2010   radiation therapy  right breast   CARDIAC CATHETERIZATION  09/2009   CATARACT EXTRACTION  2006   bilateral   EYE SURGERY  2000   made her ducts    Family History  Problem Relation Age of Onset   Heart disease Mother 49   Emphysema Father    Heart disease Father    Throat cancer Brother    Stroke Brother    Healthy Son    Prostate cancer Paternal Uncle    Stomach cancer Paternal Uncle       Controlled substance contract: n/a     Review of Systems  Constitutional:  Negative for diaphoresis.  Eyes:  Negative for pain.  Respiratory:  Negative for shortness of breath.   Cardiovascular:  Negative for chest pain, palpitations and leg swelling.  Gastrointestinal:  Negative for abdominal pain.  Endocrine: Negative for polydipsia.  Skin:  Negative for rash.  Neurological:  Negative for dizziness, weakness and headaches.  Hematological:  Does not bruise/bleed easily.  All other systems reviewed and are negative.      Objective:   Physical Exam Vitals and nursing note reviewed.  Constitutional:      General: She is not in acute distress.    Appearance: Normal appearance. She is well-developed.  HENT:     Head: Normocephalic.     Right Ear: Tympanic membrane normal.     Left Ear: Tympanic membrane normal.     Nose: Nose normal.     Mouth/Throat:     Mouth: Mucous membranes are moist.  Eyes:     Pupils: Pupils are equal, round, and reactive to light.  Neck:     Vascular: No carotid bruit or JVD.  Cardiovascular:     Rate and Rhythm: Normal rate and regular rhythm.     Heart sounds: Normal heart sounds.  Pulmonary:     Effort: Pulmonary effort is normal. No respiratory distress.     Breath sounds: Normal breath sounds. No wheezing or rales.  Chest:     Chest wall: No tenderness.  Abdominal:     General: Bowel sounds are normal. There is no distension or abdominal bruit.     Palpations: Abdomen is soft. There is no hepatomegaly, splenomegaly, mass or pulsatile mass.     Tenderness:  There is no abdominal tenderness.  Musculoskeletal:        General: Normal range of motion.     Cervical back: Normal range of motion and neck supple.  Lymphadenopathy:     Cervical: No cervical adenopathy.  Skin:    General: Skin is warm and dry.  Neurological:     Mental Status: She is alert and oriented to person, place, and time.     Deep Tendon Reflexes: Reflexes are normal and symmetric.  Psychiatric:        Behavior: Behavior normal.        Thought Content: Thought content normal.  Judgment: Judgment normal.     BP (!) 145/66   Pulse (!) 56   Temp 98.1 F (36.7 C) (Temporal)   Ht 4\' 11"  (1.499 m)   Wt 145 lb (65.8 kg)   SpO2 99%   BMI 29.29 kg/m          Assessment & Plan:   Bonnie Tanner comes in today with chief complaint of medical management of chronic issues    Diagnosis and orders addressed:  1. ESSENTIAL HYPERTENSION, BENIGN Low sodium diet Added clonidine  0.1mg  bid - lisinopril  (ZESTRIL ) 40 MG tablet; Take 1 tablet (40 mg total) by mouth daily.  Dispense: 90 tablet; Refill: 1 - CBC with Differential/Platelet - CMP14+EGFR  2. Hyperlipidemia LDL goal <100 Low fat diet - atorvastatin  (LIPITOR) 40 MG tablet; Take 1 tablet (40 mg total) by mouth daily.  Dispense: 90 tablet; Refill: 1 - Lipid panel  3. Acquired hypothyroidism Labs pending - levothyroxine  (SYNTHROID ) 88 MCG tablet; Take 1 tablet (88 mcg total) by mouth daily before breakfast.  Dispense: 90 tablet; Refill: 1 - Thyroid  Panel With TSH  4. Coronary artery disease due to lipid rich plaque - verapamil  (CALAN ) 120 MG tablet; Take 1 tablet (120 mg total) by mouth daily.  Dispense: 90 tablet; Refill: 1  5. Gastroesophageal reflux disease without esophagitis Avoid spicy foods Do not eat 2 hours prior to bedtime - omeprazole  (PRILOSEC) 40 MG capsule; Take 1 capsule (40 mg total) by mouth daily.  Dispense: 90 capsule; Refill: 1  6. Malignant neoplasm of right breast in female,  estrogen receptor positive, unspecified site of breast (HCC)   7. BMI 32.0-32.9,adult Discussed diet and exercise for person with BMI >25 Will recheck weight in 3-6 months   Labs pending Health Maintenance reviewed Diet and exercise encouraged  Follow up plan: 6 months   Mary-Margaret Gaylyn Keas, FNP

## 2024-03-07 ENCOUNTER — Ambulatory Visit: Payer: Self-pay | Admitting: Nurse Practitioner

## 2024-03-07 LAB — LIPID PANEL
Cholesterol, Total: 137 mg/dL (ref 100–199)
HDL: 66 mg/dL (ref >39)
LDL CALC COMMENT:: 2.1 ratio (ref 0.0–4.4)
LDL Chol Calc (NIH): 55 mg/dL (ref 0–99)
Triglycerides: 84 mg/dL (ref 0–149)
VLDL Cholesterol Cal: 16 mg/dL (ref 5–40)

## 2024-03-07 LAB — CMP14+EGFR
ALT: 58 IU/L — ABNORMAL HIGH (ref 0–32)
AST: 39 IU/L (ref 0–40)
Albumin: 4.1 g/dL (ref 3.8–4.8)
Alkaline Phosphatase: 150 IU/L — ABNORMAL HIGH (ref 44–121)
BUN/Creatinine Ratio: 20 (ref 12–28)
BUN: 16 mg/dL (ref 8–27)
Bilirubin Total: 0.3 mg/dL (ref 0.0–1.2)
CO2: 22 mmol/L (ref 20–29)
Calcium: 10.3 mg/dL (ref 8.7–10.3)
Chloride: 106 mmol/L (ref 96–106)
Creatinine, Ser: 0.81 mg/dL (ref 0.57–1.00)
Globulin, Total: 2.2 g/dL (ref 1.5–4.5)
Glucose: 76 mg/dL (ref 70–99)
Potassium: 4.1 mmol/L (ref 3.5–5.2)
Sodium: 142 mmol/L (ref 134–144)
Total Protein: 6.3 g/dL (ref 6.0–8.5)
eGFR: 73 mL/min/{1.73_m2} (ref >59)

## 2024-03-07 LAB — CBC WITH DIFFERENTIAL/PLATELET
Basophils Absolute: 0.1 10*3/uL (ref 0.0–0.2)
Basos: 1 %
EOS (ABSOLUTE): 0.2 10*3/uL (ref 0.0–0.4)
Eos: 4 %
Hematocrit: 38.3 % (ref 34.0–46.6)
Hemoglobin: 12.5 g/dL (ref 11.1–15.9)
Immature Grans (Abs): 0 10*3/uL (ref 0.0–0.1)
Immature Granulocytes: 1 %
Lymphocytes Absolute: 2.1 10*3/uL (ref 0.7–3.1)
Lymphs: 34 %
MCH: 33.7 pg — ABNORMAL HIGH (ref 26.6–33.0)
MCHC: 32.6 g/dL (ref 31.5–35.7)
MCV: 103 fL — ABNORMAL HIGH (ref 79–97)
Monocytes Absolute: 0.4 10*3/uL (ref 0.1–0.9)
Monocytes: 6 %
Neutrophils Absolute: 3.5 10*3/uL (ref 1.4–7.0)
Neutrophils: 54 %
Platelets: 245 10*3/uL (ref 150–450)
RBC: 3.71 x10E6/uL — ABNORMAL LOW (ref 3.77–5.28)
RDW: 12.1 % (ref 11.7–15.4)
WBC: 6.3 10*3/uL (ref 3.4–10.8)

## 2024-03-07 LAB — THYROID PANEL WITH TSH
Free Thyroxine Index: 2.7 (ref 1.2–4.9)
T3 Uptake Ratio: 29 % (ref 24–39)
T4, Total: 9.3 ug/dL (ref 4.5–12.0)
TSH: 2.89 u[IU]/mL (ref 0.450–4.500)

## 2024-03-14 ENCOUNTER — Ambulatory Visit: Payer: Self-pay

## 2024-03-14 NOTE — Telephone Encounter (Signed)
 FYI Only or Action Required?: FYI only for provider  Patient was last seen in primary care on 03/06/2024 by Delfina Feller, FNP. Called Nurse Triage reporting Rash. Symptoms began yesterday. Interventions attempted: OTC medications: hydrocortisone. Symptoms are: stable.  Triage Disposition: See PCP When Office is Open (Within 3 Days)  Patient/caregiver understands and will follow disposition?:                                   Copied From CRM [id number]. Reason for Triage: Please give patient a call. She has a rash on her leg that started two days ago and now it's  her arm. She's not sure if it's from a medication or not. Please call back #[phone]   Reason for Disposition  Localized rash present > 7 days    Rash has only been present for 1-2 days, but patient requested to see a provider on Monday.  Answer Assessment - Initial Assessment Questions 1. APPEARANCE of RASH: "Describe the rash."      Left arm- "forming", looks like it has "water" in it, right leg- bigger, round and red/darker color 2. LOCATION: "Where is the rash located?"      Right leg and left arm 3. NUMBER: "How many spots are there?"      2 4. SIZE: "How big are the spots?" (Inches, centimeters or compare to size of a coin)      "Size of a pill" 5. ONSET: "When did the rash start?"      Leg yesterday, arm today 6. ITCHING: "Does the rash itch?" If Yes, ask: "How bad is the itch?"  (Scale 0-10; or none, mild, moderate, severe)     Spot on leg is "bothering" her more than the spot on her arm 7. PAIN: "Does the rash hurt?" If Yes, ask: "How bad is the pain?"  (Scale 0-10; or none, mild, moderate, severe)    - NONE (0): no pain    - MILD (1-3): doesn't interfere with normal activities     - MODERATE (4-7): interferes with normal activities or awakens from sleep     - SEVERE (8-10): excruciating pain, unable to do any normal activities     Denies pain, sates itching a 7-8 8. OTHER  SYMPTOMS: "Do you have any other symptoms?" (e.g., fever)     Red in color, two small blisters next to spot on leg Denies being outside, denies being bitten by an insect, denies fever, denies headache Been off of doxycycline  for a couple of weeks Concerned about shingles Denies difficulty breathing, denies chest pain  Protocols used: Rash or Redness - Localized-A-AH

## 2024-03-17 ENCOUNTER — Ambulatory Visit: Admitting: Family

## 2024-06-23 ENCOUNTER — Other Ambulatory Visit: Payer: Self-pay | Admitting: Nurse Practitioner

## 2024-06-23 DIAGNOSIS — I10 Essential (primary) hypertension: Secondary | ICD-10-CM

## 2024-06-23 DIAGNOSIS — E039 Hypothyroidism, unspecified: Secondary | ICD-10-CM

## 2024-06-23 DIAGNOSIS — K219 Gastro-esophageal reflux disease without esophagitis: Secondary | ICD-10-CM

## 2024-06-23 DIAGNOSIS — I2510 Atherosclerotic heart disease of native coronary artery without angina pectoris: Secondary | ICD-10-CM

## 2024-07-02 ENCOUNTER — Encounter: Payer: Self-pay | Admitting: Family Medicine

## 2024-07-02 ENCOUNTER — Ambulatory Visit: Payer: Self-pay

## 2024-07-02 ENCOUNTER — Ambulatory Visit (INDEPENDENT_AMBULATORY_CARE_PROVIDER_SITE_OTHER): Admitting: Family Medicine

## 2024-07-02 VITALS — BP 161/72 | HR 58 | Temp 98.1°F | Ht 59.0 in | Wt 141.8 lb

## 2024-07-02 DIAGNOSIS — G250 Essential tremor: Secondary | ICD-10-CM | POA: Diagnosis not present

## 2024-07-02 DIAGNOSIS — I10 Essential (primary) hypertension: Secondary | ICD-10-CM

## 2024-07-02 DIAGNOSIS — Z23 Encounter for immunization: Secondary | ICD-10-CM | POA: Diagnosis not present

## 2024-07-02 MED ORDER — VERAPAMIL HCL ER 180 MG PO TBCR: 180.0000 mg | EXTENDED_RELEASE_TABLET | Freq: Every day | ORAL | 0 refills | Status: DC

## 2024-07-02 MED ORDER — VERAPAMIL HCL ER 180 MG PO TBCR
180.0000 mg | EXTENDED_RELEASE_TABLET | Freq: Every day | ORAL | 0 refills | Status: DC
Start: 2024-07-02 — End: 2024-08-21

## 2024-07-02 NOTE — Telephone Encounter (Signed)
 FYI Only or Action Required?: FYI only for provider.  Patient was last seen in primary care on 03/06/2024 by Gladis Mustard, FNP.  Called Nurse Triage reporting No chief complaint on file..  Interventions attempted: Rest, hydration, or home remedies. And Rx antihypertnsives.  Symptoms are: unchanged.  Triage Disposition: See PCP Within 2 Weeks  Patient/caregiver understands and will follow disposition?: Yes Reason for Disposition  [1] Systolic BP >= 130 OR Diastolic >= 80 AND [2] taking BP medications  Answer Assessment - Initial Assessment Questions Patient reports BP fluctuations. States took BP after meds this morning and systolic is 148.  1. BLOOD PRESSURE: What is your blood pressure? Did you take at least two measurements 5 minutes apart?     169-200, states diastolic remains in the 60s  2. ONSET: When did you take your blood pressure?     Daily   3. HOW: How did you take your blood pressure? (e.g., automatic home BP monitor, visiting nurse)     Machine  4. HISTORY: Do you have a history of high blood pressure?     Yes  5. MEDICATIONS:     States is compliant with listed medications     6. OTHER SYMPTOMS: Do you have any symptoms? (e.g., blurred vision, chest pain, difficulty breathing, headache, weakness)     Sometimes experiences a dull headache  Protocols used: Blood Pressure - High-A-AH Copied from CRM #[id number]. Topic: Clinical - Red Word Triage >> Jul 02, 2024  9:11 AM Tonda B wrote: [facility] that prompted transfer to Nurse Triage: blood pressure keeps going up and down the last time she checked it was 169/69

## 2024-07-02 NOTE — Progress Notes (Signed)
 Acute Office Visit  Subjective:     Patient ID: Bonnie Tanner, female    DOB: [birth year], 83 y.o.   MRN: [id number]  Chief Complaint  Patient presents with   Fluctuating Blood Pressure    (Approx 3 weeks)    HPI  History of Present Illness   [name] is an 83 year old female with hypertension who presents with fluctuating blood pressure readings.  Blood pressure fluctuations and associated symptoms - Blood pressure fluctuates between 150s to 160s systolic over 70s diastolic over the past few weeks - No changes in antihypertensive medication regimen - No missed medication doses - Clonidine  0.1 mg taken twice daily - Other antihypertensive medications include Lasix  20 mg, lisinopril  40 mg, and verapamil  120 mg - Dull headaches attributed to elevated blood pressure - No chest pain, shortness of breath, changes in vision, or dizziness  Tremors - Tremor in hand present, particularly when holding objects such as a songbook or coffee cup - Family history of tremors  Medication allergy - Allergy to amlodipine , previously caused significant swelling in feet and legs, redness, and dry, scaly skin       ROS As per HPI.      Objective:    BP (!) 161/72   Pulse (!) 58   Temp 98.1 F (36.7 C)   Ht 4' 11 (1.499 m)   Wt 141 lb 12.8 oz (64.3 kg)   SpO2 100%   BMI 28.64 kg/m  BP Readings from Last 3 Encounters:  07/02/24 (!) 161/72  03/06/24 (!) 145/66  02/25/24 (!) 155/54      Physical Exam Vitals and nursing note reviewed.  Constitutional:      General: She is not in acute distress.    Appearance: She is not ill-appearing, toxic-appearing or diaphoretic.  Cardiovascular:     Rate and Rhythm: Normal rate and regular rhythm.     Heart sounds: Normal heart sounds. No murmur heard. Pulmonary:     Effort: Pulmonary effort is normal. No respiratory distress.     Breath sounds: Normal breath sounds. No wheezing, rhonchi or rales.  Musculoskeletal:     Cervical  back: Neck supple. No rigidity.     Right lower leg: No edema.     Left lower leg: No edema.  Skin:    General: Skin is warm and dry.  Neurological:     General: No focal deficit present.     Mental Status: She is alert and oriented to person, place, and time.     Motor: No weakness or tremor.  Psychiatric:        Mood and Affect: Mood normal.        Behavior: Behavior normal.     No results found for any visits on 07/02/24.      Assessment & Plan:   Bonnie Tanner was seen today for fluctuating blood pressure.  Diagnoses and all orders for this visit:  ESSENTIAL HYPERTENSION, BENIGN -     Discontinue: verapamil  (CALAN -SR) 180 MG CR tablet; Take 1 tablet (180 mg total) by mouth at bedtime. -     verapamil  (CALAN -SR) 180 MG CR tablet; Take 1 tablet (180 mg total) by mouth at bedtime.  Essential tremor  Encounter for immunization -     Flu vaccine HIGH DOSE PF(Fluzone Trivalent)       Hypertension Hypertension with elevated readings, allergic to amlodipine . - Increase verapamil  to 180 mg daily. Continue lisinopril , lasix .  - Send 30-day supply to [facility], 90-day  supply to [facility]. - Schedule follow-up in two weeks for blood pressure recheck.  Tremor Benign.      Return in about 2 weeks (around 07/16/2024) for with PCP for HTN.  The patient indicates understanding of these issues and agrees with the plan.  Bonnie CHRISTELLA Search, FNP

## 2024-07-02 NOTE — Telephone Encounter (Signed)
 Apt scheduled.

## 2024-07-03 ENCOUNTER — Telehealth: Payer: Self-pay | Admitting: Nurse Practitioner

## 2024-07-03 NOTE — Telephone Encounter (Signed)
 Copied from CRM [id number]. Topic: Clinical - Medication Question >> Jul 03, 2024  2:24 PM Avram MATSU wrote: Reason for CRM: pt is calling about her medication, she's not sure which one she should stop taking. It went from 120 to 180. verapamil  (CALAN -SR) 180 MG CR tablet [[id number]]   Please advise [phone] (M)

## 2024-07-04 NOTE — Telephone Encounter (Signed)
 Left detailed message making patient aware that the 120mg  was d/c and patient is to take the 180mg  of Verapamil .

## 2024-07-07 DIAGNOSIS — Z1231 Encounter for screening mammogram for malignant neoplasm of breast: Secondary | ICD-10-CM | POA: Diagnosis not present

## 2024-07-07 LAB — HM MAMMOGRAPHY

## 2024-07-14 ENCOUNTER — Other Ambulatory Visit: Payer: Self-pay | Admitting: Nurse Practitioner

## 2024-07-14 DIAGNOSIS — R600 Localized edema: Secondary | ICD-10-CM

## 2024-07-21 ENCOUNTER — Telehealth: Payer: Self-pay | Admitting: Family Medicine

## 2024-07-21 NOTE — Telephone Encounter (Signed)
 Copied from CRM [id number]. Topic: Clinical - Lab/Test Results >> Jul 21, 2024  1:50 PM Joesph B wrote: Reason for CRM: patient is calling to get the results from her mammogram.

## 2024-07-21 NOTE — Telephone Encounter (Signed)
 Called and gave patient results per chart. Patient verbalized understanding

## 2024-07-24 ENCOUNTER — Encounter: Payer: Self-pay | Admitting: Nurse Practitioner

## 2024-07-24 ENCOUNTER — Ambulatory Visit: Admitting: Nurse Practitioner

## 2024-07-24 VITALS — BP 155/56 | HR 50 | Temp 97.9°F | Ht 59.0 in | Wt 142.0 lb

## 2024-07-24 DIAGNOSIS — R300 Dysuria: Secondary | ICD-10-CM

## 2024-07-24 DIAGNOSIS — I10 Essential (primary) hypertension: Secondary | ICD-10-CM

## 2024-07-24 LAB — URINALYSIS, ROUTINE W REFLEX MICROSCOPIC
Bilirubin, UA: NEGATIVE
Glucose, UA: NEGATIVE
Ketones, UA: NEGATIVE
Nitrite, UA: NEGATIVE
Protein,UA: NEGATIVE
RBC, UA: NEGATIVE
Specific Gravity, UA: <=1.005 — AB (ref 1.005–1.030)
Urobilinogen, Ur: 0.2 mg/dL (ref 0.2–1.0)
pH, UA: 5.5 (ref 5.0–7.5)

## 2024-07-24 LAB — MICROSCOPIC EXAMINATION
Epithelial Cells (non renal): NONE SEEN /HPF (ref 0–10)
RBC, Urine: NONE SEEN /HPF (ref 0–2)
Yeast, UA: NONE SEEN

## 2024-07-24 MED ORDER — CLONIDINE HCL 0.2 MG PO TABS: 0.2000 mg | ORAL_TABLET | Freq: Two times a day (BID) | ORAL | 1 refills | Status: DC

## 2024-07-24 NOTE — Progress Notes (Signed)
 Subjective:    Patient ID: TAJA PENTLAND, female    DOB: 22-Nov-1940, 83 y.o.   MRN: [id number]   Chief Complaint: Recheck BP   HPI  Patient in today for hypertension. She has been having issues with her blood pressure going up and down. You are currently on lisinopril  40mg , clonidine  0.1 BID and verapamil  180mg  daily. She checks her blood pressure several times during the day and in middle of night. Seems to be highest in middle of night.  BP Readings from Last 3 Encounters:  07/24/24 (!) 155/56  07/02/24 (!) 161/72  03/06/24 (!) 145/66    Also c/o dysuria that started last night. No fever, no back pain, no hematuria.   Patient Active Problem List   Diagnosis Date Noted   BMI 32.0-32.9,adult 05/31/2015   GERD (gastroesophageal reflux disease) 07/13/2014   Ductal carcinoma in situ of right breast 09/19/2011   Breast cancer (HCC) 12/24/2010   Hyperlipidemia LDL goal <100 12/08/2009   ESSENTIAL HYPERTENSION, BENIGN 12/08/2009   CAD (coronary artery disease) 12/08/2009   Hypothyroidism 12/07/2009       Review of Systems  Constitutional:  Negative for diaphoresis.  Eyes:  Negative for pain.  Respiratory:  Negative for shortness of breath.   Cardiovascular:  Negative for chest pain, palpitations and leg swelling.  Gastrointestinal:  Negative for abdominal pain.  Endocrine: Negative for polydipsia.  Skin:  Negative for rash.  Neurological:  Negative for dizziness, weakness and headaches.  Hematological:  Does not bruise/bleed easily.  All other systems reviewed and are negative.      Objective:   Physical Exam Vitals and nursing note reviewed.  Constitutional:      General: She is not in acute distress.    Appearance: Normal appearance. She is well-developed.  Neck:     Vascular: No carotid bruit or JVD.  Cardiovascular:     Rate and Rhythm: Normal rate and regular rhythm.     Heart sounds: Normal heart sounds.  Pulmonary:     Effort: Pulmonary effort is normal.  No respiratory distress.     Breath sounds: Normal breath sounds. No wheezing or rales.  Chest:     Chest wall: No tenderness.  Abdominal:     General: Bowel sounds are normal. There is no distension or abdominal bruit.     Palpations: Abdomen is soft. There is no hepatomegaly, splenomegaly, mass or pulsatile mass.     Tenderness: There is no abdominal tenderness.  Musculoskeletal:        General: Normal range of motion.     Cervical back: Normal range of motion and neck supple.  Lymphadenopathy:     Cervical: No cervical adenopathy.  Skin:    General: Skin is warm and dry.  Neurological:     Mental Status: She is alert and oriented to person, place, and time.     Deep Tendon Reflexes: Reflexes are normal and symmetric.  Psychiatric:        Behavior: Behavior normal.        Thought Content: Thought content normal.        Judgment: Judgment normal.    BP (!) 155/56   Pulse (!) 50   Temp 97.9 F (36.6 C) (Temporal)   Ht 4' 11 (1.499 m)   Wt 142 lb (64.4 kg)   SpO2 98%   BMI 28.68 kg/m         Assessment & Plan:   Erminio JONELLE Hurst in today with chief complaint of  Recheck BP   1. Dysuria (Primary) Force fluids Culture pending - Urinalysis, Routine w reflex microscopic - Urine Culture  2. ESSENTIAL HYPERTENSION, BENIGN otherwise sodium diet Increase clonidine  to0.2mg  BID Take blood pressure after sitting for 10 minutes Continue to keep a diary of blood pressure at home. - cloNIDine  (CATAPRES ) 0.2 MG tablet; Take 1 tablet (0.2 mg total) by mouth 2 (two) times daily.  Dispense: 180 tablet; Refill: 1    The above assessment and management plan was discussed with the patient. The patient verbalized understanding of and has agreed to the management plan. Patient is aware to call the clinic if symptoms persist or worsen. Patient is aware when to return to the clinic for a follow-up visit. Patient educated on when it is appropriate to go to the emergency department.    Mary-Margaret Gladis, FNP

## 2024-07-24 NOTE — Patient Instructions (Signed)

## 2024-07-25 ENCOUNTER — Ambulatory Visit: Payer: Self-pay | Admitting: Nurse Practitioner

## 2024-07-27 LAB — URINE CULTURE

## 2024-07-28 ENCOUNTER — Telehealth: Payer: Self-pay | Admitting: Family Medicine

## 2024-07-28 MED ORDER — CIPROFLOXACIN HCL 500 MG PO TABS: 500.0000 mg | ORAL_TABLET | Freq: Two times a day (BID) | ORAL | 0 refills | Status: DC

## 2024-07-28 NOTE — Addendum Note (Signed)
 Addended by: Francisca Langenderfer, MARY-MARGARET on: 07/28/2024 04:30 PM   Modules accepted: Orders

## 2024-07-28 NOTE — Telephone Encounter (Signed)
 Copied from CRM #[id number]. Topic: Clinical - Prescription Issue >> Jul 28, 2024  1:44 PM Everette C wrote: Reason for CRM: The patient has called to notify the practice that they would like their recently discussed medications submitted to the pharmacy at   [id number] - [place],  - [address] [place] [address] Phone: [phone] Fax: [phone] Hours: Not open 24 hours  Please contact the patient further if needed

## 2024-08-04 ENCOUNTER — Ambulatory Visit: Admitting: Nurse Practitioner

## 2024-08-13 ENCOUNTER — Ambulatory Visit: Payer: Self-pay

## 2024-08-13 NOTE — Telephone Encounter (Signed)
 1st attempt to contact patient. No answer. Unable to leave message due to voicemail not being set up. Routing for additional attempts.        Summary: lower back discomfort / rx req  Reason for Triage: The patient would like to be contacted to discuss the options they can take for over the counter medications to help with their lower back discomfort.

## 2024-08-13 NOTE — Telephone Encounter (Signed)
      FYI Only or Action Required?: FYI only for provider: Home Care.  Patient was last seen in primary care on 07/24/2024 by Gladis Mustard, FNP.  Called Nurse Triage reporting Back Pain.  Symptoms began several weeks ago.  Interventions attempted: Rest, hydration, or home remedies.  Symptoms are: stable.  Triage Disposition: Home Care  Patient/caregiver understands and will follow disposition?: Yes  Patient reports mild lower back pain for the last several weeks. Patient requesting information on OTC medication. Patient given information on tylenol. Patient verbalized understanding.   Reason for Disposition  Back pain  Answer Assessment - Initial Assessment Questions 1. ONSET: When did the pain begin? (e.g., minutes, hours, days)     Several weeks 2. LOCATION: Where does it hurt? (upper, mid or lower back)     Lower back 3. SEVERITY: How bad is the pain?  (e.g., Scale 1-10; mild, moderate, or severe)     Mild pain currently 4. PATTERN: Is the pain constant? (e.g., yes, no; constant, intermittent)      intermittent 5. RADIATION: Does the pain shoot into your legs or somewhere else?     No radiation 6. CAUSE:  What do you think is causing the back pain?      unsure 7. BACK OVERUSE:  Any recent lifting of heavy objects, strenuous work or exercise?     no 8. MEDICINES: What have you taken so far for the pain? (e.g., nothing, acetaminophen, NSAIDS)     no 9. NEUROLOGIC SYMPTOMS: Do you have any weakness, numbness, or problems with bowel/bladder control?     no 10. OTHER SYMPTOMS: Do you have any other symptoms? (e.g., fever, abdomen pain, burning with urination, blood in urine)       no  Protocols used: Back Pain-A-AH

## 2024-08-13 NOTE — Telephone Encounter (Signed)
 Noted

## 2024-08-21 ENCOUNTER — Ambulatory Visit (INDEPENDENT_AMBULATORY_CARE_PROVIDER_SITE_OTHER): Admitting: Family Medicine

## 2024-08-21 ENCOUNTER — Encounter: Payer: Self-pay | Admitting: Family Medicine

## 2024-08-21 VITALS — BP 147/58 | HR 98 | Temp 97.5°F | Wt 139.0 lb

## 2024-08-21 DIAGNOSIS — R002 Palpitations: Secondary | ICD-10-CM | POA: Diagnosis not present

## 2024-08-21 DIAGNOSIS — I10 Essential (primary) hypertension: Secondary | ICD-10-CM

## 2024-08-21 MED ORDER — VERAPAMIL HCL ER 240 MG PO TBCR: 240.0000 mg | EXTENDED_RELEASE_TABLET | Freq: Every day | ORAL | 0 refills | Status: DC

## 2024-08-21 NOTE — Progress Notes (Signed)
 Subjective:  Patient ID: Bonnie Tanner, female    DOB: 09/11/1941  Age: 83 y.o. MRN: [id number]  CC: Hypertension (Bp has high blood pressure in the morning but it normally goes down after taking meds but it did not today. Recently taken off one of her BP due to swelling and leg pain.)   HPI  Discussed the use of AI scribe software for clinical note transcription with the patient, who gave verbal consent to proceed.  History of Present Illness Bonnie Tanner is an 83 year old female with hypertension who presents with poorly controlled blood pressure.  She experiences episodes of elevated blood pressure, with readings reaching as high as 201 mmHg, particularly during the night. She attributes these episodes to her diet, stating that everything she eats affects her blood pressure. She frequently checks her blood pressure, especially when feeling unwell or strange.  Her current medication regimen includes clonidine  0.2 mg twice daily, lisinopril  once daily, and verapamil  180 mg at bedtime. She occasionally takes an additional 0.1 mg of clonidine  at night if her blood pressure is elevated. Despite adherence to her medication schedule, she experiences frequent urination throughout the day and night after switching Lasix  dosing to the morning. She notes mild ankle swelling, which resolves overnight.  She experiences palpitations and a pounding heart, particularly in the early morning when her blood pressure is elevated. No chest pain is associated with her high blood pressure.  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 She has not noticed a direct correlation between her work schedule and her blood pressure readings.  She has a family history of heart issues, as her mother experienced similar symptoms and worked until her heart  condition necessitated retirement.          08/21/2024    2:51 PM 07/24/2024    2:20 PM 07/02/2024   11:35 AM  Depression screen PHQ 2/9  Decreased Interest 0 0 0  Down, Depressed, Hopeless 0 0 0  PHQ - 2 Score 0 0 0  Altered sleeping 0  0  Tired, decreased energy 0  0  Change in appetite 0  0  Feeling bad or failure about yourself  0  0  Trouble concentrating 0  0  Moving slowly or fidgety/restless 0  0  Suicidal thoughts 0  0  PHQ-9 Score 0  0   Difficult doing work/chores Not difficult at all  Not difficult at all     Data saved with a previous flowsheet row definition    History Bonnie Tanner has a past medical history of Arthritis, CAD (coronary artery disease), Cancer (HCC) (2011), GERD (gastroesophageal reflux disease), Heart murmur, Hyperlipidemia, Hypertension, and Hypothyroidism.   She has a past surgical history that includes Cardiac catheterization (09/2009); Cataract extraction (2006); Breast lumpectomy (04/19/2010); and Eye surgery (2000).   Her family history includes Emphysema in her father; Healthy in her son; Heart disease in her father; Heart disease (age of onset: 19) in her mother; Prostate cancer in her paternal uncle; Stomach cancer in her paternal uncle; Stroke in her brother; Throat cancer in her brother.She reports that she has never smoked. She has never used smokeless tobacco. She reports that she does not drink alcohol and does not use drugs.    ROS Review of Systems  Constitutional:  Negative.   HENT:  Negative for congestion.   Eyes:  Negative for visual disturbance.  Respiratory:  Negative for shortness of breath.   Cardiovascular:  Negative for chest pain.  Gastrointestinal:  Negative for abdominal pain, constipation, diarrhea, nausea and vomiting.  Genitourinary:  Negative for difficulty urinating.  Musculoskeletal:  Negative for arthralgias and myalgias.  Neurological:  Negative for headaches.  Psychiatric/Behavioral:  Negative for sleep  disturbance.     Objective:  BP (!) 147/58   Pulse 98   Temp (!) 97.5 F (36.4 C)   Wt 139 lb (63 kg)   SpO2 99%   BMI 28.07 kg/m   BP Readings from Last 3 Encounters:  08/21/24 (!) 147/58  07/24/24 (!) 155/56  07/02/24 (!) 161/72    Wt Readings from Last 3 Encounters:  08/21/24 139 lb (63 kg)  07/24/24 142 lb (64.4 kg)  07/02/24 141 lb 12.8 oz (64.3 kg)     Physical Exam Physical Exam GENERAL: Alert, cooperative, well developed, no acute distress HEENT: Normocephalic, normal oropharynx, moist mucous membranes CHEST: Clear to auscultation bilaterally, No wheezes, rhonchi, or crackles CARDIOVASCULAR: Normal heart rate and rhythm, S1 and S2 normal without murmurs ABDOMEN: Soft, non-tender, non-distended, without organomegaly, Normal bowel sounds EXTREMITIES: No cyanosis or edema NEUROLOGICAL: Cranial nerves grossly intact, Moves all extremities without gross motor or sensory deficit   Assessment & Plan:  Accelerated hypertension -     TSH + free T4 -     CBC with Differential/Platelet -     CMP14+EGFR  ESSENTIAL HYPERTENSION, BENIGN -     Verapamil  HCl ER; Take 1 tablet (240 mg total) by mouth at bedtime.  Dispense: 90 tablet; Refill: 0  Palpitation -     TSH + free T4 -     CBC with Differential/Platelet -     CMP14+EGFR    Assessment and Plan Assessment & Plan Essential hypertension with palpitations   Hypertension presents with episodes of elevated blood pressure up to 201 mmHg and palpitations, especially in the early morning. She currently takes clonidine  0.2 mg twice daily and verapamil  180 mg at bedtime. Clonidine  adherence is crucial to prevent hypertension exacerbation. Verapamil  dose adjustment is necessary to manage blood pressure and palpitations. 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 No chest pain is reported. Thyroid  function will be evaluated to rule out hyperthyroidism. Increase  verapamil  to 240 mg at bedtime and discontinue the 180 mg dose. Continue clonidine  0.2 mg twice daily and take clonidine  0.1 mg if blood pressure exceeds 180 mmHg overnight. Ordered thyroid  function tests. Follow up with primary care provider in 11 days.       Follow-up: No follow-ups on file.  Butler Der, M.D.

## 2024-08-22 LAB — CMP14+EGFR
ALT: 21 IU/L (ref 0–32)
AST: 23 IU/L (ref 0–40)
Albumin: 4.4 g/dL (ref 3.7–4.7)
Alkaline Phosphatase: 134 IU/L — ABNORMAL HIGH (ref 48–129)
BUN/Creatinine Ratio: 16 (ref 12–28)
BUN: 17 mg/dL (ref 8–27)
Bilirubin Total: 0.3 mg/dL (ref 0.0–1.2)
CO2: 23 mmol/L (ref 20–29)
Calcium: 10.5 mg/dL — ABNORMAL HIGH (ref 8.7–10.3)
Chloride: 101 mmol/L (ref 96–106)
Creatinine, Ser: 1.04 mg/dL — ABNORMAL HIGH (ref 0.57–1.00)
Globulin, Total: 2.4 g/dL (ref 1.5–4.5)
Glucose: 95 mg/dL (ref 70–99)
Potassium: 4.7 mmol/L (ref 3.5–5.2)
Sodium: 139 mmol/L (ref 134–144)
Total Protein: 6.8 g/dL (ref 6.0–8.5)
eGFR: 54 mL/min/1.73 — ABNORMAL LOW (ref >59)

## 2024-08-22 LAB — CBC WITH DIFFERENTIAL/PLATELET
Basophils Absolute: 0.1 x10E3/uL (ref 0.0–0.2)
Basos: 1 %
EOS (ABSOLUTE): 0.2 x10E3/uL (ref 0.0–0.4)
Eos: 4 %
Hematocrit: 38.8 % (ref 34.0–46.6)
Hemoglobin: 12.8 g/dL (ref 11.1–15.9)
Immature Grans (Abs): 0 x10E3/uL (ref 0.0–0.1)
Immature Granulocytes: 0 %
Lymphocytes Absolute: 2 x10E3/uL (ref 0.7–3.1)
Lymphs: 31 %
MCH: 34 pg — ABNORMAL HIGH (ref 26.6–33.0)
MCHC: 33 g/dL (ref 31.5–35.7)
MCV: 103 fL — ABNORMAL HIGH (ref 79–97)
Monocytes Absolute: 0.4 x10E3/uL (ref 0.1–0.9)
Monocytes: 7 %
Neutrophils Absolute: 3.7 x10E3/uL (ref 1.4–7.0)
Neutrophils: 57 %
Platelets: 224 x10E3/uL (ref 150–450)
RBC: 3.76 x10E6/uL — ABNORMAL LOW (ref 3.77–5.28)
RDW: 12.2 % (ref 11.7–15.4)
WBC: 6.4 x10E3/uL (ref 3.4–10.8)

## 2024-08-22 LAB — TSH+FREE T4
Free T4: 1.52 ng/dL (ref 0.82–1.77)
TSH: 2.97 u[IU]/mL (ref 0.450–4.500)

## 2024-08-24 ENCOUNTER — Ambulatory Visit: Payer: Self-pay | Admitting: Family Medicine

## 2024-08-24 ENCOUNTER — Encounter: Payer: Self-pay | Admitting: Family Medicine

## 2024-09-01 ENCOUNTER — Ambulatory Visit: Payer: Self-pay | Admitting: Nurse Practitioner

## 2024-09-01 ENCOUNTER — Encounter: Payer: Self-pay | Admitting: Nurse Practitioner

## 2024-09-01 VITALS — BP 132/64 | HR 72 | Temp 97.4°F | Ht 59.0 in | Wt 138.0 lb

## 2024-09-01 DIAGNOSIS — I10 Essential (primary) hypertension: Secondary | ICD-10-CM | POA: Diagnosis not present

## 2024-09-01 DIAGNOSIS — I2510 Atherosclerotic heart disease of native coronary artery without angina pectoris: Secondary | ICD-10-CM

## 2024-09-01 DIAGNOSIS — Z6832 Body mass index (BMI) 32.0-32.9, adult: Secondary | ICD-10-CM

## 2024-09-01 DIAGNOSIS — K219 Gastro-esophageal reflux disease without esophagitis: Secondary | ICD-10-CM

## 2024-09-01 DIAGNOSIS — E039 Hypothyroidism, unspecified: Secondary | ICD-10-CM

## 2024-09-01 DIAGNOSIS — E785 Hyperlipidemia, unspecified: Secondary | ICD-10-CM

## 2024-09-01 DIAGNOSIS — I2583 Coronary atherosclerosis due to lipid rich plaque: Secondary | ICD-10-CM

## 2024-09-01 DIAGNOSIS — R600 Localized edema: Secondary | ICD-10-CM

## 2024-09-01 DIAGNOSIS — Z86000 Personal history of in-situ neoplasm of breast: Secondary | ICD-10-CM

## 2024-09-01 MED ORDER — LISINOPRIL 40 MG PO TABS: 40.0000 mg | ORAL_TABLET | Freq: Every day | ORAL | 0 refills | Status: AC

## 2024-09-01 MED ORDER — LEVOTHYROXINE SODIUM 88 MCG PO TABS: 88.0000 ug | ORAL_TABLET | Freq: Every day | ORAL | 1 refills | Status: AC

## 2024-09-01 MED ORDER — CLONIDINE HCL 0.2 MG PO TABS: 0.2000 mg | ORAL_TABLET | Freq: Two times a day (BID) | ORAL | 1 refills | Status: AC

## 2024-09-01 MED ORDER — ATORVASTATIN CALCIUM 40 MG PO TABS: 40.0000 mg | ORAL_TABLET | Freq: Every day | ORAL | 1 refills | Status: AC

## 2024-09-01 MED ORDER — VERAPAMIL HCL ER 240 MG PO TBCR: 240.0000 mg | EXTENDED_RELEASE_TABLET | Freq: Every day | ORAL | 1 refills | Status: AC

## 2024-09-01 MED ORDER — FUROSEMIDE 20 MG PO TABS: 20.0000 mg | ORAL_TABLET | Freq: Every day | ORAL | 1 refills | Status: AC

## 2024-09-01 MED ORDER — OMEPRAZOLE 40 MG PO CPDR: 40.0000 mg | DELAYED_RELEASE_CAPSULE | Freq: Every day | ORAL | 1 refills | Status: AC

## 2024-09-01 NOTE — Patient Instructions (Signed)
 Peripheral Edema  Peripheral edema is swelling that is caused by a buildup of fluid. Peripheral edema most often affects the lower legs, ankles, and feet. It can also develop in the arms, hands, and face. The area of the body that has peripheral edema will look swollen. It may also feel heavy or warm. Your clothes may start to feel tight. Pressing on the area may make a temporary dent in your skin (pitting edema). You may not be able to move your swollen arm or leg as much as usual. There are many causes of peripheral edema. It can happen because of a complication of other conditions such as heart failure, kidney disease, or a problem with your circulation. It also can be a side effect of certain medicines or happen because of an infection. It often happens to women during pregnancy. Sometimes, the cause is not known. Follow these instructions at home: Managing pain, stiffness, and swelling  Raise (elevate) your legs while you are sitting or lying down. Move around often to prevent stiffness and to reduce swelling. Do not sit or stand for long periods of time. Do not wear tight clothing. Do not wear garters on your upper legs. Exercise your legs to get your circulation going. This helps to move the fluid back into your blood vessels, and it may help the swelling go down. Wear compression stockings as told by your health care provider. These stockings help to prevent blood clots and reduce swelling in your legs. It is important that these are the correct size. These stockings should be prescribed by your doctor to prevent possible injuries. If elastic bandages or wraps are recommended, use them as told by your health care provider. Medicines Take over-the-counter and prescription medicines only as told by your health care provider. Your health care provider may prescribe medicine to help your body get rid of excess water (diuretic). Take this medicine if you are told to take it. General  instructions Eat a low-salt (low-sodium) diet as told by your health care provider. Sometimes, eating less salt may reduce swelling. Pay attention to any changes in your symptoms. Moisturize your skin daily to help prevent skin from cracking and draining. Keep all follow-up visits. This is important. Contact a health care provider if: You have a fever. You have swelling in only one leg. You have increased swelling, redness, or pain in one or both of your legs. You have drainage or sores at the area where you have edema. Get help right away if: You have edema that starts suddenly or is getting worse, especially if you are pregnant or have a medical condition. You develop shortness of breath, especially when you are lying down. You have pain in your chest or abdomen. You feel weak. You feel like you will faint. These symptoms may be an emergency. Get help right away. Call 911. Do not wait to see if the symptoms will go away. Do not drive yourself to the hospital. Summary Peripheral edema is swelling that is caused by a buildup of fluid. Peripheral edema most often affects the lower legs, ankles, and feet. Move around often to prevent stiffness and to reduce swelling. Do not sit or stand for long periods of time. Pay attention to any changes in your symptoms. Contact a health care provider if you have edema that starts suddenly or is getting worse, especially if you are pregnant or have a medical condition. Get help right away if you develop shortness of breath, especially when lying down.  This information is not intended to replace advice given to you by your health care provider. Make sure you discuss any questions you have with your health care provider. Document Revised: 05/30/2021 Document Reviewed: 05/30/2021 Elsevier Patient Education  2024 [employer].

## 2024-09-01 NOTE — Progress Notes (Signed)
 Subjective:    Patient ID: Bonnie Tanner, female    DOB: 12-01-1940, 83 y.o.   MRN: [id number]   Chief Complaint: medical management of chronic issues     HPI:  Bonnie Tanner is a 83 y.o. who identifies as a female who was assigned female at birth.   Social history: Lives with: husband Work history: hairdresser   Comes in today for follow up of the following chronic medical issues:  1. ESSENTIAL HYPERTENSION, BENIGN No c/o chest pain, sob or headache. Does not check blood pressure at home. She stopped her amlodipine  because of peripheral edema.blood pressure at home is normal when she doe snot eat any extra salt. BP Readings from Last 3 Encounters:  08/21/24 (!) 147/58  07/24/24 (!) 155/56  07/02/24 (!) 161/72     2. Hyperlipidemia LDL goal <100 Does try to watch diet but does no dedicated exercise. Lab Results  Component Value Date   CHOL 137 03/06/2024   HDL 66 03/06/2024   LDLCALC 55 03/06/2024   TRIG 84 03/06/2024   CHOLHDL 2.1 03/06/2024     3. Acquired hypothyroidism No issues  that she is aware of. Lab Results  Component Value Date   TSH 2.970 08/21/2024     4. Coronary artery disease due to lipid rich plaque Does not see cardiology.  5. Gastroesophageal reflux disease without esophagitis Is on omerpazole and is doing well.   6. Malignant neoplasm of right breast in female, estrogen receptor positive, unspecified site of breast (HCC) Had breast cancer in 2012. Has had no reoccurrence.  7. BMI 32.0-32.9,adult No recent weight changes.  Wt Readings from Last 3 Encounters:  09/01/24 138 lb (62.6 kg)  08/21/24 139 lb (63 kg)  07/24/24 142 lb (64.4 kg)   BMI Readings from Last 3 Encounters:  09/01/24 27.87 kg/m  08/21/24 28.07 kg/m  07/24/24 28.68 kg/m     New complaints: None  today  Allergies  Allergen Reactions   Erythromycin  Swelling   Latex Other (See Comments)    Skin gets real red   Nickel     Other reaction(s): Other  (See Comments) blisters   Penicillins Hives   Amlodipine  Rash   Sulfonamide Derivatives Rash   Outpatient Encounter Medications as of 09/01/2024  Medication Sig   aspirin 325 MG tablet Take 325 mg by mouth daily.   atorvastatin  (LIPITOR) 40 MG tablet Take 1 tablet (40 mg total) by mouth daily.   cholecalciferol (VITAMIN D) 1000 UNITS tablet Take 1,000 Units by mouth daily.   cloNIDine  (CATAPRES ) 0.2 MG tablet Take 1 tablet (0.2 mg total) by mouth 2 (two) times daily.   furosemide  (LASIX ) 20 MG tablet TAKE 1 TABLET BY MOUTH DAILY   Glucosamine-Chondroit-Vit C-Mn (GLUCOSAMINE 1500 COMPLEX PO) Take by mouth.   levothyroxine  (SYNTHROID ) 88 MCG tablet TAKE 1 TABLET BY MOUTH DAILY  BEFORE BREAKFAST   lisinopril  (ZESTRIL ) 40 MG tablet TAKE 1 TABLET BY MOUTH DAILY   multivitamin-iron-minerals-folic acid (CENTRUM) chewable tablet Chew 1 tablet by mouth daily.   nystatin  cream (MYCOSTATIN ) Apply 1 application topically 2 (two) times daily.   omeprazole  (PRILOSEC) 40 MG capsule TAKE 1 CAPSULE BY MOUTH DAILY   verapamil  (CALAN -SR) 240 MG CR tablet Take 1 tablet (240 mg total) by mouth at bedtime.   No facility-administered encounter medications on file as of 09/01/2024.    Past Surgical History:  Procedure Laterality Date   BREAST LUMPECTOMY  04/19/2010   radiation therapy right breast   CARDIAC  CATHETERIZATION  09/2009   CATARACT EXTRACTION  2006   bilateral   EYE SURGERY  2000   made her ducts    Family History  Problem Relation Age of Onset   Heart disease Mother 70   Emphysema Father    Heart disease Father    Throat cancer Brother    Stroke Brother    Healthy Son    Prostate cancer Paternal Uncle    Stomach cancer Paternal Uncle       Controlled substance contract: n/a     Review of Systems  Constitutional:  Negative for diaphoresis.  Eyes:  Negative for pain.  Respiratory:  Negative for shortness of breath.   Cardiovascular:  Negative for chest pain, palpitations  and leg swelling.  Gastrointestinal:  Negative for abdominal pain.  Endocrine: Negative for polydipsia.  Skin:  Negative for rash.  Neurological:  Negative for dizziness, weakness and headaches.  Hematological:  Does not bruise/bleed easily.  All other systems reviewed and are negative.      Objective:   Physical Exam Vitals and nursing note reviewed.  Constitutional:      General: She is not in acute distress.    Appearance: Normal appearance. She is well-developed.  HENT:     Head: Normocephalic.     Right Ear: Tympanic membrane normal.     Left Ear: Tympanic membrane normal.     Nose: Nose normal.     Mouth/Throat:     Mouth: Mucous membranes are moist.  Eyes:     Pupils: Pupils are equal, round, and reactive to light.  Neck:     Vascular: No carotid bruit or JVD.  Cardiovascular:     Rate and Rhythm: Normal rate and regular rhythm.     Heart sounds: Normal heart sounds.  Pulmonary:     Effort: Pulmonary effort is normal. No respiratory distress.     Breath sounds: Normal breath sounds. No wheezing or rales.  Chest:     Chest wall: No tenderness.  Abdominal:     General: Bowel sounds are normal. There is no distension or abdominal bruit.     Palpations: Abdomen is soft. There is no hepatomegaly, splenomegaly, mass or pulsatile mass.     Tenderness: There is no abdominal tenderness.  Musculoskeletal:        General: Normal range of motion.     Cervical back: Normal range of motion and neck supple.  Lymphadenopathy:     Cervical: No cervical adenopathy.  Skin:    General: Skin is warm and dry.  Neurological:     Mental Status: She is alert and oriented to person, place, and time.     Deep Tendon Reflexes: Reflexes are normal and symmetric.  Psychiatric:        Behavior: Behavior normal.        Thought Content: Thought content normal.        Judgment: Judgment normal.     BP 132/64   Pulse 72   Temp (!) 97.4 F (36.3 C) (Temporal)   Ht 4' 11 (1.499 m)    Wt 138 lb (62.6 kg)   SpO2 99%   BMI 27.87 kg/m           Assessment & Plan:   [name] comes in today with chief complaint of medical management of chronic issues    Diagnosis and orders addressed:  1. ESSENTIAL HYPERTENSION, BENIGN Low sodium diet Added clonidine  0.1mg  bid - lisinopril  (ZESTRIL ) 40 MG tablet; Take  1 tablet (40 mg total) by mouth daily.  Dispense: 90 tablet; Refill: 1 - CBC with Differential/Platelet - CMP14+EGFR  2. Hyperlipidemia LDL goal <100 Low fat diet - atorvastatin  (LIPITOR) 40 MG tablet; Take 1 tablet (40 mg total) by mouth daily.  Dispense: 90 tablet; Refill: 1 - Lipid panel  3. Acquired hypothyroidism Labs pending - levothyroxine  (SYNTHROID ) 88 MCG tablet; Take 1 tablet (88 mcg total) by mouth daily before breakfast.  Dispense: 90 tablet; Refill: 1 - Thyroid  Panel With TSH  4. Coronary artery disease due to lipid rich plaque - verapamil  (CALAN ) 120 MG tablet; Take 1 tablet (120 mg total) by mouth daily.  Dispense: 90 tablet; Refill: 1  5. Gastroesophageal reflux disease without esophagitis Avoid spicy foods Do not eat 2 hours prior to bedtime - omeprazole  (PRILOSEC) 40 MG capsule; Take 1 capsule (40 mg total) by mouth daily.  Dispense: 90 capsule; Refill: 1  6. Malignant neoplasm of right breast in female, estrogen receptor positive, unspecified site of breast (HCC)   7. BMI 32.0-32.9,adult Discussed diet and exercise for person with BMI >25 Will recheck weight in 3-6 months   Labs pending Health Maintenance reviewed Diet and exercise encouraged  Follow up plan: 6 months   Mary-Margaret Gladis, FNP

## 2024-09-01 NOTE — Addendum Note (Signed)
 Addended by: GLADIS MUSTARD on: 09/01/2024 10:25 AM   Modules accepted: Orders

## 2024-09-02 ENCOUNTER — Ambulatory Visit: Payer: Self-pay | Admitting: Nurse Practitioner

## 2024-09-02 LAB — CMP14+EGFR
ALT: 24 IU/L (ref 0–32)
AST: 21 IU/L (ref 0–40)
Albumin: 4 g/dL (ref 3.7–4.7)
Alkaline Phosphatase: 129 IU/L (ref 48–129)
BUN/Creatinine Ratio: 17 (ref 12–28)
BUN: 17 mg/dL (ref 8–27)
Bilirubin Total: 0.4 mg/dL (ref 0.0–1.2)
CO2: 24 mmol/L (ref 20–29)
Calcium: 9.8 mg/dL (ref 8.7–10.3)
Chloride: 103 mmol/L (ref 96–106)
Creatinine, Ser: 0.98 mg/dL (ref 0.57–1.00)
Globulin, Total: 2.3 g/dL (ref 1.5–4.5)
Glucose: 89 mg/dL (ref 70–99)
Potassium: 4.7 mmol/L (ref 3.5–5.2)
Sodium: 138 mmol/L (ref 134–144)
Total Protein: 6.3 g/dL (ref 6.0–8.5)
eGFR: 58 mL/min/1.73 — ABNORMAL LOW (ref >59)

## 2024-09-02 LAB — CBC WITH DIFFERENTIAL/PLATELET
Basophils Absolute: 0.1 x10E3/uL (ref 0.0–0.2)
Basos: 1 %
EOS (ABSOLUTE): 0.3 x10E3/uL (ref 0.0–0.4)
Eos: 4 %
Hematocrit: 36.4 % (ref 34.0–46.6)
Hemoglobin: 12.3 g/dL (ref 11.1–15.9)
Immature Grans (Abs): 0 x10E3/uL (ref 0.0–0.1)
Immature Granulocytes: 0 %
Lymphocytes Absolute: 2.2 x10E3/uL (ref 0.7–3.1)
Lymphs: 33 %
MCH: 34 pg — ABNORMAL HIGH (ref 26.6–33.0)
MCHC: 33.8 g/dL (ref 31.5–35.7)
MCV: 101 fL — ABNORMAL HIGH (ref 79–97)
Monocytes Absolute: 0.6 x10E3/uL (ref 0.1–0.9)
Monocytes: 8 %
Neutrophils Absolute: 3.6 x10E3/uL (ref 1.4–7.0)
Neutrophils: 54 %
Platelets: 237 x10E3/uL (ref 150–450)
RBC: 3.62 x10E6/uL — ABNORMAL LOW (ref 3.77–5.28)
RDW: 12 % (ref 11.7–15.4)
WBC: 6.7 x10E3/uL (ref 3.4–10.8)

## 2024-09-02 LAB — THYROID PANEL WITH TSH
Free Thyroxine Index: 2.7 (ref 1.2–4.9)
T3 Uptake Ratio: 32 % (ref 24–39)
T4, Total: 8.5 ug/dL (ref 4.5–12.0)
TSH: 4.49 u[IU]/mL (ref 0.450–4.500)

## 2024-09-02 LAB — LIPID PANEL
Chol/HDL Ratio: 2.5 ratio (ref 0.0–4.4)
Cholesterol, Total: 132 mg/dL (ref 100–199)
HDL: 53 mg/dL (ref >39)
LDL Chol Calc (NIH): 63 mg/dL (ref 0–99)
Triglycerides: 83 mg/dL (ref 0–149)
VLDL Cholesterol Cal: 16 mg/dL (ref 5–40)

## 2025-03-03 ENCOUNTER — Ambulatory Visit: Admitting: Nurse Practitioner
